# Patient Record
Sex: Male | Born: 1945
Health system: Southern US, Community
[De-identification: ages and names within clinical notes are randomized; demographics above are authoritative.]

## PROBLEM LIST (undated history)

## (undated) DIAGNOSIS — I10 Essential (primary) hypertension: Secondary | ICD-10-CM

## (undated) DIAGNOSIS — N400 Enlarged prostate without lower urinary tract symptoms: Secondary | ICD-10-CM

## (undated) DIAGNOSIS — M1990 Unspecified osteoarthritis, unspecified site: Secondary | ICD-10-CM

## (undated) DIAGNOSIS — K649 Unspecified hemorrhoids: Secondary | ICD-10-CM

## (undated) DIAGNOSIS — K219 Gastro-esophageal reflux disease without esophagitis: Secondary | ICD-10-CM

## (undated) HISTORY — DX: Unspecified osteoarthritis, unspecified site: M19.90

## (undated) HISTORY — DX: Benign prostatic hyperplasia without lower urinary tract symptoms: N40.0

## (undated) HISTORY — DX: Essential (primary) hypertension: I10

## (undated) HISTORY — DX: Gastro-esophageal reflux disease without esophagitis: K21.9

## (undated) HISTORY — PX: EYE SURGERY: SHX253

## (undated) HISTORY — DX: Unspecified hemorrhoids: K64.9

---

## 2003-02-20 LAB — HM COLONOSCOPY

## 2007-09-02 ENCOUNTER — Ambulatory Visit: Payer: Self-pay | Admitting: Internal Medicine

## 2007-09-02 DIAGNOSIS — K219 Gastro-esophageal reflux disease without esophagitis: Secondary | ICD-10-CM | POA: Insufficient documentation

## 2007-09-03 LAB — CONVERTED CEMR LAB
LDL Cholesterol: 123 mg/dL — ABNORMAL HIGH (ref 0–99)
PSA: 2.11 ng/mL (ref 0.10–4.00)
Total CHOL/HDL Ratio: 6.1
Triglycerides: 101 mg/dL (ref 0–149)

## 2008-12-23 ENCOUNTER — Ambulatory Visit: Payer: Self-pay | Admitting: Internal Medicine

## 2008-12-23 DIAGNOSIS — R079 Chest pain, unspecified: Secondary | ICD-10-CM

## 2010-03-19 LAB — CONVERTED CEMR LAB
AST: 30 units/L (ref 0–37)
BUN: 11 mg/dL (ref 6–23)
Basophils Absolute: 0 10*3/uL (ref 0.0–0.1)
CO2: 32 meq/L (ref 19–32)
Creatinine, Ser: 1.1 mg/dL (ref 0.4–1.5)
GFR calc non Af Amer: 72.16 mL/min (ref >60)
HCT: 43.1 % (ref 39.0–52.0)
Lymphs Abs: 1.3 10*3/uL (ref 0.7–4.0)
Monocytes Absolute: 0.3 10*3/uL (ref 0.1–1.0)
Monocytes Relative: 6.3 % (ref 3.0–12.0)
PSA: 1.98 ng/mL (ref 0.10–4.00)
Platelets: 142 10*3/uL — ABNORMAL LOW (ref 150.0–400.0)
RDW: 11.8 % (ref 11.5–14.6)
Total Bilirubin: 1 mg/dL (ref 0.3–1.2)

## 2010-08-25 ENCOUNTER — Encounter: Payer: Self-pay | Admitting: Internal Medicine

## 2010-08-28 ENCOUNTER — Encounter: Payer: Self-pay | Admitting: Internal Medicine

## 2010-08-28 ENCOUNTER — Ambulatory Visit (INDEPENDENT_AMBULATORY_CARE_PROVIDER_SITE_OTHER): Payer: Managed Care, Other (non HMO) | Admitting: Internal Medicine

## 2010-08-28 DIAGNOSIS — K644 Residual hemorrhoidal skin tags: Secondary | ICD-10-CM | POA: Insufficient documentation

## 2010-08-28 DIAGNOSIS — K219 Gastro-esophageal reflux disease without esophagitis: Secondary | ICD-10-CM

## 2010-08-28 DIAGNOSIS — Z0001 Encounter for general adult medical examination with abnormal findings: Secondary | ICD-10-CM | POA: Insufficient documentation

## 2010-08-28 DIAGNOSIS — R0989 Other specified symptoms and signs involving the circulatory and respiratory systems: Secondary | ICD-10-CM | POA: Insufficient documentation

## 2010-08-28 DIAGNOSIS — Z0000 Encounter for general adult medical examination without abnormal findings: Secondary | ICD-10-CM

## 2010-08-28 DIAGNOSIS — K116 Mucocele of salivary gland: Secondary | ICD-10-CM | POA: Insufficient documentation

## 2010-08-28 DIAGNOSIS — Z2911 Encounter for prophylactic immunotherapy for respiratory syncytial virus (RSV): Secondary | ICD-10-CM

## 2010-08-28 LAB — CBC WITH DIFFERENTIAL/PLATELET
Basophils Relative: 0.6 % (ref 0.0–3.0)
Eosinophils Absolute: 0.1 10*3/uL (ref 0.0–0.7)
HCT: 41.7 % (ref 39.0–52.0)
Hemoglobin: 14.8 g/dL (ref 13.0–17.0)
MCHC: 35.5 g/dL (ref 30.0–36.0)
MCV: 87.1 fl (ref 78.0–100.0)
Monocytes Absolute: 0.4 10*3/uL (ref 0.1–1.0)
Neutro Abs: 3.6 10*3/uL (ref 1.4–7.7)
RBC: 4.79 Mil/uL (ref 4.22–5.81)

## 2010-08-28 LAB — BASIC METABOLIC PANEL
CO2: 31 mEq/L (ref 19–32)
Chloride: 101 mEq/L (ref 96–112)
Potassium: 4.1 mEq/L (ref 3.5–5.1)
Sodium: 139 mEq/L (ref 135–145)

## 2010-08-28 LAB — HEPATIC FUNCTION PANEL
ALT: 18 U/L (ref 0–53)
AST: 22 U/L (ref 0–37)
Total Protein: 7.3 g/dL (ref 6.0–8.3)

## 2010-08-28 LAB — PSA: PSA: 1.25 ng/mL (ref 0.10–4.00)

## 2010-08-28 NOTE — Assessment & Plan Note (Signed)
Will use cream prn

## 2010-08-28 NOTE — Assessment & Plan Note (Signed)
Doesn't feel overly worrisome but given his smoking status and father's history, will check ultrasound

## 2010-08-28 NOTE — Assessment & Plan Note (Signed)
Healthy Good condition  Benign mucus cyst on lip, skin tag and lipoma on buttck Discussed PSA---will do it zostavax given

## 2010-08-28 NOTE — Assessment & Plan Note (Signed)
Uses omperazole prn

## 2010-08-28 NOTE — Progress Notes (Signed)
Subjective:    Patient ID: Jonathon Chavez, male    DOB: 1945-03-18, 65 y.o.   MRN: [id number]  HPI Here with wife Concerned about lump on inside of left lower lip No pain  Has had cyst on buttock for many years  Now he has another one and he wants it checked No pain or discomfort  Concerned about AAA Dad had one He is a former smoker  Regular bowel monvements---once a week or so he may go several times a day Gets hemorrhoids acting up then No laxatives   Current Outpatient Prescriptions on File Prior to Visit  Medication Sig Dispense Refill  . DISCONTD: Multiple Vitamins-Minerals (CENTRUM SILVER PO) Take by mouth daily.          No Known Allergies  Past Medical History  Diagnosis Date  . GERD (gastroesophageal reflux disease)     No past surgical history on file.  Family History  Problem Relation Age of Onset  . Diabetes Maternal Grandmother     History   Social History  . Marital Status: Married    Spouse Name: N/A    Number of Children: 1  . Years of Education: N/A   Occupational History  . truck driver Designer, jewellery   Social History Main Topics  . Smoking status: Former Games developer  . Smokeless tobacco: Never Used  . Alcohol Use: No  . Drug Use: No  . Sexually Active: Not on file   Other Topics Concern  . Not on file   Social History Narrative  . No narrative on file   Review of Systems  Constitutional: Negative for fatigue.       Weight down 10# since last year  HENT: Negative for hearing loss, congestion, rhinorrhea, dental problem and tinnitus.   Eyes: Negative for visual disturbance.       No diplopia or focal vision loss  Respiratory: Negative for cough, chest tightness and shortness of breath.   Cardiovascular: Positive for chest pain. Negative for palpitations and leg swelling.       Has had a couple of spells of chest pain when lying on his chest in bed---better with repositioning Tries to walk regularly  Gastrointestinal: Negative for  nausea, vomiting, abdominal pain, constipation and blood in stool.       Has reflux Uses omeprazole prn or uses tums  Genitourinary: Negative for dysuria, frequency and difficulty urinating.       Nocturia 1-2 No sexual problems  Musculoskeletal: Negative for back pain, joint swelling and arthralgias.  Skin: Negative for rash.       Has the buttock cysts---close to rectum  Neurological: Negative for dizziness, syncope, weakness, light-headedness, numbness and headaches.  Hematological: Negative for adenopathy. Does not bruise/bleed easily.  Psychiatric/Behavioral: Negative for sleep disturbance and dysphoric mood. The patient is not nervous/anxious.        Objective:   Physical Exam  Constitutional: He is oriented to person, place, and time. He appears well-developed and well-nourished. No distress.  HENT:  Head: Normocephalic and atraumatic.  Right Ear: External ear normal.  Left Ear: External ear normal.  Mouth/Throat: Oropharynx is clear and moist. No oropharyngeal exudate.       3-60mm clear mucous cyst on inside of left lower lip  Eyes: Conjunctivae and EOM are normal. Pupils are equal, round, and reactive to light.  Neck: Normal range of motion. Neck supple. No thyromegaly present.  Cardiovascular: Normal rate, regular rhythm, normal heart sounds and intact distal pulses.  Exam reveals  no gallop.   No murmur heard. Pulmonary/Chest: Effort normal and breath sounds normal. No respiratory distress. He has no wheezes. He has no rales.  Abdominal: Soft. There is no tenderness.       Aorta palpable just above umbilicus on right  Genitourinary:       Hemorrhoids visible Small skin tag on right below pilonidal area Apparent lipoma on left buttock--no inflammation  Musculoskeletal: Normal range of motion. He exhibits no edema and no tenderness.  Lymphadenopathy:    He has no cervical adenopathy.  Neurological: He is alert and oriented to person, place, and time. He exhibits normal  muscle tone.       Normal strength and gait  Skin: Skin is warm. No rash noted.  Psychiatric: He has a normal mood and affect. His behavior is normal. Judgment and thought content normal.          Assessment & Plan:

## 2010-09-28 ENCOUNTER — Encounter (INDEPENDENT_AMBULATORY_CARE_PROVIDER_SITE_OTHER): Payer: Managed Care, Other (non HMO) | Admitting: Cardiology

## 2010-09-28 DIAGNOSIS — R0989 Other specified symptoms and signs involving the circulatory and respiratory systems: Secondary | ICD-10-CM

## 2010-09-28 DIAGNOSIS — Z0000 Encounter for general adult medical examination without abnormal findings: Secondary | ICD-10-CM

## 2010-09-29 ENCOUNTER — Encounter: Payer: Self-pay | Admitting: Internal Medicine

## 2012-01-09 ENCOUNTER — Ambulatory Visit (INDEPENDENT_AMBULATORY_CARE_PROVIDER_SITE_OTHER): Payer: Managed Care, Other (non HMO) | Admitting: Internal Medicine

## 2012-01-09 ENCOUNTER — Encounter: Payer: Self-pay | Admitting: Internal Medicine

## 2012-01-09 VITALS — BP 130/70 | HR 55 | Temp 97.5°F | Ht 72.0 in | Wt 188.0 lb

## 2012-01-09 DIAGNOSIS — K219 Gastro-esophageal reflux disease without esophagitis: Secondary | ICD-10-CM

## 2012-01-09 DIAGNOSIS — Z0000 Encounter for general adult medical examination without abnormal findings: Secondary | ICD-10-CM

## 2012-01-09 NOTE — Progress Notes (Signed)
  Subjective:    Patient ID: Jonathon Chavez, male    DOB: 09-10-1945, 66 y.o.   MRN: [id number]  HPI Here for physical He has no concerns No changes in FH or job  Heartburn has been quiet No longer using PPI. Will occasionally use alka seltzer (1-2 per month)  No current outpatient prescriptions on file prior to visit.    No Known Allergies  Past Medical History  Diagnosis Date  . GERD (gastroesophageal reflux disease)     No past surgical history on file.  Family History  Problem Relation Age of Onset  . Diabetes Maternal Grandmother     History   Social History  . Marital Status: Married    Spouse Name: N/A    Number of Children: 1  . Years of Education: N/A   Occupational History  . truck driver Designer, jewellery   Social History Main Topics  . Smoking status: Former Games developer  . Smokeless tobacco: Never Used  . Alcohol Use: No  . Drug Use: No  . Sexually Active: Not on file   Other Topics Concern  . Not on file   Social History Narrative  . No narrative on file   Review of Systems  Constitutional: Negative for fatigue and unexpected weight change.       No regular exercise---occ walking. Discussed Always wears seat belt  HENT: Negative for hearing loss, congestion, rhinorrhea, dental problem and tinnitus.        Regular with dentist  Eyes: Negative for visual disturbance.       No diplopia or unilateral vision loss  Respiratory: Negative for cough, chest tightness and shortness of breath.   Cardiovascular: Negative for chest pain, palpitations and leg swelling.  Gastrointestinal: Negative for nausea, vomiting, abdominal pain, constipation and blood in stool.  Genitourinary: Negative for urgency, frequency and difficulty urinating.       No sexual problems  Musculoskeletal: Negative for back pain, joint swelling and arthralgias.  Skin: Negative for rash.       No suspicious areas  Neurological: Negative for dizziness, syncope, weakness,  light-headedness, numbness and headaches.  Hematological: Negative for adenopathy. Does not bruise/bleed easily.  Psychiatric/Behavioral: Negative for sleep disturbance and dysphoric mood. The patient is not nervous/anxious.        Objective:   Physical Exam  Constitutional: He is oriented to person, place, and time. He appears well-developed and well-nourished. No distress.  HENT:  Head: Normocephalic and atraumatic.  Right Ear: External ear normal.  Left Ear: External ear normal.  Mouth/Throat: Oropharynx is clear and moist. No oropharyngeal exudate.  Eyes: Conjunctivae normal and EOM are normal. Pupils are equal, round, and reactive to light.  Neck: Normal range of motion. Neck supple. No thyromegaly present.  Cardiovascular: Normal rate, regular rhythm, normal heart sounds and intact distal pulses.  Exam reveals no gallop.   No murmur heard. Pulmonary/Chest: Effort normal and breath sounds normal. No respiratory distress. He has no wheezes. He has no rales.  Abdominal: Soft. There is no tenderness.  Musculoskeletal: Normal range of motion. He exhibits no edema and no tenderness.  Lymphadenopathy:    He has no cervical adenopathy.  Neurological: He is alert and oriented to person, place, and time.  Skin: No rash noted. No erythema.  Psychiatric: He has a normal mood and affect. His behavior is normal.          Assessment & Plan:

## 2012-01-09 NOTE — Assessment & Plan Note (Signed)
Healthy Discussed increased exercise Had insurance exam and blood work--all fine. He will get me a copy Had PSA

## 2012-01-09 NOTE — Assessment & Plan Note (Signed)
Improved Not on regular meds

## 2012-03-18 ENCOUNTER — Telehealth: Payer: Self-pay | Admitting: Internal Medicine

## 2012-03-18 ENCOUNTER — Encounter: Payer: Self-pay | Admitting: Internal Medicine

## 2012-03-18 ENCOUNTER — Encounter: Payer: Self-pay | Admitting: *Deleted

## 2012-03-18 ENCOUNTER — Ambulatory Visit (INDEPENDENT_AMBULATORY_CARE_PROVIDER_SITE_OTHER): Payer: Managed Care, Other (non HMO) | Admitting: Internal Medicine

## 2012-03-18 VITALS — BP 142/70 | HR 82 | Temp 98.1°F | Wt 189.0 lb

## 2012-03-18 DIAGNOSIS — J111 Influenza due to unidentified influenza virus with other respiratory manifestations: Secondary | ICD-10-CM

## 2012-03-18 NOTE — Telephone Encounter (Signed)
Patient Information:  Caller Name: Randa Spike  Phone: [phone]  Patient: Jonathon Chavez, Jonathon Chavez  Gender: Male  DOB: 1945-05-26  Age: 67 Years  PCP: Tillman Abide [facility])  Office Follow Up:  Does the office need to follow up with this patient?: No  Instructions For The Office: N/A   Symptoms  Reason For Call & Symptoms: Reports body aches, productive cough, nasal congestion, fever, sore throat. Patient has missed work because of this illness.  Reviewed Health History In EMR: Yes  Reviewed Medications In EMR: Yes  Reviewed Allergies In EMR: Yes  Reviewed Surgeries / Procedures: Yes  Date of Onset of Symptoms: 03/14/2012  Treatments Tried: Ibuprofen, Alkaseltzer Cold Plus  Treatments Tried Worked: Yes  Any Fever: Yes  Fever Taken: Tactile  Fever Time Of Reading: 08:32:50  Fever Last Reading: N/A  Guideline(s) Used:  Influenza - Seasonal  Disposition Per Guideline:   See Today in Office  Reason For Disposition Reached:   Using nasal washes and pain medicine > 24 hours and sinus pain (lower forehead, cheekbone, or eye) persists  Advice Given:  Call Back If:  You become short of breath or worse.  Appointment Scheduled:  03/18/2012 09:15:00 (Scheduled out of order for travel time per wife) Appointment Scheduled Provider:  Tillman Abide [facility])

## 2012-03-18 NOTE — Telephone Encounter (Signed)
See OV note.  

## 2012-03-18 NOTE — Progress Notes (Signed)
  Subjective:    Patient ID: Jonathon Chavez, male    DOB: 06-21-1945, 67 y.o.   MRN: [id number]  HPI Here with wife Had to call in today--needs note Started feeling sick 4 days ago Worked sick 1/25 but came home and had to go to bed 2 days in bed since  No clear fever but gets hot and cold Bad body aches-- ibuprofen not clearly helpful Very sore throat Some cough-- dry No SOB No ear pain Some headaches  Tried alka seltzer cold med---not sure if it is doing any good Halls throat lozenges  No current outpatient prescriptions on file prior to visit.    No Known Allergies  Past Medical History  Diagnosis Date  . GERD (gastroesophageal reflux disease)     No past surgical history on file.  Family History  Problem Relation Age of Onset  . Diabetes Maternal Grandmother     History   Social History  . Marital Status: Married    Spouse Name: N/A    Number of Children: 1  . Years of Education: N/A   Occupational History  . truck driver Designer, jewellery   Social History Main Topics  . Smoking status: Former Games developer  . Smokeless tobacco: Never Used  . Alcohol Use: No  . Drug Use: No  . Sexually Active: Not on file   Other Topics Concern  . Not on file   Social History Narrative  . No narrative on file   Review of Systems No vomiting or diarrhea Appetite is off but is eating some No rash    Objective:   Physical Exam  Constitutional: He appears well-developed and well-nourished. No distress.  HENT:  Mouth/Throat: Oropharynx is clear and moist. No oropharyngeal exudate.       No sinus tenderness TMs normal Moderate nasal inflammation  Neck: Normal range of motion. Neck supple.       Non tender cervical nodes bilaterally  Pulmonary/Chest: Effort normal and breath sounds normal. No respiratory distress. He has no wheezes. He has no rales.  Lymphadenopathy:    He has cervical adenopathy.  Skin: No rash noted.          Assessment & Plan:

## 2012-03-18 NOTE — Assessment & Plan Note (Signed)
Fairly classic case Discussed supportive care----ibuprofen, honey, Vicks tamiflu not indicated Work note done

## 2012-09-19 HISTORY — PX: CHOLECYSTECTOMY: SHX55

## 2012-10-16 ENCOUNTER — Inpatient Hospital Stay: Payer: Self-pay | Admitting: Surgery

## 2012-10-16 LAB — COMPREHENSIVE METABOLIC PANEL
Albumin: 4.1 g/dL (ref 3.4–5.0)
Anion Gap: 7 (ref 7–16)
Bilirubin,Total: 0.7 mg/dL (ref 0.2–1.0)
Chloride: 103 mmol/L (ref 98–107)
Co2: 28 mmol/L (ref 21–32)
Creatinine: 1 mg/dL (ref 0.60–1.30)
EGFR (African American): >60
EGFR (Non-African Amer.): >60
Glucose: 144 mg/dL — ABNORMAL HIGH (ref 65–99)
Potassium: 4 mmol/L (ref 3.5–5.1)
SGOT(AST): 21 U/L (ref 15–37)
SGPT (ALT): 28 U/L (ref 12–78)
Sodium: 138 mmol/L (ref 136–145)
Total Protein: 7.7 g/dL (ref 6.4–8.2)

## 2012-10-16 LAB — URINALYSIS, COMPLETE
Bacteria: NONE SEEN
Bilirubin,UR: NEGATIVE
Leukocyte Esterase: NEGATIVE
Nitrite: NEGATIVE
Ph: 6 (ref 4.5–8.0)
Protein: NEGATIVE
Specific Gravity: 1.005 (ref 1.003–1.030)
Squamous Epithelial: NONE SEEN
WBC UR: 1 /HPF (ref 0–5)

## 2012-10-16 LAB — CBC WITH DIFFERENTIAL/PLATELET
Basophil #: 0 10*3/uL (ref 0.0–0.1)
Eosinophil #: 0.1 10*3/uL (ref 0.0–0.7)
HCT: 42.4 % (ref 40.0–52.0)
Lymphocyte #: 1.5 10*3/uL (ref 1.0–3.6)
Lymphocyte %: 18.2 %
MCV: 83 fL (ref 80–100)
Monocyte #: 0.5 x10 3/mm (ref 0.2–1.0)
Monocyte %: 6 %
Neutrophil %: 74.2 %
RBC: 5.12 10*6/uL (ref 4.40–5.90)
WBC: 8.2 10*3/uL (ref 3.8–10.6)

## 2012-10-16 LAB — PROTIME-INR
INR: 1
Prothrombin Time: 13.6 secs (ref 11.5–14.7)

## 2012-10-16 LAB — CK TOTAL AND CKMB (NOT AT ARMC): CK, Total: 105 U/L (ref 35–232)

## 2012-10-22 LAB — PATHOLOGY REPORT

## 2013-02-06 ENCOUNTER — Encounter: Payer: Self-pay | Admitting: Internal Medicine

## 2013-02-06 ENCOUNTER — Ambulatory Visit (INDEPENDENT_AMBULATORY_CARE_PROVIDER_SITE_OTHER): Payer: Managed Care, Other (non HMO) | Admitting: Internal Medicine

## 2013-02-06 VITALS — BP 120/80 | HR 79 | Temp 97.8°F | Ht 72.0 in | Wt 190.0 lb

## 2013-02-06 DIAGNOSIS — Z23 Encounter for immunization: Secondary | ICD-10-CM

## 2013-02-06 DIAGNOSIS — Z0000 Encounter for general adult medical examination without abnormal findings: Secondary | ICD-10-CM

## 2013-02-06 DIAGNOSIS — Z1211 Encounter for screening for malignant neoplasm of colon: Secondary | ICD-10-CM

## 2013-02-06 NOTE — Addendum Note (Signed)
Addended by: Sueanne Margarita on: 02/06/2013 10:20 AM   Modules accepted: Orders

## 2013-02-06 NOTE — Assessment & Plan Note (Addendum)
Healthy Counseling done Will give pneumovax, Tdap Due for colonoscopy No labs this year--PSA and glucose normal last year Cholesterol levels are good

## 2013-02-06 NOTE — Patient Instructions (Signed)
Exercise to Stay Healthy Exercise helps you become and stay healthy. EXERCISE IDEAS AND TIPS Choose exercises that:  You enjoy.  Fit into your day. You do not need to exercise really hard to be healthy. You can do exercises at a slow or medium level and stay healthy. You can:  Stretch before and after working out.  Try yoga, Pilates, or tai chi.  Lift weights.  Walk fast, swim, jog, run, climb stairs, bicycle, dance, or rollerskate.  Take aerobic classes. Exercises that burn about 150 calories:  Running 1  miles in 15 minutes.  Playing volleyball for 45 to 60 minutes.  Washing and waxing a car for 45 to 60 minutes.  Playing touch football for 45 minutes.  Walking 1  miles in 35 minutes.  Pushing a stroller 1  miles in 30 minutes.  Playing basketball for 30 minutes.  Raking leaves for 30 minutes.  Bicycling 5 miles in 30 minutes.  Walking 2 miles in 30 minutes.  Dancing for 30 minutes.  Shoveling snow for 15 minutes.  Swimming laps for 20 minutes.  Walking up stairs for 15 minutes.  Bicycling 4 miles in 15 minutes.  Gardening for 30 to 45 minutes.  Jumping rope for 15 minutes.  Washing windows or floors for 45 to 60 minutes. Document Released: 03/10/2010 Document Revised: 04/30/2011 Document Reviewed: 03/10/2010 [employer] Patient Information 2014 [employer].  

## 2013-02-06 NOTE — Progress Notes (Signed)
Pre-visit discussion using our clinic review tool. No additional management support is needed unless otherwise documented below in the visit note.  

## 2013-02-06 NOTE — Progress Notes (Signed)
Subjective:    Patient ID: Jonathon Chavez, male    DOB: 1945/06/01, 67 y.o.   MRN: [id number]  HPI Here for physical Wife is here No new concerns Did have cholecystectomy in August--had slight post op bleeding when he pushed things Still working  No current outpatient prescriptions on file prior to visit.   No current facility-administered medications on file prior to visit.    No Known Allergies  Past Medical History  Diagnosis Date  . GERD (gastroesophageal reflux disease)     Past Surgical History  Procedure Laterality Date  . Cholecystectomy  8/14    Dr Michela Pitcher    Family History  Problem Relation Age of Onset  . Diabetes Maternal Grandmother     History   Social History  . Marital Status: Married    Spouse Name: N/A    Number of Children: 1  . Years of Education: N/A   Occupational History  . truck driver Designer, jewellery   Social History Main Topics  . Smoking status: Former Games developer  . Smokeless tobacco: Never Used  . Alcohol Use: No  . Drug Use: No  . Sexual Activity: Not on file   Other Topics Concern  . Not on file   Social History Narrative   No living will   Wife would be health care POA   Would accept resuscitation--no prolonged ventilation   Probably no tube feeds if cognitively unaware   Review of Systems  Constitutional: Negative for fatigue and unexpected weight change.       Wears seat belt  HENT: Negative for congestion, dental problem, hearing loss, rhinorrhea and tinnitus.        Regular with dentist  Eyes: Negative for visual disturbance.       No diplopia or unilateral vision loss  Respiratory: Negative for cough, chest tightness and shortness of breath.   Cardiovascular: Negative for chest pain, palpitations and leg swelling.       No regular exercise  Gastrointestinal: Negative for nausea, vomiting, abdominal pain and constipation.       Hemorrhoidal bleed occasionally Occasional indigestion-- uses tums or alka seltzer.  Better since cholecystectomy  Endocrine: Negative for cold intolerance and heat intolerance.  Genitourinary: Positive for urgency and frequency. Negative for dysuria and difficulty urinating.       Nocturia x 2-3 Some increased frquency in day No sexual problems  Musculoskeletal: Positive for arthralgias and back pain. Negative for joint swelling.       Occasional low back or right shoulder pain---uses aleve prn  Skin: Negative for rash.       No suspicious lesions  Allergic/Immunologic: Negative for environmental allergies and immunocompromised state.  Neurological: Negative for dizziness, syncope, weakness, light-headedness, numbness and headaches.  Hematological: Negative for adenopathy. Does not bruise/bleed easily.  Psychiatric/Behavioral: Negative for sleep disturbance and dysphoric mood. The patient is not nervous/anxious.        Objective:   Physical Exam  Constitutional: He is oriented to person, place, and time. He appears well-developed and well-nourished. No distress.  HENT:  Head: Normocephalic and atraumatic.  Right Ear: External ear normal.  Left Ear: External ear normal.  Mouth/Throat: Oropharynx is clear and moist. No oropharyngeal exudate.  Eyes: Conjunctivae and EOM are normal. Pupils are equal, round, and reactive to light.  Neck: Normal range of motion. Neck supple. No thyromegaly present.  Cardiovascular: Normal rate, regular rhythm, normal heart sounds and intact distal pulses.  Exam reveals no gallop.   No murmur  heard. Pulmonary/Chest: Effort normal and breath sounds normal. No respiratory distress. He has no wheezes. He has no rales.  Abdominal: Soft. There is no tenderness.  Musculoskeletal: He exhibits no edema and no tenderness.  Lymphadenopathy:    He has no cervical adenopathy.  Neurological: He is alert and oriented to person, place, and time.  Skin: No rash noted. No erythema.  Psychiatric: He has a normal mood and affect. His behavior is normal.            Assessment & Plan:

## 2013-02-20 ENCOUNTER — Encounter: Payer: Self-pay | Admitting: Internal Medicine

## 2013-03-22 HISTORY — PX: COLONOSCOPY: SHX174

## 2013-03-25 ENCOUNTER — Ambulatory Visit (AMBULATORY_SURGERY_CENTER): Payer: Managed Care, Other (non HMO)

## 2013-03-25 VITALS — Ht 73.5 in | Wt 197.2 lb

## 2013-03-25 DIAGNOSIS — Z1211 Encounter for screening for malignant neoplasm of colon: Secondary | ICD-10-CM

## 2013-03-25 MED ORDER — MOVIPREP 100 G PO SOLR: ORAL | Status: DC

## 2013-03-27 ENCOUNTER — Encounter: Payer: Self-pay | Admitting: Internal Medicine

## 2013-04-08 ENCOUNTER — Encounter: Payer: Self-pay | Admitting: Internal Medicine

## 2013-04-08 ENCOUNTER — Ambulatory Visit (AMBULATORY_SURGERY_CENTER): Payer: Managed Care, Other (non HMO) | Admitting: Internal Medicine

## 2013-04-08 VITALS — BP 130/90 | HR 76 | Temp 97.2°F | Resp 19 | Ht 73.5 in | Wt 197.0 lb

## 2013-04-08 DIAGNOSIS — D126 Benign neoplasm of colon, unspecified: Secondary | ICD-10-CM

## 2013-04-08 DIAGNOSIS — Z1211 Encounter for screening for malignant neoplasm of colon: Secondary | ICD-10-CM

## 2013-04-08 MED ORDER — SODIUM CHLORIDE 0.9 % IV SOLN: 500.0000 mL | INTRAVENOUS | Status: DC

## 2013-04-08 NOTE — Patient Instructions (Signed)
YOU HAD AN ENDOSCOPIC PROCEDURE TODAY AT THE [facility]: Refer to the procedure report that was given to you for any specific questions about what was found during the examination.  If the procedure report does not answer your questions, please call your gastroenterologist to clarify.  If you requested that your care partner not be given the details of your procedure findings, then the procedure report has been included in a sealed envelope for you to review at your convenience later.  YOU SHOULD EXPECT: Some feelings of bloating in the abdomen. Passage of more gas than usual.  Walking can help get rid of the air that was put into your GI tract during the procedure and reduce the bloating. If you had a lower endoscopy (such as a colonoscopy or flexible sigmoidoscopy) you may notice spotting of blood in your stool or on the toilet paper. If you underwent a bowel prep for your procedure, then you may not have a normal bowel movement for a few days.  DIET: Your first meal following the procedure should be a light meal and then it is ok to progress to your normal diet.  A half-sandwich or bowl of soup is an example of a good first meal.  Heavy or fried foods are harder to digest and may make you feel nauseous or bloated.  Likewise meals heavy in dairy and vegetables can cause extra gas to form and this can also increase the bloating.  Drink plenty of fluids but you should avoid alcoholic beverages for 24 hours.  ACTIVITY: Your care partner should take you home directly after the procedure.  You should plan to take it easy, moving slowly for the rest of the day.  You can resume normal activity the day after the procedure however you should NOT DRIVE or use heavy machinery for 24 hours (because of the sedation medicines used during the test).    SYMPTOMS TO REPORT IMMEDIATELY: A gastroenterologist can be reached at any hour.  During normal business hours, 8:30 AM to 5:00 PM Monday through Friday,  call [phone].  After hours and on weekends, please call the GI answering service at [phone] who will take a message and have the physician on call contact you.   Following lower endoscopy (colonoscopy or flexible sigmoidoscopy):  Excessive amounts of blood in the stool  Significant tenderness or worsening of abdominal pains  Swelling of the abdomen that is new, acute  Fever of 100F or higher   FOLLOW UP: If any biopsies were taken you will be contacted by phone or by letter within the next 1-3 weeks.  Call your gastroenterologist if you have not heard about the biopsies in 3 weeks.  Our staff will call the home number listed on your records the next business day following your procedure to check on you and address any questions or concerns that you may have at that time regarding the information given to you following your procedure. This is a courtesy call and so if there is no answer at the home number and we have not heard from you through the emergency physician on call, we will assume that you have returned to your regular daily activities without incident.  SIGNATURES/CONFIDENTIALITY: You and/or your care partner have signed paperwork which will be entered into your electronic medical record.  These signatures attest to the fact that that the information above on your After Visit Summary has been reviewed and is understood.  Full responsibility of the confidentiality of   this discharge information lies with you and/or your care-partner.   Resume medications. Information given on polyps,diverticulosis and high fiber diet with discharge instructions. 

## 2013-04-08 NOTE — Progress Notes (Signed)
Called to room to assist during endoscopic procedure.  Patient ID and intended procedure confirmed with present staff. Received instructions for my participation in the procedure from the performing physician.  

## 2013-04-08 NOTE — Progress Notes (Signed)
Report to pacu rn, vss, bbs=clear 

## 2013-04-08 NOTE — Op Note (Signed)
[facility]  [address] [place], [id number]   COLONOSCOPY PROCEDURE REPORT  PATIENT: Jonathon Chavez, Poss  MR#: [id number] BIRTHDATE: Oct 07, 1945 , 67  yrs. old GENDER: Male ENDOSCOPIST: Jerene Bears, MD REFERRED [id number] Silvio Pate, M.D. PROCEDURE DATE:  04/08/2013 PROCEDURE:   Colonoscopy with cold biopsy polypectomy First Screening Colonoscopy - Avg.  risk and is 50 yrs.  old or older - No.  Prior Negative Screening - Now for repeat screening. 10 or more years since last screening  History of Adenoma - Now for follow-up colonoscopy & has been > or = to 3 yrs.  N/A  Polyps Removed Today? Yes. ASA CLASS:   Class II INDICATIONS:average risk screening and Last colonoscopy performed 10 years ago. MEDICATIONS: MAC sedation, administered by CRNA and propofol (Diprivan) 300mg  IV  DESCRIPTION OF PROCEDURE:   After the risks benefits and alternatives of the procedure were thoroughly explained, informed consent was obtained.  A digital rectal exam revealed no rectal mass.   The LB [id number]  endoscope was introduced through the anus and advanced to the cecum, which was identified by both the appendix and ileocecal valve. No adverse events experienced. The quality of the prep was good, using MoviPrep  The instrument was then slowly withdrawn as the colon was fully examined.   COLON FINDINGS: A sessile polyp measuring 4 mm in size was found in the ascending colon.  A polypectomy was performed with cold forceps.  The resection was complete and the polyp tissue was completely retrieved.   There was mild diverticulosis noted in the ascending colon and moderate in the descending colon, and sigmoid colon with associated muscular hypertrophy.  Retroflexed views revealed internal hemorrhoids. The time to cecum=3 minutes 45 seconds.  Withdrawal time=10 minutes 51 seconds.  The scope was withdrawn and the procedure completed. COMPLICATIONS: There were no  complications.  ENDOSCOPIC IMPRESSION: 1.   Sessile polyp measuring 4 mm in size was found in the ascending colon; polypectomy was performed with cold forceps 2.   There was moderate diverticulosis noted in the ascending colon, descending colon, and sigmoid colon  RECOMMENDATIONS: 1.  Await pathology results 2.  High fiber diet 3.  If the polyp removed today is proven to be an adenomatous (pre-cancerous) polyp, you will need a repeat colonoscopy in 5 years.  Otherwise you should continue to follow colorectal cancer screening guidelines for "routine risk" patients with colonoscopy in 10 years.  You will receive a letter within 1-2 weeks with the results of your biopsy as well as final recommendations.  Please call my office if you have not received a letter after 3 weeks.   eSigned:  Jerene Bears, MD 04/08/2013 12:27 PM   cc: The Patient and Venia Carbon, MD   PATIENT NAME:  [name], [name] MR#: [id number]

## 2013-04-09 ENCOUNTER — Telehealth: Payer: Self-pay

## 2013-04-09 NOTE — Telephone Encounter (Signed)
  Follow up Call-  Call back number 04/08/2013  Post procedure Call Back phone  # [phone]  Permission to leave phone message Yes     Patient questions:  Do you have a fever, pain , or abdominal swelling? no Pain Score  0 *  Have you tolerated food without any problems? yes  Have you been able to return to your normal activities? yes  Do you have any questions about your discharge instructions: Diet   no Medications  no Follow up visit  no  Do you have questions or concerns about your Care? no  Actions: * If pain score is 4 or above: No action needed, pain <4.

## 2013-04-15 ENCOUNTER — Encounter: Payer: Self-pay | Admitting: Internal Medicine

## 2014-02-08 ENCOUNTER — Ambulatory Visit (INDEPENDENT_AMBULATORY_CARE_PROVIDER_SITE_OTHER): Payer: Managed Care, Other (non HMO) | Admitting: Internal Medicine

## 2014-02-08 ENCOUNTER — Encounter: Payer: Self-pay | Admitting: Internal Medicine

## 2014-02-08 VITALS — BP 130/80 | HR 65 | Temp 98.2°F | Ht 74.0 in | Wt 192.0 lb

## 2014-02-08 DIAGNOSIS — K219 Gastro-esophageal reflux disease without esophagitis: Secondary | ICD-10-CM

## 2014-02-08 DIAGNOSIS — Z23 Encounter for immunization: Secondary | ICD-10-CM

## 2014-02-08 DIAGNOSIS — N400 Enlarged prostate without lower urinary tract symptoms: Secondary | ICD-10-CM

## 2014-02-08 DIAGNOSIS — Z0000 Encounter for general adult medical examination without abnormal findings: Secondary | ICD-10-CM

## 2014-02-08 LAB — LIPID PANEL
CHOLESTEROL: 178 mg/dL (ref 0–200)
HDL: 28.3 mg/dL — AB (ref >39.00)
LDL CALC: 127 mg/dL — AB (ref 0–99)
NonHDL: 149.7
Total CHOL/HDL Ratio: 6
Triglycerides: 114 mg/dL (ref 0.0–149.0)
VLDL: 22.8 mg/dL (ref 0.0–40.0)

## 2014-02-08 LAB — GLUCOSE, RANDOM: Glucose, Bld: 100 mg/dL — ABNORMAL HIGH (ref 70–99)

## 2014-02-08 NOTE — Progress Notes (Signed)
Subjective:    Patient ID: Jonathon Chavez, male    DOB: 05-Jul-1945, 68 y.o.   MRN: [id number]  HPI Here for physical Still insured through company---full time worker still  Dad has Alzheimers---rotates with 2 brothers (someone is always there with him)   Wants to get PSA---family history of other cancers Needs blood work for his company also  No other concerns  Current Outpatient Prescriptions on File Prior to Visit  Medication Sig Dispense Refill  . naproxen sodium (ANAPROX) 220 MG tablet Take 220 mg by mouth as needed.     No current facility-administered medications on file prior to visit.    No Known Allergies  Past Medical History  Diagnosis Date  . GERD (gastroesophageal reflux disease)   . Hemorrhoids     Past Surgical History  Procedure Laterality Date  . Cholecystectomy  8/14    Dr Pat Patrick    Family History  Problem Relation Age of Onset  . Diabetes Maternal Grandmother   . Dementia Father   . Cancer Mother     ovarian cancer  . Cancer Brother     throat cancer  . Cancer Brother     prostate cancer    History   Social History  . Marital Status: Married    Spouse Name: N/A    Number of Children: 1  . Years of Education: N/A   Occupational History  . truck driver Public house manager   Social History Main Topics  . Smoking status: Former Smoker    Quit date: 03/25/1973  . Smokeless tobacco: Never Used  . Alcohol Use: No  . Drug Use: No  . Sexual Activity: Not on file   Other Topics Concern  . Not on file   Social History Narrative   No living will   Wife would be health care POA   Would accept resuscitation--no prolonged ventilation   Probably no tube feeds if cognitively unaware   Review of Systems  Constitutional: Negative for fatigue and unexpected weight change.       Wears seat belt  HENT: Negative for dental problem, hearing loss and tinnitus.        Overdue for dentist  Eyes: Negative for visual disturbance.       No diplopia or  unilateral vision loss  Respiratory: Negative for cough, chest tightness and shortness of breath.   Cardiovascular: Negative for chest pain, palpitations and leg swelling.  Gastrointestinal: Negative for nausea and vomiting.       Occasional indigestion---tums prn (if he eats before going to bed) Used H2 blocker in past  Endocrine: Negative for cold intolerance and heat intolerance.  Genitourinary: Positive for frequency. Negative for urgency.       Every 2 hours nocturia Some daytime frequency No major urgency No sexual problems  Musculoskeletal: Positive for arthralgias. Negative for back pain and joint swelling.       Joints ache in cold weather--aleve helps (but causes hemorrhoids to bleed)  Skin: Negative for rash.       No suspicious lesions  Allergic/Immunologic: Negative for environmental allergies and immunocompromised state.  Neurological: Negative for dizziness, syncope, weakness, light-headedness, numbness and headaches.  Hematological: Negative for adenopathy. Does not bruise/bleed easily.  Psychiatric/Behavioral: Negative for sleep disturbance and dysphoric mood. The patient is not nervous/anxious.        Objective:   Physical Exam  Constitutional: He is oriented to person, place, and time. He appears well-developed and well-nourished. No distress.  HENT:  Head:  Normocephalic and atraumatic.  Right Ear: External ear normal.  Left Ear: External ear normal.  Mouth/Throat: Oropharynx is clear and moist. No oropharyngeal exudate.  Eyes: Conjunctivae and EOM are normal. Pupils are equal, round, and reactive to light.  Neck: Normal range of motion. Neck supple. No thyromegaly present.  Cardiovascular: Normal rate, regular rhythm, normal heart sounds and intact distal pulses.  Exam reveals no gallop.   No murmur heard. Pulmonary/Chest: Effort normal and breath sounds normal. No respiratory distress. He has no wheezes. He has no rales.  Abdominal: Soft. There is no  tenderness.  Musculoskeletal: He exhibits no edema or tenderness.  Lymphadenopathy:    He has no cervical adenopathy.  Neurological: He is alert and oriented to person, place, and time.  Skin: No rash noted. No erythema.  Psychiatric: He has a normal mood and affect. His behavior is normal.          Assessment & Plan:

## 2014-02-08 NOTE — Assessment & Plan Note (Signed)
Doing well Discussed regular exercise prevnar today Discussed PSA--will check Check glucose and lipid

## 2014-02-08 NOTE — Addendum Note (Signed)
Addended by: Despina Hidden on: 02/08/2014 03:50 PM   Modules accepted: Orders

## 2014-02-08 NOTE — Patient Instructions (Signed)
If you need the Tums more often, consider using ranitidine or famotidine (zantac and pepcid over the counter) daily at bedtime

## 2014-02-08 NOTE — Progress Notes (Signed)
Pre visit review using our clinic review tool, if applicable. No additional management support is needed unless otherwise documented below in the visit note. 

## 2014-02-08 NOTE — Assessment & Plan Note (Signed)
Uses tums at times Discussed H2 blocker if more symptoms

## 2014-02-08 NOTE — Assessment & Plan Note (Signed)
Bothersome but not bad enough for meds

## 2014-02-09 LAB — PSA: PSA: 2.5 ng/mL (ref 0.10–4.00)

## 2014-02-11 ENCOUNTER — Encounter: Payer: Self-pay | Admitting: *Deleted

## 2014-06-11 NOTE — Op Note (Signed)
PATIENT NAME:  Jonathon Chavez, Jonathon Chavez MR#:  [id number] DATE OF BIRTH:  1945/09/27  DATE OF PROCEDURE:  10/16/2012  PREOPERATIVE DIAGNOSIS: Acute cholecystitis.   POSTOPERATIVE DIAGNOSIS: Acute cholecystitis.  PROCEDURE PERFORMED: Laparoscopic cholecystectomy.   ANESTHESIA: General.   ESTIMATED BLOOD LOSS: 25 mL.   COMPLICATIONS: None.   SPECIMEN: Gallbladder.   INDICATION FOR SURGERY: [name] is a pleasant 69 year old male who presents with acute onset epigastric right upper quadrant pain and ultrasound which was concerning for a stone at the neck of the gallbladder, he thus was brought to the operating room suite for laparoscopic cholecystectomy.   DETAILS OF PROCEDURE: Is as follows:  Informed consent was obtained. [name]. He was placed supine on the operating room table. He was induced. Endotracheal tube was placed, general anesthesia was administered. His abdomen was then prepped and draped in standard surgical fashion. A timeout was then performed correctly identifying patient name, operative site and procedure to be performed. A supraumbilical incision was made. This was deepened down to the fascia. The fascia was incised. The peritoneum was entered. Two stay sutures were placed with 0 Vicryl suture through the fasciotomy. Hasson trocar was placed in the abdomen. The abdomen was insufflated. An 11 mm epigastric and two 5 mm subcostal trocars were placed at the midclavicular and anterior axillary line. The gallbladder was then retracted over the dome of the liver. The cystic artery and cystic duct were dissected out. Critical view was obtained. There were clipped 3 times each and ligated. The gallbladder was then taken off the gallbladder fossa using hook electrocautery. It was then taken out through an Endo Catch bag through the supraumbilical port site. The gallbladder fossa was then examined. It was made hemostatic. The abdomen was irrigated. I then  looked at the gallbladder fossa again and ensured that it was hemostatic. I then removed all trocars under direct visualization. The supraumbilical incision was closed with a figure-of-eight 0 Vicryl. The skin sites were then closed with an inverted deep dermal 4-0 Monocryl. Steri-Strips, Telfa gauze and Tegaderm were then used to complete the dressing. [name] was then awoken, extubated and brought to the postanesthesia care unit. There were no immediate complications. The needle, sponge and instrument count was correct at the end of the procedure.    ____________________________ Glena Norfolk. , MD cal:dmm D: 10/17/2012 09:47:05 ET T: 10/17/2012 10:16:01 ET JOB#: [id number]  cc: Harrell Gave A. , MD, <Dictator> Floyde Parkins MD ELECTRONICALLY SIGNED 10/24/2012 8:12

## 2014-06-11 NOTE — H&P (Signed)
PATIENT NAME:  Jonathon Chavez, Jonathon Chavez MR#:  [id number] DATE OF BIRTH:  October 09, 1945  DATE OF ADMISSION:  10/16/2012  CHIEF COMPLAINT:  Epigastric pain.   HISTORY OF PRESENT ILLNESS:  This is a patient with a history of known reflux disease who does not take any medications for it who presented with the acute onset of abdominal pain in the epigastrium, started last night after eating a fatty meal. He has nausea and has vomited one time. He has been medicated in the ER for pain and his pain is better, but still present. He has no radiation of his pain to his shoulder or back.   It is not clear if he has had pain similar to this as he has reflux symptoms quite often.   Denies fevers, chills, jaundice or acholic stools.   PAST MEDICAL HISTORY:  None with the exception of reflux disease.   PAST SURGICAL HISTORY:  None.   ALLERGIES:  None.   MEDICATIONS:  Currently none, not being medicated for reflux disease routinely.   FAMILY HISTORY:  Multiple members with gallbladder disease.   SOCIAL HISTORY:  The patient is a Administrator. He does not smoke or drink.   REVIEW OF SYSTEMS:  A 10-system review has been performed and negative with the exception of that mentioned in the HPI.   PHYSICAL EXAMINATION:  GENERAL:  Uncomfortable-appearing Caucasian male patient.  HEENT:  No scleral icterus.  NECK:  No palpable neck nodes.  CHEST:  Clear to auscultation.  CARDIAC:  Regular rate and rhythm.  ABDOMEN:  Soft. There is a positive Murphy's sign. He is tender in the right upper quadrant with no hernias noted.  EXTREMITIES:  Without edema.  NEUROLOGIC:  Grossly intact.  INTEGUMENT:  No jaundice.  VITAL SIGNS:  Stable. He is afebrile.  An ultrasound and a CT scan as reported by the Emergency Room physician show stones. These will be personally reviewed.   LABORATORY, DIAGNOSTIC AND RADIOLOGICAL DATA:  Laboratory values were reported to me by the Emergency Room physician as being normal white blood cell  count and normal liver function tests. These are currently not available, but will be reviewed personally once they are available.   ASSESSMENT AND PLAN:  This is a patient with likely acute cholecystitis and known gallstones. He also has reflux disease. With these findings it and the unrelenting pain, he has acute cholecystitis and will be admitted to the hospital. The rationale for admission and hydration and starting IV antibiotics has been discussed with he and his family. The options of outpatient management have been discussed and the need for probable laparoscopic cholecystectomy in the next 24 or 48 hours has been discussed. If his pain resolved completely he could be sent home and elective surgery could be performed. The procedure itself was has been described. The risks of bleeding, infection, recurrence of symptoms, the failure to resolve all of his symptoms, especially with his reflux disease and the risks of conversion to an open procedure, bile duct damage, bile duct leak, retained common bile duct stone, any of which could require further surgery and/or ERCP, stent, and papillotomy were discussed with he and his family. They understood and agreed to proceed.   ____________________________ Jerrol Banana Burt Knack, MD rec:jm D: 10/16/2012 09:42:20 ET T: 10/16/2012 09:53:16 ET JOB#: [id number]  cc: Jerrol Banana. Burt Knack, MD, <Dictator> Florene Glen MD ELECTRONICALLY SIGNED 10/20/2012 19:13

## 2015-02-15 ENCOUNTER — Encounter: Payer: Self-pay | Admitting: Internal Medicine

## 2015-02-15 ENCOUNTER — Ambulatory Visit (INDEPENDENT_AMBULATORY_CARE_PROVIDER_SITE_OTHER): Payer: Managed Care, Other (non HMO) | Admitting: Internal Medicine

## 2015-02-15 VITALS — BP 120/60 | HR 70 | Temp 98.1°F | Ht 74.0 in | Wt 193.0 lb

## 2015-02-15 DIAGNOSIS — N400 Enlarged prostate without lower urinary tract symptoms: Secondary | ICD-10-CM | POA: Diagnosis not present

## 2015-02-15 DIAGNOSIS — Z0000 Encounter for general adult medical examination without abnormal findings: Secondary | ICD-10-CM

## 2015-02-15 DIAGNOSIS — K219 Gastro-esophageal reflux disease without esophagitis: Secondary | ICD-10-CM | POA: Diagnosis not present

## 2015-02-15 LAB — COMPREHENSIVE METABOLIC PANEL
ALBUMIN: 4.2 g/dL (ref 3.5–5.2)
ALK PHOS: 44 U/L (ref 39–117)
ALT: 31 U/L (ref 0–53)
AST: 25 U/L (ref 0–37)
BUN: 16 mg/dL (ref 6–23)
CHLORIDE: 102 meq/L (ref 96–112)
CO2: 32 mEq/L (ref 19–32)
CREATININE: 0.95 mg/dL (ref 0.40–1.50)
Calcium: 9.6 mg/dL (ref 8.4–10.5)
GFR: 83.83 mL/min (ref >60.00)
Glucose, Bld: 103 mg/dL — ABNORMAL HIGH (ref 70–99)
Potassium: 4.3 mEq/L (ref 3.5–5.1)
SODIUM: 140 meq/L (ref 135–145)
Total Bilirubin: 0.7 mg/dL (ref 0.2–1.2)
Total Protein: 7.2 g/dL (ref 6.0–8.3)

## 2015-02-15 LAB — CBC WITH DIFFERENTIAL/PLATELET
BASOS PCT: 0.7 % (ref 0.0–3.0)
Basophils Absolute: 0 10*3/uL (ref 0.0–0.1)
EOS ABS: 0.2 10*3/uL (ref 0.0–0.7)
Eosinophils Relative: 2.9 % (ref 0.0–5.0)
HCT: 45.4 % (ref 39.0–52.0)
Hemoglobin: 15.2 g/dL (ref 13.0–17.0)
Lymphocytes Relative: 29.9 % (ref 12.0–46.0)
Lymphs Abs: 1.6 10*3/uL (ref 0.7–4.0)
MCHC: 33.5 g/dL (ref 30.0–36.0)
MCV: 85.8 fl (ref 78.0–100.0)
MONO ABS: 0.4 10*3/uL (ref 0.1–1.0)
Monocytes Relative: 7.4 % (ref 3.0–12.0)
NEUTROS PCT: 59.1 % (ref 43.0–77.0)
Neutro Abs: 3.1 10*3/uL (ref 1.4–7.7)
Platelets: 161 10*3/uL (ref 150.0–400.0)
RBC: 5.29 Mil/uL (ref 4.22–5.81)
RDW: 13.7 % (ref 11.5–15.5)
WBC: 5.2 10*3/uL (ref 4.0–10.5)

## 2015-02-15 LAB — PSA: PSA: 2.31 ng/mL (ref 0.10–4.00)

## 2015-02-15 NOTE — Assessment & Plan Note (Signed)
Mild symptoms Rx not indicated as yet

## 2015-02-15 NOTE — Progress Notes (Signed)
Subjective:    Patient ID: Jonathon Chavez, male    DOB: 1945/08/04, 69 y.o.   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   No current facility-administered medications on file prior to visit.    No Known Allergies  Past Medical History  Diagnosis Date  . GERD (gastroesophageal reflux disease)   . Hemorrhoids   . BPH (benign prostatic hypertrophy)     Past Surgical History  Procedure Laterality Date  . Cholecystectomy  8/14    Dr Pat Patrick    Family History  Problem Relation Age of Onset  . Diabetes Maternal Grandmother   . Dementia Father   . Cancer Mother     ovarian cancer  . Cancer Brother     throat cancer  . Cancer Brother     prostate cancer    Social History   Social History  . Marital Status: Married    Spouse Name: N/A  . Number of Children: 1  . Years of Education: N/A   Occupational History  . truck driver Public house manager   Social History Main Topics  . Smoking status: Former Smoker    Quit date: 03/25/1973  . Smokeless tobacco: Never Used  . Alcohol Use: No  . Drug Use: No  . Sexual Activity: Not on file   Other Topics Concern  . Not on file   Social History Narrative   No living will   Wife would be  health care POA   Would accept resuscitation--no prolonged ventilation   Probably no tube feeds if cognitively unaware   Review of Systems  Constitutional: Negative for fatigue and unexpected weight change.       Dad needs constant attendance--- him and 2 sibs and now has paid caregiver also Wears seat belt  HENT: Negative for dental problem, hearing loss and tinnitus.        Recent visit to dentist  Eyes: Negative for visual disturbance.       No diplopia or unilateral vision loss  Respiratory: Positive for cough. Negative for chest tightness and shortness of breath.        Cough with resp illness  Cardiovascular: Negative for chest pain, palpitations and leg swelling.  Gastrointestinal: Negative for nausea, vomiting, abdominal pain, constipation and blood in stool.  Endocrine: Negative for cold intolerance and heat intolerance.  Genitourinary: Negative for urgency and difficulty urinating.       No sexual problems  Musculoskeletal: Positive for arthralgias. Negative for back pain and joint swelling.       Various transient joint pains--ibuprofen helps  Skin: Negative for rash.       No suspicious lesions  Allergic/Immunologic: Negative for environmental allergies and immunocompromised  state.  Neurological: Negative for dizziness, syncope, weakness, light-headedness and headaches.  Hematological: Negative for adenopathy. Does not bruise/bleed easily.  Psychiatric/Behavioral: Negative for sleep disturbance and dysphoric mood. The patient is not nervous/anxious.        Objective:   Physical Exam  Constitutional: He is oriented to person, place, and time. He appears well-developed and well-nourished. No distress.  HENT:  Head: Normocephalic and atraumatic.  Right Ear: External ear normal.  Left Ear: External ear normal.  Mouth/Throat: Oropharynx is clear and moist. No oropharyngeal exudate.  Eyes: Conjunctivae and EOM are normal. Pupils are equal, round, and reactive to light.    Neck: Normal range of motion. Neck supple. No thyromegaly present.  Cardiovascular: Normal rate, regular rhythm, normal heart sounds and intact distal pulses.  Exam reveals no gallop.   No murmur heard. Pulmonary/Chest: Effort normal and breath sounds normal. No respiratory distress. He has no wheezes. He has no rales.  Abdominal: Soft. There is no tenderness.  Musculoskeletal: He exhibits no edema or tenderness.  Lymphadenopathy:    He has no cervical adenopathy.  Neurological: He is alert and oriented to person, place, and time.  Skin: No rash noted. No erythema.  Psychiatric: He has a normal mood and affect. His behavior is normal.          Assessment & Plan:

## 2015-02-15 NOTE — Progress Notes (Signed)
Pre visit review using our clinic review tool, if applicable. No additional management support is needed unless otherwise documented below in the visit note. 

## 2015-02-15 NOTE — Patient Instructions (Signed)
Please try ranitidine 150mg  or famotidine 20mg  (both over the counter) before foods that give you heartburn. If you still need tums multiple times a week, you should take one of these every day (like at bedtime).

## 2015-02-15 NOTE — Assessment & Plan Note (Signed)
Needs more regular Rx due to symptoms

## 2015-02-15 NOTE — Assessment & Plan Note (Signed)
Healthy---but not in good shape Discussed regular exercise and weight work PSA at his request--wants to do yearly due to [place] Colon probably due 2020 (polyp in 2015)

## 2015-05-05 ENCOUNTER — Encounter: Payer: Self-pay | Admitting: *Deleted

## 2015-05-05 ENCOUNTER — Ambulatory Visit (INDEPENDENT_AMBULATORY_CARE_PROVIDER_SITE_OTHER): Payer: Managed Care, Other (non HMO) | Admitting: Family Medicine

## 2015-05-05 ENCOUNTER — Encounter: Payer: Self-pay | Admitting: Family Medicine

## 2015-05-05 ENCOUNTER — Telehealth: Payer: Self-pay | Admitting: Internal Medicine

## 2015-05-05 VITALS — BP 150/84 | HR 84 | Temp 98.2°F | Wt 196.5 lb

## 2015-05-05 DIAGNOSIS — R5383 Other fatigue: Secondary | ICD-10-CM | POA: Diagnosis not present

## 2015-05-05 DIAGNOSIS — Z636 Dependent relative needing care at home: Secondary | ICD-10-CM

## 2015-05-05 DIAGNOSIS — J069 Acute upper respiratory infection, unspecified: Secondary | ICD-10-CM | POA: Diagnosis not present

## 2015-05-05 DIAGNOSIS — R030 Elevated blood-pressure reading, without diagnosis of hypertension: Secondary | ICD-10-CM | POA: Diagnosis not present

## 2015-05-05 DIAGNOSIS — R0609 Other forms of dyspnea: Secondary | ICD-10-CM

## 2015-05-05 DIAGNOSIS — B9789 Other viral agents as the cause of diseases classified elsewhere: Secondary | ICD-10-CM

## 2015-05-05 MED ORDER — AMLODIPINE BESYLATE 5 MG PO TABS: 5.0000 mg | ORAL_TABLET | Freq: Every day | ORAL | Status: DC

## 2015-05-05 NOTE — Assessment & Plan Note (Signed)
Supportive care discussed - anticipate should continue to improve daily. Red flags to seek urgent care dicussed.

## 2015-05-05 NOTE — Progress Notes (Signed)
BP 150/84 mmHg  Pulse 84  Temp(Src) 98.2 F (36.8 C) (Oral)  Wt 196 lb 8 oz (89.132 kg)  SpO2 97%   CC: BP check  Subjective:    Patient ID: Jonathon Chavez, male    DOB: 1945/03/06, 70 y.o.   MRN: [id number]  HPI: UBER BARWICK is a 70 y.o. male presenting on 05/05/2015 for Blood Pressure Check   Pt of Dr Alla German presents today for BP check. Yesterday felt dyspneic, mild headache, blurry vision, and general weakness. Checked bp and it was high at 184/80s. Checked throughout rest of day and ranged 0000000 systolic. More weak over last week. Endorses burst blood vessel R eye last week. Took ~500mg  aspirin yesterday. Endorses dyspnea on exertion worsening over last few weeks (started prior to URI sxs). Denies chest pain/tighntess, nausea, diaphoresis, palpitations or dizziness. No claudication sxs. No fevers. Increased sputum production in am.  No personal hx CAD. Father s/p 5v CABG around age 72yo.  Increased stress in his life over the last week - deteriorating father with alzheimers and pt is caregiver.  Currently with URI as well over last 8 days, slowly improving.   BP Readings from Last 3 Encounters:  05/05/15 150/84  02/15/15 120/60  02/08/14 130/80    Relevant past medical, surgical, family and social history reviewed and updated as indicated. Interim medical history since our last visit reviewed. Allergies and medications reviewed and updated. No current outpatient prescriptions on file prior to visit.   No current facility-administered medications on file prior to visit.    Review of Systems Per HPI unless specifically indicated in ROS section     Objective:    BP 150/84 mmHg  Pulse 84  Temp(Src) 98.2 F (36.8 C) (Oral)  Wt 196 lb 8 oz (89.132 kg)  SpO2 97%  Wt Readings from Last 3 Encounters:  05/05/15 196 lb 8 oz (89.132 kg)  02/15/15 193 lb (87.544 kg)  02/08/14 192 lb (87.091 kg)    Physical Exam  Constitutional: He appears well-developed and  well-nourished. No distress.  HENT:  Head: Normocephalic and atraumatic.  Right Ear: Hearing, tympanic membrane, external ear and ear canal normal.  Left Ear: Hearing, tympanic membrane, external ear and ear canal normal.  Nose: Nose normal. No mucosal edema or rhinorrhea. Right sinus exhibits no maxillary sinus tenderness and no frontal sinus tenderness. Left sinus exhibits no maxillary sinus tenderness and no frontal sinus tenderness.  Mouth/Throat: Uvula is midline, oropharynx is clear and moist and mucous membranes are normal. No oropharyngeal exudate, posterior oropharyngeal edema, posterior oropharyngeal erythema or tonsillar abscesses.  Eyes: Conjunctivae and EOM are normal. Pupils are equal, round, and reactive to light. No scleral icterus.  Neck: Normal range of motion. Neck supple. No thyromegaly present.  Cardiovascular: Normal rate, regular rhythm, normal heart sounds and intact distal pulses.   No murmur heard. Pulmonary/Chest: Effort normal and breath sounds normal. No respiratory distress. He has no wheezes. He has no rales.  Deep cough present but lungs clear  Lymphadenopathy:    He has no cervical adenopathy.  Skin: Skin is warm and dry. No rash noted.  Nursing note and vitals reviewed.  Results for orders placed or performed in visit on 02/15/15  Comprehensive metabolic panel  Result Value Ref Range   Sodium 140 135 - 145 mEq/L   Potassium 4.3 3.5 - 5.1 mEq/L   Chloride 102 96 - 112 mEq/L   CO2 32 19 - 32 mEq/L   Glucose, Bld 103 (  H) 70 - 99 mg/dL   BUN 16 6 - 23 mg/dL   Creatinine, Ser 0.95 0.40 - 1.50 mg/dL   Total Bilirubin 0.7 0.2 - 1.2 mg/dL   Alkaline Phosphatase 44 39 - 117 U/L   AST 25 0 - 37 U/L   ALT 31 0 - 53 U/L   Total Protein 7.2 6.0 - 8.3 g/dL   Albumin 4.2 3.5 - 5.2 g/dL   Calcium 9.6 8.4 - 10.5 mg/dL   GFR 83.83 >60.00 mL/min  CBC with Differential/Platelet  Result Value Ref Range   WBC 5.2 4.0 - 10.5 K/uL   RBC 5.29 4.22 - 5.81 Mil/uL    Hemoglobin 15.2 13.0 - 17.0 g/dL   HCT 45.4 39.0 - 52.0 %   MCV 85.8 78.0 - 100.0 fl   MCHC 33.5 30.0 - 36.0 g/dL   RDW 13.7 11.5 - 15.5 %   Platelets 161.0 150.0 - 400.0 K/uL   Neutrophils Relative % 59.1 43.0 - 77.0 %   Lymphocytes Relative 29.9 12.0 - 46.0 %   Monocytes Relative 7.4 3.0 - 12.0 %   Eosinophils Relative 2.9 0.0 - 5.0 %   Basophils Relative 0.7 0.0 - 3.0 %   Neutro Abs 3.1 1.4 - 7.7 K/uL   Lymphs Abs 1.6 0.7 - 4.0 K/uL   Monocytes Absolute 0.4 0.1 - 1.0 K/uL   Eosinophils Absolute 0.2 0.0 - 0.7 K/uL   Basophils Absolute 0.0 0.0 - 0.1 K/uL  PSA  Result Value Ref Range   PSA 2.31 0.10 - 4.00 ng/mL   Lab Results  Component Value Date   CHOL 178 02/08/2014   HDL 28.30* 02/08/2014   LDLCALC 127* 02/08/2014   TRIG 114.0 02/08/2014   CHOLHDL 6 02/08/2014       Assessment & Plan:   Problem List Items Addressed This Visit    Viral URI with cough    Supportive care discussed - anticipate should continue to improve daily. Red flags to seek urgent care dicussed.      DOE (dyspnea on exertion) - Primary    With fatigue. Sxs progressing over last few weeks without chest pain. Possibly just from combination of URI + caregiver stress but with fmhx CAD and age discussed reasonable to refer to cardiology to discuss possible stress test. rec start aspirin 81mg  daily (was not regular with this) Red flags to seek urgent care discussed. EKG - NSR rate 75, normal axis, intervals, no hypertrophy or acute ST/T changes, some nonspecific septal T wave flattening.      Relevant Orders   EKG 12-Lead (Completed)   Lipid panel   Basic metabolic panel   TSH   Ambulatory referral to Cardiology   Blood pressure elevated without history of HTN    Given symptoms of mild headache and recent subconjunctival hemorrhage, reasonable to start antihypertensive. Provided with printed Rx for amlodipine and discussed indications on when to fill (if bp persistently >140/90).       Other  Visit Diagnoses    Caregiver stress            Follow up plan: Return if symptoms worsen or fail to improve.

## 2015-05-05 NOTE — Telephone Encounter (Signed)
Ok by me

## 2015-05-05 NOTE — Telephone Encounter (Signed)
Patient saw Dr.Gutierrez today.  Patient would like to switch from [facility] to [facility].  Patient would like to switch because he liked Dr.Gutierrez.  Can patient switch?

## 2015-05-05 NOTE — Assessment & Plan Note (Addendum)
With fatigue. Sxs progressing over last few weeks without chest pain. Possibly just from combination of URI + caregiver stress but with fmhx CAD and age discussed reasonable to refer to cardiology to discuss possible stress test. rec start aspirin 81mg  daily (was not regular with this) Return tomorrow fasting for further labwork. Red flags to seek urgent care discussed. EKG - NSR rate 75, normal axis, intervals, no hypertrophy or acute ST/T changes, some nonspecific septal T wave flattening.

## 2015-05-05 NOTE — Patient Instructions (Addendum)
Be more regular with daily aspirin 81mg  daily. Start checking blood pressure daily - if consistently >140/90, start amlodipine 5mg  daily (printed out today).  We will refer you to cardiologist for further evaluation - pass by Marion's office for referral.  Return tomorrow for fasting labs.  If chest pain/tightness, worsening trouble breathing, seek urgent care over weekend.  Out of work for next 3 days.

## 2015-05-05 NOTE — Assessment & Plan Note (Signed)
Given symptoms of mild headache and recent subconjunctival hemorrhage, reasonable to start antihypertensive. Provided with printed Rx for amlodipine and discussed indications on when to fill (if bp persistently >140/90).

## 2015-05-05 NOTE — Progress Notes (Signed)
Pre visit review using our clinic review tool, if applicable. No additional management support is needed unless otherwise documented below in the visit note. 

## 2015-05-06 ENCOUNTER — Encounter: Payer: Self-pay | Admitting: Cardiovascular Disease

## 2015-05-06 ENCOUNTER — Other Ambulatory Visit (INDEPENDENT_AMBULATORY_CARE_PROVIDER_SITE_OTHER): Payer: Managed Care, Other (non HMO)

## 2015-05-06 ENCOUNTER — Ambulatory Visit (INDEPENDENT_AMBULATORY_CARE_PROVIDER_SITE_OTHER): Payer: Managed Care, Other (non HMO) | Admitting: Cardiovascular Disease

## 2015-05-06 VITALS — BP 148/82 | HR 71 | Ht 73.0 in | Wt 198.5 lb

## 2015-05-06 DIAGNOSIS — B9789 Other viral agents as the cause of diseases classified elsewhere: Secondary | ICD-10-CM

## 2015-05-06 DIAGNOSIS — R030 Elevated blood-pressure reading, without diagnosis of hypertension: Secondary | ICD-10-CM

## 2015-05-06 DIAGNOSIS — R0609 Other forms of dyspnea: Secondary | ICD-10-CM | POA: Diagnosis not present

## 2015-05-06 DIAGNOSIS — R5382 Chronic fatigue, unspecified: Secondary | ICD-10-CM

## 2015-05-06 DIAGNOSIS — R5383 Other fatigue: Secondary | ICD-10-CM | POA: Insufficient documentation

## 2015-05-06 DIAGNOSIS — J069 Acute upper respiratory infection, unspecified: Secondary | ICD-10-CM

## 2015-05-06 DIAGNOSIS — Z730 Burn-out: Secondary | ICD-10-CM | POA: Insufficient documentation

## 2015-05-06 DIAGNOSIS — Z7389 Other problems related to life management difficulty: Secondary | ICD-10-CM

## 2015-05-06 DIAGNOSIS — Z739 Problem related to life management difficulty, unspecified: Secondary | ICD-10-CM | POA: Diagnosis not present

## 2015-05-06 LAB — BASIC METABOLIC PANEL
BUN: 18 mg/dL (ref 6–23)
CO2: 30 mEq/L (ref 19–32)
Calcium: 9.6 mg/dL (ref 8.4–10.5)
Chloride: 102 mEq/L (ref 96–112)
Creatinine, Ser: 1.02 mg/dL (ref 0.40–1.50)
GFR: 77.18 mL/min (ref >60.00)
GLUCOSE: 118 mg/dL — AB (ref 70–99)
POTASSIUM: 4.3 meq/L (ref 3.5–5.1)
SODIUM: 139 meq/L (ref 135–145)

## 2015-05-06 LAB — LIPID PANEL
CHOLESTEROL: 172 mg/dL (ref 0–200)
HDL: 32.8 mg/dL — ABNORMAL LOW (ref >39.00)
LDL CALC: 106 mg/dL — AB (ref 0–99)
NonHDL: 139.07
Total CHOL/HDL Ratio: 5
Triglycerides: 163 mg/dL — ABNORMAL HIGH (ref 0.0–149.0)
VLDL: 32.6 mg/dL (ref 0.0–40.0)

## 2015-05-06 LAB — TSH: TSH: 2.02 u[IU]/mL (ref 0.35–4.50)

## 2015-05-06 NOTE — Progress Notes (Signed)
Patient ID: Jonathon Chavez, male    DOB: 1945-04-02, 70 y.o.   MRN: [id number]  HPI Comments: Jonathon Chavez is a pleasant 70 year old gentleman with remote history of smoking for 10 years, who presents by referral from Dr. Danise Mina for consultation for labile blood pressure, fatigue.  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 During this period of time, he is sleep deprived, exhausted. Tries to recover and then go back to work. Sometimes feels it is dangerous when he is driving as he is so tired They do have some help, recently started hiring a lady to help with the housework at the father's house.  He reports having frequent urination at nighttime Recently noted to have labile blood pressures in the setting of recent illness/URI and extreme fatigue Sometimes systolic pressures up to [id number], other times down to 120, he is concerned that it is labile Also reports having fatigue when he climbs stairs He has not been working out, use to walk frequently, has not been doing any exercise as most of his time is spent taking care of his father  EKG on today's visit shows normal sinus rhythm with rate 75 beats minute, no significant ST or T-wave changes   No Known Allergies  Current Outpatient Prescriptions on File Prior to Visit  Medication Sig Dispense Refill  . aspirin 81 MG tablet Take 81 mg by mouth daily.    . Multiple Vitamin (MULTIVITAMIN) tablet Take 1 tablet by mouth daily.    . Omega-3 Fatty Acids (FISH OIL PO) Take by mouth daily.    Marland Kitchen OVER THE COUNTER MEDICATION Beta Prostate    .  amLODipine (NORVASC) 5 MG tablet Take 1 tablet (5 mg total) by mouth daily. (Patient not taking: Reported on 05/06/2015) 30 tablet 3   No current facility-administered medications on file prior to visit.    Past Medical History  Diagnosis Date  . GERD (gastroesophageal reflux disease)   . Hemorrhoids   . BPH (benign prostatic hypertrophy)   . Hypertension     Past Surgical History  Procedure Laterality Date  . Cholecystectomy  8/14    Dr Pat Patrick    Social History  reports that he quit smoking about 42 years ago. He has never used smokeless tobacco. He reports that he does not drink alcohol or use illicit drugs.  Family History family history includes AAA (abdominal aortic aneurysm) in his father; CAD (age of onset: 67) in his father; CVA (age of onset: 75) in his father; Cancer in his brother, brother, and mother; Dementia in his father; Diabetes in his maternal grandmother; Heart disease in his father.    Review of Systems  Constitutional: Positive for fatigue.  HENT: Negative.   Eyes: Negative.   Respiratory: Positive for shortness of breath.   Cardiovascular: Negative.   Gastrointestinal: Negative.   Endocrine: Negative.   Musculoskeletal: Negative.   Skin: Negative.   Allergic/Immunologic: Negative.   Neurological: Negative.   Hematological: Negative.   Psychiatric/Behavioral: Positive for sleep disturbance, dysphoric mood and decreased concentration.  All other systems reviewed and  are negative.   BP 148/82 mmHg  Pulse 71  Ht 6\' 1"  (1.854 m)  Wt 198 lb 8 oz (90.039 kg)  BMI 26.19 kg/m2  Physical Exam  Constitutional: He is oriented to person, place, and time. He appears well-developed and well-nourished.  HENT:  Head: Normocephalic.  Nose: Nose normal.  Mouth/Throat: Oropharynx is clear and moist.  Eyes: Conjunctivae are normal. Pupils are equal, round, and reactive to light.  Neck: Normal range of motion. Neck supple. No JVD present.  Cardiovascular: Normal  rate, regular rhythm, normal heart sounds and intact distal pulses.  Exam reveals no gallop and no friction rub.   No murmur heard. Pulmonary/Chest: Effort normal and breath sounds normal. No respiratory distress. He has no wheezes. He has no rales. He exhibits no tenderness.  Abdominal: Soft. Bowel sounds are normal. He exhibits no distension. There is no tenderness.  Musculoskeletal: Normal range of motion. He exhibits no edema or tenderness.  Lymphadenopathy:    He has no cervical adenopathy.  Neurological: He is alert and oriented to person, place, and time. Coordination normal.  Skin: Skin is warm and dry. No rash noted. No erythema.  Psychiatric: He has a normal mood and affect. His behavior is normal. Judgment and thought content normal.

## 2015-05-06 NOTE — Assessment & Plan Note (Signed)
Most of the visit today spent discussing his exhaustion, sleep deprivation in an effort to manage his father's affairs. Stays with his father for several nights per week and trying to maintain a busy lifestyle, personal life, work schedule. 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 Not exercising and taking care of himself.  Classic signs of caretaker burnout. Recommended to the patient and his wife that they consider hiring more help so that he can avoid sleepless nights and have a respite

## 2015-05-06 NOTE — Assessment & Plan Note (Signed)
Clinically he appears to be doing well though appears exhausted on today's exam Suspect that some of his shortness of breath and fatigue is secondary to sleeplessness/insomnia. Dispense many of his days taking care of his elderly father, has sleep deprivation as takes care of him overnight for several nights in a row.

## 2015-05-06 NOTE — Patient Instructions (Signed)
You are doing well. No medication changes were made.  Please monitor your blood pressure at home   Research CT coronary calcium score , to look for blockages if needed $150  Please call us if you have new issues that need to be addressed before your next appt.

## 2015-05-06 NOTE — Assessment & Plan Note (Signed)
Labile blood pressure in the past several days. I suspect that some of his labile numbers are secondary to stressors at home. Most of the visit spent talking about his elderly father Recommended that he use his blood pressure cuff at home, monitor his blood pressures periodically at different times of the day and call our office if this continues to run high. Would hold off on starting the amlodipine until he has more blood pressure numbers

## 2015-05-06 NOTE — Assessment & Plan Note (Signed)
Recent viral URI, day #9 per the patient Suspect this will resolve with supportive care At this point does not seem to be turning into bronchitis

## 2015-05-06 NOTE — Assessment & Plan Note (Signed)
Fatigue likely secondary to extreme exhaustion, with little time to take care of himself As detailed above, recommended more time off for himself, hiring help to take care of his father   Total encounter time more than 45 minutes  Greater than 50% was spent in counseling and coordination of care with the patient

## 2015-05-07 NOTE — Telephone Encounter (Signed)
yes

## 2015-05-18 ENCOUNTER — Ambulatory Visit: Payer: Managed Care, Other (non HMO) | Admitting: Cardiology

## 2016-02-16 ENCOUNTER — Ambulatory Visit (INDEPENDENT_AMBULATORY_CARE_PROVIDER_SITE_OTHER): Payer: Managed Care, Other (non HMO) | Admitting: Family Medicine

## 2016-02-16 ENCOUNTER — Encounter: Payer: Self-pay | Admitting: Family Medicine

## 2016-02-16 VITALS — BP 126/66 | HR 68 | Temp 98.0°F | Ht 74.0 in | Wt 196.8 lb

## 2016-02-16 DIAGNOSIS — Z730 Burn-out: Secondary | ICD-10-CM

## 2016-02-16 DIAGNOSIS — R222 Localized swelling, mass and lump, trunk: Secondary | ICD-10-CM

## 2016-02-16 DIAGNOSIS — Z7189 Other specified counseling: Secondary | ICD-10-CM | POA: Insufficient documentation

## 2016-02-16 DIAGNOSIS — B9789 Other viral agents as the cause of diseases classified elsewhere: Secondary | ICD-10-CM

## 2016-02-16 DIAGNOSIS — E785 Hyperlipidemia, unspecified: Secondary | ICD-10-CM | POA: Diagnosis not present

## 2016-02-16 DIAGNOSIS — Z125 Encounter for screening for malignant neoplasm of prostate: Secondary | ICD-10-CM

## 2016-02-16 DIAGNOSIS — J069 Acute upper respiratory infection, unspecified: Secondary | ICD-10-CM | POA: Diagnosis not present

## 2016-02-16 DIAGNOSIS — Z0000 Encounter for general adult medical examination without abnormal findings: Secondary | ICD-10-CM | POA: Diagnosis not present

## 2016-02-16 DIAGNOSIS — L989 Disorder of the skin and subcutaneous tissue, unspecified: Secondary | ICD-10-CM | POA: Insufficient documentation

## 2016-02-16 DIAGNOSIS — E1169 Type 2 diabetes mellitus with other specified complication: Secondary | ICD-10-CM | POA: Insufficient documentation

## 2016-02-16 NOTE — Assessment & Plan Note (Signed)
Update FLP today.

## 2016-02-16 NOTE — Progress Notes (Signed)
Pre visit review using our clinic review tool, if applicable. No additional management support is needed unless otherwise documented below in the visit note. 

## 2016-02-16 NOTE — Assessment & Plan Note (Signed)
Preventative protocols reviewed and updated unless pt declined. Discussed healthy diet and lifestyle.  

## 2016-02-16 NOTE — Assessment & Plan Note (Addendum)
Started today. Supportive care reviewed with patient.

## 2016-02-16 NOTE — Assessment & Plan Note (Signed)
Father passed away earlier this year.

## 2016-02-16 NOTE — Patient Instructions (Addendum)
Advanced directive packet provided today Labs today You are doing well today Return as needed or in 1 year for next physical.  Health Maintenance, Male A healthy lifestyle and preventative care can promote health and wellness.  Maintain regular health, dental, and eye exams.  Eat a healthy diet. Foods like vegetables, fruits, whole grains, low-fat dairy products, and lean protein foods contain the nutrients you need and are low in calories. Decrease your intake of foods high in solid fats, added sugars, and salt. Get information about a proper diet from your health care provider, if necessary.  Regular physical exercise is one of the most important things you can do for your health. Most adults should get at least 150 minutes of moderate-intensity exercise (any activity that increases your heart rate and causes you to sweat) each week. In addition, most adults need muscle-strengthening exercises on 2 or more days a week.   Maintain a healthy weight. The body mass index (BMI) is a screening tool to identify possible weight problems. It provides an estimate of body fat based on height and weight. Your health care provider can find your BMI and can help you achieve or maintain a healthy weight. For males 20 years and older:  A BMI below 18.5 is considered underweight.  A BMI of 18.5 to 24.9 is normal.  A BMI of 25 to 29.9 is considered overweight.  A BMI of 30 and above is considered obese.  Maintain normal blood lipids and cholesterol by exercising and minimizing your intake of saturated fat. Eat a balanced diet with plenty of fruits and vegetables. Blood tests for lipids and cholesterol should begin at age 24 and be repeated every 5 years. If your lipid or cholesterol levels are high, you are over age 98, or you are at high risk for heart disease, you may need your cholesterol levels checked more frequently.Ongoing high lipid and cholesterol levels should be treated with medicines if diet  and exercise are not working.  If you smoke, find out from your health care provider how to quit. If you do not use tobacco, do not start.  Lung cancer screening is recommended for adults aged 62-80 years who are at high risk for developing lung cancer because of a history of smoking. A yearly low-dose CT scan of the lungs is recommended for people who have at least a 30-pack-year history of smoking and are current smokers or have quit within the past 15 years. A pack year of smoking is smoking an average of 1 pack of cigarettes a day for 1 year (for example, a 30-pack-year history of smoking could mean smoking 1 pack a day for 30 years or 2 packs a day for 15 years). Yearly screening should continue until the smoker has stopped smoking for at least 15 years. Yearly screening should be stopped for people who develop a health problem that would prevent them from having lung cancer treatment.  If you choose to drink alcohol, do not have more than 2 drinks per day. One drink is considered to be 12 oz (360 mL) of beer, 5 oz (150 mL) of wine, or 1.5 oz (45 mL) of liquor.  Avoid the use of street drugs. Do not share needles with anyone. Ask for help if you need support or instructions about stopping the use of drugs.  High blood pressure causes heart disease and increases the risk of stroke. High blood pressure is more likely to develop in:  People who have blood pressure in  the end of the normal range (100-139/85-89 mm Hg).  People who are overweight or obese.  People who are African American.  If you are 59-34 years of age, have your blood pressure checked every 3-5 years. If you are 68 years of age or older, have your blood pressure checked every year. You should have your blood pressure measured twice-once when you are at a hospital or clinic, and once when you are not at a hospital or clinic. Record the average of the two measurements. To check your blood pressure when you are not at a hospital or  clinic, you can use:  An automated blood pressure machine at a pharmacy.  A home blood pressure monitor.  If you are 26-68 years old, ask your health care provider if you should take aspirin to prevent heart disease.  Diabetes screening involves taking a blood sample to check your fasting blood sugar level. This should be done once every 3 years after age 51 if you are at a normal weight and without risk factors for diabetes. Testing should be considered at a younger age or be carried out more frequently if you are overweight and have at least 1 risk factor for diabetes.  Colorectal cancer can be detected and often prevented. Most routine colorectal cancer screening begins at the age of 58 and continues through age 14. However, your health care provider may recommend screening at an earlier age if you have risk factors for colon cancer. On a yearly basis, your health care provider may provide home test kits to check for hidden blood in the stool. A small camera at the end of a tube may be used to directly examine the colon (sigmoidoscopy or colonoscopy) to detect the earliest forms of colorectal cancer. Talk to your health care provider about this at age 44 when routine screening begins. A direct exam of the colon should be repeated every 5-10 years through age 31, unless early forms of precancerous polyps or small growths are found.  People who are at an increased risk for hepatitis B should be screened for this virus. You are considered at high risk for hepatitis B if:  You were born in a country where hepatitis B occurs often. Talk with your health care provider about which countries are considered high risk.  Your parents were born in a high-risk country and you have not received a shot to protect against hepatitis B (hepatitis B vaccine).  You have HIV or AIDS.  You use needles to inject street drugs.  You live with, or have sex with, someone who has hepatitis B.  You are a man who has  sex with other men (MSM).  You get hemodialysis treatment.  You take certain medicines for conditions like cancer, organ transplantation, and autoimmune conditions.  Hepatitis C blood testing is recommended for all people born from [birth year] through 1963 and any individual with known risk factors for hepatitis C.  Healthy men should no longer receive prostate-specific antigen (PSA) blood tests as part of routine cancer screening. Talk to your health care provider about prostate cancer screening.  Testicular cancer screening is not recommended for adolescents or adult males who have no symptoms. Screening includes self-exam, a health care provider exam, and other screening tests. Consult with your health care provider about any symptoms you have or any concerns you have about testicular cancer.  Practice safe sex. Use condoms and avoid high-risk sexual practices to reduce the spread of sexually transmitted infections (STIs).  You  should be screened for STIs, including gonorrhea and chlamydia if:  You are sexually active and are younger than 24 years.  You are older than 24 years, and your health care provider tells you that you are at risk for this type of infection.  Your sexual activity has changed since you were last screened, and you are at an increased risk for chlamydia or gonorrhea. Ask your health care provider if you are at risk.  If you are at risk of being infected with HIV, it is recommended that you take a prescription medicine daily to prevent HIV infection. This is called pre-exposure prophylaxis (PrEP). You are considered at risk if:  You are a man who has sex with other men (MSM).  You are a heterosexual man who is sexually active with multiple partners.  You take drugs by injection.  You are sexually active with a partner who has HIV.  Talk with your health care provider about whether you are at high risk of being infected with HIV. If you choose to begin PrEP, you should  first be tested for HIV. You should then be tested every 3 months for as long as you are taking PrEP.  Use sunscreen. Apply sunscreen liberally and repeatedly throughout the day. You should seek shade when your shadow is shorter than you. Protect yourself by wearing long sleeves, pants, a wide-brimmed hat, and sunglasses year round whenever you are outdoors.  Tell your health care provider of new moles or changes in moles, especially if there is a change in shape or color. Also, tell your health care provider if a mole is larger than the size of a pencil eraser.  A one-time screening for abdominal aortic aneurysm (AAA) and surgical repair of large AAAs by ultrasound is recommended for men aged 101-75 years who are current or former smokers.  Stay current with your vaccines (immunizations). This information is not intended to replace advice given to you by your health care provider. Make sure you discuss any questions you have with your health care provider. Document Released: 08/04/2007 Document Revised: 02/26/2014 Document Reviewed: 11/09/2014 Elsevier Interactive Patient Education  2017 [employer].

## 2016-02-16 NOTE — Assessment & Plan Note (Signed)
Advanced directive discussion - No living will. Wife would be health care POA. Would accept resuscitation--no prolonged ventilation. Probably no tube feeds if cognitively unaware. Packet provided today.

## 2016-02-16 NOTE — Assessment & Plan Note (Signed)
Present for 20+ yrs - anticipate benign cyst. Monitor for now.

## 2016-02-16 NOTE — Progress Notes (Signed)
BP 126/66   Pulse 68   Temp 98 F (36.7 C) (Oral)   Ht 6\' 2"  (1.88 m)   Wt 196 lb 12 oz (89.2 kg)   BMI 25.26 kg/m    CC: CPE Subjective:    Patient ID: Jonathon Chavez, male    DOB: [birth year], 70 y.o.   MRN: [id number]  HPI: Jonathon Chavez is a 70 y.o. male presenting on 02/16/2016 for Annual Exam   Father passed away earlier this year - alz disease. Pt was his caregiver.   Preventative: Fasting today Colon cancer screening - sessile serrated polyp, diverticulosis, rpt 5 yrs (Pyrtle) Prostate cancer screening - yearly DRE/PSA Lung cancer screening - not eligible Flu shot yearly Tdap 2014 Pneumovax 2014, prevnar 2015 Shingles shot - 2012 Advanced directive discussion - No living will. Wife would be health care POA. Would accept resuscitation--no prolonged ventilation. Probably no tube feeds if cognitively unaware. Packet provided today. Seat belt use discussed Sunscreen use and discussed. No changing moles on skin. Ex smoker (1975) Alcohol - none  Lives with wife.  Occupation: truck Geophysicist/field seismologist for [employer] Activity: walks dog, physical work Diet: good water, fruits/vegetables daily  Relevant past medical, surgical, family and social history reviewed and updated as indicated. Interim medical history since our last visit reviewed. Allergies and medications reviewed and updated. No current outpatient prescriptions on file prior to visit.   No current facility-administered medications on file prior to visit.     Review of Systems  Constitutional: Negative for activity change, appetite change, chills, fatigue, fever and unexpected weight change.  HENT: Positive for congestion and rhinorrhea. Negative for hearing loss.   Eyes: Negative for visual disturbance.  Respiratory: Negative for cough, chest tightness, shortness of breath and wheezing.   Cardiovascular: Negative for chest pain, palpitations and leg swelling.  Gastrointestinal: Negative for abdominal  distention, abdominal pain, blood in stool, constipation, diarrhea, nausea and vomiting.  Genitourinary: Negative for difficulty urinating and hematuria.  Musculoskeletal: Negative for arthralgias, myalgias and neck pain.  Skin: Negative for rash.  Neurological: Positive for headaches. Negative for dizziness, seizures and syncope.  Hematological: Negative for adenopathy. Does not bruise/bleed easily.  Psychiatric/Behavioral: Negative for dysphoric mood. The patient is not nervous/anxious.    Per HPI unless specifically indicated in ROS section     Objective:    BP 126/66   Pulse 68   Temp 98 F (36.7 C) (Oral)   Ht 6\' 2"  (1.88 m)   Wt 196 lb 12 oz (89.2 kg)   BMI 25.26 kg/m   Wt Readings from Last 3 Encounters:  02/16/16 196 lb 12 oz (89.2 kg)  05/06/15 198 lb 8 oz (90 kg)  05/05/15 196 lb 8 oz (89.1 kg)    Physical Exam  Constitutional: He is oriented to person, place, and time. He appears well-developed and well-nourished. No distress.  HENT:  Head: Normocephalic and atraumatic.  Right Ear: Hearing, tympanic membrane, external ear and ear canal normal.  Left Ear: Hearing, tympanic membrane, external ear and ear canal normal.  Nose: Nose normal.  Mouth/Throat: Uvula is midline, oropharynx is clear and moist and mucous membranes are normal. No oropharyngeal exudate, posterior oropharyngeal edema or posterior oropharyngeal erythema.  Eyes: Conjunctivae and EOM are normal. Pupils are equal, round, and reactive to light. No scleral icterus.  Neck: Normal range of motion. Neck supple. Carotid bruit is not present. No thyromegaly present.  Cardiovascular: Normal rate, regular rhythm, normal heart sounds and intact distal pulses.  No murmur heard. Pulses:      Radial pulses are 2+ on the right side, and 2+ on the left side.  Pulmonary/Chest: Effort normal and breath sounds normal. No respiratory distress. He has no wheezes. He has no rales.  Abdominal: Soft. Bowel sounds are  normal. He exhibits no distension and no mass. There is no tenderness. There is no rebound and no guarding.  Genitourinary: Rectum normal and prostate normal. Rectal exam shows no external hemorrhoid, no internal hemorrhoid, no fissure, no mass, no tenderness and anal tone normal. Prostate is not enlarged (20gm) and not tender.  Genitourinary Comments: Soft nontender mass L sacral region - present for 20 yrs per patient  Musculoskeletal: Normal range of motion. He exhibits no edema.  Lymphadenopathy:    He has no cervical adenopathy.  Neurological: He is alert and oriented to person, place, and time.  CN grossly intact, station and gait intact  Skin: Skin is warm and dry. No rash noted.  Psychiatric: He has a normal mood and affect. His behavior is normal. Judgment and thought content normal.  Nursing note and vitals reviewed.  Results for orders placed or performed in visit on 05/06/15  Lipid panel  Result Value Ref Range   Cholesterol 172 0 - 200 mg/dL   Triglycerides 163.0 (H) 0.0 - 149.0 mg/dL   HDL 32.80 (L) >39.00 mg/dL   VLDL 32.6 0.0 - 40.0 mg/dL   LDL Cholesterol 106 (H) 0 - 99 mg/dL   Total CHOL/HDL Ratio 5    NonHDL [id number]   Basic metabolic panel  Result Value Ref Range   Sodium 139 135 - 145 mEq/L   Potassium 4.3 3.5 - 5.1 mEq/L   Chloride 102 96 - 112 mEq/L   CO2 30 19 - 32 mEq/L   Glucose, Bld 118 (H) 70 - 99 mg/dL   BUN 18 6 - 23 mg/dL   Creatinine, Ser 1.02 0.40 - 1.50 mg/dL   Calcium 9.6 8.4 - 10.5 mg/dL   GFR 77.18 >60.00 mL/min  TSH  Result Value Ref Range   TSH 2.02 0.35 - 4.50 uIU/mL      Assessment & Plan:   Problem List Items Addressed This Visit    Advanced care planning/counseling discussion    Advanced directive discussion - No living will. Wife would be health care POA. Would accept resuscitation--no prolonged ventilation. Probably no tube feeds if cognitively unaware. Packet provided today.      Burnout of caregiver    Father passed away  earlier this year.       Dyslipidemia    Update FLP today.       Relevant Orders   Basic metabolic panel   Lipid panel   Nodule of buttock    Present for 20+ yrs - anticipate benign cyst. Monitor for now.      Routine general medical examination at a health care facility - Primary    Preventative protocols reviewed and updated unless pt declined. Discussed healthy diet and lifestyle.       Viral URI with cough    Started today. Supportive care reviewed with patient.        Other Visit Diagnoses    Special screening for malignant neoplasm of prostate       Relevant Orders   PSA       Follow up plan: Return in about 1 year (around 02/15/2017) for annual exam, prior fasting for blood work.  Ria Bush, MD

## 2016-02-17 ENCOUNTER — Encounter: Payer: Managed Care, Other (non HMO) | Admitting: Internal Medicine

## 2016-02-17 LAB — BASIC METABOLIC PANEL
BUN: 17 mg/dL (ref 6–23)
CALCIUM: 9.4 mg/dL (ref 8.4–10.5)
CHLORIDE: 102 meq/L (ref 96–112)
CO2: 33 meq/L — AB (ref 19–32)
CREATININE: 0.93 mg/dL (ref 0.40–1.50)
GFR: 85.66 mL/min (ref >60.00)
Glucose, Bld: 100 mg/dL — ABNORMAL HIGH (ref 70–99)
Potassium: 4.5 mEq/L (ref 3.5–5.1)
Sodium: 141 mEq/L (ref 135–145)

## 2016-02-17 LAB — LIPID PANEL
CHOL/HDL RATIO: 5
Cholesterol: 165 mg/dL (ref 0–200)
HDL: 33.8 mg/dL — ABNORMAL LOW (ref >39.00)
NONHDL: 130.81
TRIGLYCERIDES: 217 mg/dL — AB (ref 0.0–149.0)
VLDL: 43.4 mg/dL — ABNORMAL HIGH (ref 0.0–40.0)

## 2016-02-17 LAB — PSA: PSA: 2.97 ng/mL (ref 0.10–4.00)

## 2016-02-17 LAB — LDL CHOLESTEROL, DIRECT: Direct LDL: 112 mg/dL

## 2016-02-20 ENCOUNTER — Encounter: Payer: Self-pay | Admitting: Family Medicine

## 2016-02-20 HISTORY — PX: CATARACT EXTRACTION: SUR2

## 2016-02-21 DIAGNOSIS — H2513 Age-related nuclear cataract, bilateral: Secondary | ICD-10-CM | POA: Diagnosis not present

## 2016-02-21 DIAGNOSIS — H1851 Endothelial corneal dystrophy: Secondary | ICD-10-CM | POA: Diagnosis not present

## 2016-02-21 DIAGNOSIS — H33193 Other retinoschisis and retinal cysts, bilateral: Secondary | ICD-10-CM | POA: Diagnosis not present

## 2016-02-21 DIAGNOSIS — H43813 Vitreous degeneration, bilateral: Secondary | ICD-10-CM | POA: Diagnosis not present

## 2016-03-22 DIAGNOSIS — H2511 Age-related nuclear cataract, right eye: Secondary | ICD-10-CM | POA: Diagnosis not present

## 2016-04-28 DIAGNOSIS — H2512 Age-related nuclear cataract, left eye: Secondary | ICD-10-CM | POA: Diagnosis not present

## 2016-05-03 DIAGNOSIS — H52222 Regular astigmatism, left eye: Secondary | ICD-10-CM | POA: Diagnosis not present

## 2016-05-03 DIAGNOSIS — H2512 Age-related nuclear cataract, left eye: Secondary | ICD-10-CM | POA: Diagnosis not present

## 2016-08-27 ENCOUNTER — Encounter: Payer: Self-pay | Admitting: Family Medicine

## 2016-08-27 ENCOUNTER — Ambulatory Visit (INDEPENDENT_AMBULATORY_CARE_PROVIDER_SITE_OTHER): Payer: PPO | Admitting: Family Medicine

## 2016-08-27 VITALS — BP 124/74 | HR 66 | Temp 98.3°F | Ht 73.0 in | Wt 193.8 lb

## 2016-08-27 DIAGNOSIS — N401 Enlarged prostate with lower urinary tract symptoms: Secondary | ICD-10-CM | POA: Diagnosis not present

## 2016-08-27 DIAGNOSIS — E785 Hyperlipidemia, unspecified: Secondary | ICD-10-CM

## 2016-08-27 DIAGNOSIS — R350 Frequency of micturition: Secondary | ICD-10-CM | POA: Diagnosis not present

## 2016-08-27 DIAGNOSIS — Z7189 Other specified counseling: Secondary | ICD-10-CM | POA: Diagnosis not present

## 2016-08-27 DIAGNOSIS — Z0000 Encounter for general adult medical examination without abnormal findings: Secondary | ICD-10-CM | POA: Diagnosis not present

## 2016-08-27 DIAGNOSIS — L80 Vitiligo: Secondary | ICD-10-CM | POA: Diagnosis not present

## 2016-08-27 NOTE — Assessment & Plan Note (Addendum)
Advanced directive discussion - No living will. Wife would be health care POA. Would accept resuscitation--no prolonged ventilation. Probably no tube feeds if cognitively unaware. Packet provided last visit.

## 2016-08-27 NOTE — Assessment & Plan Note (Signed)
Mild sxs. Super beta prostate was not helpful. Declines further intervention or DRE today. Check PSA with labs today.

## 2016-08-27 NOTE — Progress Notes (Addendum)
BP 124/74   Pulse 66   Temp 98.3 F (36.8 C) (Oral)   Ht 6\' 1"  (1.854 m)   Wt 193 lb 12 oz (87.9 kg)   SpO2 96%   BMI 25.56 kg/m    CC: welcome to medicare  Subjective:    Patient ID: Jonathon Chavez, male    DOB: [birth year], 71 y.o.   MRN: [id number]  HPI: Jonathon Chavez is a 71 y.o. male presenting on 08/27/2016 for Annual Exam (Medicare)   Recent vitiligo started 03/2016.   No falls in the past year Declines depression/anhedonia, sadness  Hearing Screening   125Hz  250Hz  500Hz  1000Hz  2000Hz  3000Hz  4000Hz  6000Hz  8000Hz   Right ear:    40 40      Left ear:    40 40      Vision Screening Comments: Had cataract surgery April 2018   Preventative: Colon cancer screening 03/2013 - sessile serrated polyp, diverticulosis, rpt 5 yrs (Pyrtle) Prostate cancer screening - yearly DRE/PSA  Lung cancer screening - not eligible Flu shot yearly Tdap 2014 Pneumovax 2014, prevnar 2015 zostavax - 2012 shingrix - discussed Advanced directive discussion - No living will. Wife would be health care POA. Would accept resuscitation--no prolonged ventilation. Probably no tube feeds if cognitively unaware. Packet provided last year Seat belt use discussed.  Sunscreen use and discussed. No changing moles on skin. Saw Derm.  Ex smoker (1975) Alcohol - none  Lives with wife.  Occupation: truck Geophysicist/field seismologist for [employer] Activity: walks dog, physical work Diet: good water, fruits/vegetables daily  Relevant past medical, surgical, family and social history reviewed and updated as indicated. Interim medical history since our last visit reviewed. Allergies and medications reviewed and updated. No outpatient prescriptions prior to visit.   No facility-administered medications prior to visit.      Per HPI unless specifically indicated in ROS section below Review of Systems     Objective:    BP 124/74   Pulse 66   Temp 98.3 F (36.8 C) (Oral)   Ht 6\' 1"  (1.854 m)   Wt 193 lb 12 oz (87.9  kg)   SpO2 96%   BMI 25.56 kg/m   Wt Readings from Last 3 Encounters:  08/27/16 193 lb 12 oz (87.9 kg)  02/16/16 196 lb 12 oz (89.2 kg)  05/06/15 198 lb 8 oz (90 kg)    Physical Exam  Constitutional: He is oriented to person, place, and time. He appears well-developed and well-nourished. No distress.  HENT:  Head: Normocephalic and atraumatic.  Right Ear: Hearing, tympanic membrane, external ear and ear canal normal.  Left Ear: Hearing, tympanic membrane, external ear and ear canal normal.  Nose: Nose normal.  Mouth/Throat: Uvula is midline, oropharynx is clear and moist and mucous membranes are normal. No oropharyngeal exudate, posterior oropharyngeal edema or posterior oropharyngeal erythema.  Eyes: Conjunctivae and EOM are normal. Pupils are equal, round, and reactive to light. No scleral icterus.  Neck: Normal range of motion. Neck supple. Carotid bruit is not present. No thyromegaly present.  Cardiovascular: Normal rate, regular rhythm, normal heart sounds and intact distal pulses.   No murmur heard. Pulses:      Radial pulses are 2+ on the right side, and 2+ on the left side.  Pulmonary/Chest: Effort normal and breath sounds normal. No respiratory distress. He has no wheezes. He has no rales.  Abdominal: Soft. Bowel sounds are normal. He exhibits no distension and no mass. There is no tenderness. There is no  rebound and no guarding.  Genitourinary:  Genitourinary Comments: DRE deferred  Musculoskeletal: Normal range of motion. He exhibits no edema.  Lymphadenopathy:    He has no cervical adenopathy.  Neurological: He is alert and oriented to person, place, and time.  CN grossly intact, station and gait intact Recall 1/3, 3/3 with cue Calculation 5/5 serial 3s  Skin: Skin is warm and dry. No rash noted.  Psychiatric: He has a normal mood and affect. His behavior is normal. Judgment and thought content normal.  Nursing note and vitals reviewed.  Results for orders placed or  performed in visit on 02/16/16  PSA  Result Value Ref Range   PSA 2.97 0.10 - 4.00 ng/mL  Basic metabolic panel  Result Value Ref Range   Sodium 141 135 - 145 mEq/L   Potassium 4.5 3.5 - 5.1 mEq/L   Chloride 102 96 - 112 mEq/L   CO2 33 (H) 19 - 32 mEq/L   Glucose, Bld 100 (H) 70 - 99 mg/dL   BUN 17 6 - 23 mg/dL   Creatinine, Ser 0.93 0.40 - 1.50 mg/dL   Calcium 9.4 8.4 - 10.5 mg/dL   GFR 85.66 >60.00 mL/min  Lipid panel  Result Value Ref Range   Cholesterol 165 0 - 200 mg/dL   Triglycerides 217.0 (H) 0.0 - 149.0 mg/dL   HDL 33.80 (L) >39.00 mg/dL   VLDL 43.4 (H) 0.0 - 40.0 mg/dL   Total CHOL/HDL Ratio 5    NonHDL 130.81   LDL cholesterol, direct  Result Value Ref Range   Direct LDL 112.0 mg/dL    EKG - sinus bradycardia with prolonged PR, normal axis, intervals, no acute ST/T changes.     Assessment & Plan:   Problem List Items Addressed This Visit    Advanced care planning/counseling discussion    Advanced directive discussion - No living will. Wife would be health care POA. Would accept resuscitation--no prolonged ventilation. Probably no tube feeds if cognitively unaware. Packet provided last visit.       Benign prostatic hyperplasia    Mild sxs. Super beta prostate was not helpful. Declines further intervention or DRE today. Check PSA with labs today.       Relevant Orders   PSA   Dyslipidemia    Update FLP. Reviewed labs from last year, reviewed healthy diet changes to help control lipid levels.  The 10-year ASCVD risk score Mikey Bussing DC Brooke Bonito., et al., 2013) is: 17.4%   Values used to calculate the score:     Age: 40 years     Sex: Male     Is Non-Hispanic African American: No     Diabetic: No     Tobacco smoker: No     Systolic Blood Pressure: 573 mmHg     Is BP treated: No     HDL Cholesterol: 33.8 mg/dL     Total Cholesterol: 165 mg/dL       Relevant Orders   Lipid panel   Basic metabolic panel   Vitiligo    New onset this year BUE and BLE. Saw derm and  placed on steroid cream.       Welcome to Medicare preventive visit - Primary    I have personally reviewed the Medicare Annual Wellness questionnaire and have noted 1. The patient's medical and social history 2. Their use of alcohol, tobacco or illicit drugs 3. Their current medications and supplements 4. The patient's functional ability including ADL's, fall risks, home safety risks and hearing or  visual impairment. Cognitive function has been assessed and addressed as indicated.  5. Diet and physical activity 6. Evidence for depression or mood disorders The patients weight, height, BMI have been recorded in the chart. I have made referrals, counseling and provided education to the patient based on review of the above and I have provided the pt with a written personalized care plan for preventive services. Provider list updated.. See scanned questionairre as needed for further documentation. Reviewed preventative protocols and updated unless pt declined.           Follow up plan: Return in about 1 year (around 08/27/2017) for medicare wellness visit, annual exam, prior fasting for blood work.  Ria Bush, MD

## 2016-08-27 NOTE — Assessment & Plan Note (Signed)

## 2016-08-27 NOTE — Patient Instructions (Addendum)
If interested, check with pharmacy about new 2 shot shingles series (shingrix).  Update EKG today.  Labs today. Work on advanced directive Work on avoiding added sugar in diet, and try to increase fatty fish in diet to help triglyceride levels.  Return as needed or in 1 year for next medicare wellness visit with Katha Cabal and physical.   Health Maintenance, Male A healthy lifestyle and preventive care is important for your health and wellness. Ask your health care provider about what schedule of regular examinations is right for you. What should I know about weight and diet? Eat a Healthy Diet  Eat plenty of vegetables, fruits, whole grains, low-fat dairy products, and lean protein.  Do not eat a lot of foods high in solid fats, added sugars, or salt.  Maintain a Healthy Weight Regular exercise can help you achieve or maintain a healthy weight. You should:  Do at least 150 minutes of exercise each week. The exercise should increase your heart rate and make you sweat (moderate-intensity exercise).  Do strength-training exercises at least twice a week.  Watch Your Levels of Cholesterol and Blood Lipids  Have your blood tested for lipids and cholesterol every 5 years starting at 69 years of age. If you are at high risk for heart disease, you should start having your blood tested when you are 69 years old. You may need to have your cholesterol levels checked more often if: ? Your lipid or cholesterol levels are high. ? You are older than 69 years of age. ? You are at high risk for heart disease.  What should I know about cancer screening? Many types of cancers can be detected early and may often be prevented. Lung Cancer  You should be screened every year for lung cancer if: ? You are a current smoker who has smoked for at least 30 years. ? You are a former smoker who has quit within the past 15 years.  Talk to your health care provider about your screening options, when you should  start screening, and how often you should be screened.  Colorectal Cancer  Routine colorectal cancer screening usually begins at 69 years of age and should be repeated every 5-10 years until you are 69 years old. You may need to be screened more often if early forms of precancerous polyps or small growths are found. Your health care provider may recommend screening at an earlier age if you have risk factors for colon cancer.  Your health care provider may recommend using home test kits to check for hidden blood in the stool.  A small camera at the end of a tube can be used to examine your colon (sigmoidoscopy or colonoscopy). This checks for the earliest forms of colorectal cancer.  Prostate and Testicular Cancer  Depending on your age and overall health, your health care provider may do certain tests to screen for prostate and testicular cancer.  Talk to your health care provider about any symptoms or concerns you have about testicular or prostate cancer.  Skin Cancer  Check your skin from head to toe regularly.  Tell your health care provider about any new moles or changes in moles, especially if: ? There is a change in a mole's size, shape, or color. ? You have a mole that is larger than a pencil eraser.  Always use sunscreen. Apply sunscreen liberally and repeat throughout the day.  Protect yourself by wearing long sleeves, pants, a wide-brimmed hat, and sunglasses when outside.  What should  I know about heart disease, diabetes, and high blood pressure?  If you are 69-46 years of age, have your blood pressure checked every 3-5 years. If you are 15 years of age or older, have your blood pressure checked every year. You should have your blood pressure measured twice-once when you are at a hospital or clinic, and once when you are not at a hospital or clinic. Record the average of the two measurements. To check your blood pressure when you are not at a hospital or clinic, you can  use: ? An automated blood pressure machine at a pharmacy. ? A home blood pressure monitor.  Talk to your health care provider about your target blood pressure.  If you are between 83-34 years old, ask your health care provider if you should take aspirin to prevent heart disease.  Have regular diabetes screenings by checking your fasting blood sugar level. ? If you are at a normal weight and have a low risk for diabetes, have this test once every three years after the age of 16. ? If you are overweight and have a high risk for diabetes, consider being tested at a younger age or more often.  A one-time screening for abdominal aortic aneurysm (AAA) by ultrasound is recommended for men aged 29-75 years who are current or former smokers. What should I know about preventing infection? Hepatitis B If you have a higher risk for hepatitis B, you should be screened for this virus. Talk with your health care provider to find out if you are at risk for hepatitis B infection. Hepatitis C Blood testing is recommended for:  Everyone born from [birth year] through 1963.  Anyone with known risk factors for hepatitis C.  Sexually Transmitted Diseases (STDs)  You should be screened each year for STDs including gonorrhea and chlamydia if: ? You are sexually active and are younger than 69 years of age. ? You are older than 69 years of age and your health care provider tells you that you are at risk for this type of infection. ? Your sexual activity has changed since you were last screened and you are at an increased risk for chlamydia or gonorrhea. Ask your health care provider if you are at risk.  Talk with your health care provider about whether you are at high risk of being infected with HIV. Your health care provider may recommend a prescription medicine to help prevent HIV infection.  What else can I do?  Schedule regular health, dental, and eye exams.  Stay current with your vaccines  (immunizations).  Do not use any tobacco products, such as cigarettes, chewing tobacco, and e-cigarettes. If you need help quitting, ask your health care provider.  Limit alcohol intake to no more than 2 drinks per day. One drink equals 12 ounces of beer, 5 ounces of wine, or 1 ounces of hard liquor.  Do not use street drugs.  Do not share needles.  Ask your health care provider for help if you need support or information about quitting drugs.  Tell your health care provider if you often feel depressed.  Tell your health care provider if you have ever been abused or do not feel safe at home. This information is not intended to replace advice given to you by your health care provider. Make sure you discuss any questions you have with your health care provider. Document Released: 08/04/2007 Document Revised: 10/05/2015 Document Reviewed: 11/09/2014 Elsevier Interactive Patient Education  [employer].

## 2016-08-27 NOTE — Assessment & Plan Note (Signed)
New onset this year BUE and BLE. Saw derm and placed on steroid cream.

## 2016-08-27 NOTE — Addendum Note (Signed)
Addended by: Emelia Salisbury C on: 08/27/2016 03:53 PM   Modules accepted: Orders

## 2016-08-27 NOTE — Assessment & Plan Note (Signed)
Update FLP. Reviewed labs from last year, reviewed healthy diet changes to help control lipid levels.  The 10-year ASCVD risk score Mikey Bussing DC Brooke Bonito., et al., 2013) is: 17.4%   Values used to calculate the score:     Age: 71 years     Sex: Male     Is Non-Hispanic African American: No     Diabetic: No     Tobacco smoker: No     Systolic Blood Pressure: 987 mmHg     Is BP treated: No     HDL Cholesterol: 33.8 mg/dL     Total Cholesterol: 165 mg/dL

## 2016-08-28 LAB — LIPID PANEL
CHOLESTEROL: 154 mg/dL (ref 0–200)
HDL: 31.9 mg/dL — ABNORMAL LOW (ref >39.00)
LDL CALC: 92 mg/dL (ref 0–99)
NonHDL: 121.63
TRIGLYCERIDES: 148 mg/dL (ref 0.0–149.0)
Total CHOL/HDL Ratio: 5
VLDL: 29.6 mg/dL (ref 0.0–40.0)

## 2016-08-28 LAB — PSA: PSA: 1.63 ng/mL (ref 0.10–4.00)

## 2016-08-28 LAB — BASIC METABOLIC PANEL
BUN: 15 mg/dL (ref 6–23)
CALCIUM: 9.1 mg/dL (ref 8.4–10.5)
CO2: 27 mEq/L (ref 19–32)
CREATININE: 0.87 mg/dL (ref 0.40–1.50)
Chloride: 105 mEq/L (ref 96–112)
GFR: 92.37 mL/min (ref >60.00)
GLUCOSE: 100 mg/dL — AB (ref 70–99)
Potassium: 4.1 mEq/L (ref 3.5–5.1)
Sodium: 140 mEq/L (ref 135–145)

## 2016-09-17 DIAGNOSIS — H33193 Other retinoschisis and retinal cysts, bilateral: Secondary | ICD-10-CM | POA: Diagnosis not present

## 2016-09-17 DIAGNOSIS — H1851 Endothelial corneal dystrophy: Secondary | ICD-10-CM | POA: Diagnosis not present

## 2016-09-17 DIAGNOSIS — H2513 Age-related nuclear cataract, bilateral: Secondary | ICD-10-CM | POA: Diagnosis not present

## 2016-09-17 DIAGNOSIS — Z961 Presence of intraocular lens: Secondary | ICD-10-CM | POA: Diagnosis not present

## 2016-09-17 DIAGNOSIS — H43813 Vitreous degeneration, bilateral: Secondary | ICD-10-CM | POA: Diagnosis not present

## 2016-09-17 DIAGNOSIS — H34832 Tributary (branch) retinal vein occlusion, left eye: Secondary | ICD-10-CM | POA: Diagnosis not present

## 2016-09-20 ENCOUNTER — Encounter: Payer: Self-pay | Admitting: Family Medicine

## 2016-09-20 ENCOUNTER — Ambulatory Visit (INDEPENDENT_AMBULATORY_CARE_PROVIDER_SITE_OTHER): Payer: PPO | Admitting: Family Medicine

## 2016-09-20 VITALS — BP 118/82 | HR 63 | Ht 73.0 in | Wt 192.1 lb

## 2016-09-20 DIAGNOSIS — R21 Rash and other nonspecific skin eruption: Secondary | ICD-10-CM

## 2016-09-20 DIAGNOSIS — H579 Unspecified disorder of eye and adnexa: Secondary | ICD-10-CM | POA: Insufficient documentation

## 2016-09-20 NOTE — Assessment & Plan Note (Signed)
Unclear story - ?retinal vein occlusion - has f/u with retinologist. Suggested check with eye doctor about baby aspirin and statin.

## 2016-09-20 NOTE — Assessment & Plan Note (Signed)
Possible vasculitis vs irritant dermatitis. rec try steroid cream he has at home, if no better may f/u with dermatology. Reassured this was not consistent with DVT.

## 2016-09-20 NOTE — Patient Instructions (Signed)
For eye - start aspirin - check with retina doctor about aspirin and cholesterol medicine.  For rash - try steroid cream at home. If no better, return to see skin doctor.

## 2016-09-20 NOTE — Progress Notes (Signed)
BP 118/82 (BP Location: Right Arm, Patient Position: Sitting, Cuff Size: Normal)   Pulse 63   Ht 6\' 1"  (1.854 m)   Wt 192 lb 1.9 oz (87.1 kg)   SpO2 95%   BMI 25.35 kg/m    CC: R leg rash Subjective:    Patient ID: Jonathon Chavez, male    DOB: November 02, 1945, 71 y.o.   MRN: [id number]  HPI: Jonathon Chavez is a 71 y.o. male presenting on 09/20/2016 for Leg Problem (right leg, red rash on calf, he first noticed it on saturday, he has tried some otc medication, he states it is sore,)   3-4d h/o R posterior calf rash that is tender, red, swollen. Rash is not spreading. Treating with calamine lotion.  Denies inciting trauma/injury He did have chigger bites recently.  No new lotions, detergents, soap or shampoos.  No new foods or medicines.  No fevers/chills, nausea, joint pains, other rash beside known vitiligo  No fmhx blood clots in the past.   Saw eye doctor on Monday for blurred vision - found blocked vessel and referred to retinologist.   Relevant past medical, surgical, family and social history reviewed and updated as indicated. Interim medical history since our last visit reviewed. Allergies and medications reviewed and updated. Outpatient Medications Prior to Visit  Medication Sig Dispense Refill  . Multiple Vitamins-Minerals (EMERGEN-C IMMUNE PLUS PO) Take by mouth daily as needed.     No facility-administered medications prior to visit.      Per HPI unless specifically indicated in ROS section below Review of Systems     Objective:    BP 118/82 (BP Location: Right Arm, Patient Position: Sitting, Cuff Size: Normal)   Pulse 63   Ht 6\' 1"  (1.854 m)   Wt 192 lb 1.9 oz (87.1 kg)   SpO2 95%   BMI 25.35 kg/m   Wt Readings from Last 3 Encounters:  09/20/16 192 lb 1.9 oz (87.1 kg)  08/27/16 193 lb 12 oz (87.9 kg)  02/16/16 196 lb 12 oz (89.2 kg)   Physical Exam  Constitutional: He appears well-developed and well-nourished. No distress.  Musculoskeletal: He  exhibits no edema.  2+ DP bilaterally No pedal edema  Skin: Skin is warm and dry. Rash noted. There is erythema.  Non-blanching erythematous maculopapular rash isolated to R posterior calf  Nursing note and vitals reviewed.      Results for orders placed or performed in visit on 08/27/16  Lipid panel  Result Value Ref Range   Cholesterol 154 0 - 200 mg/dL   Triglycerides 148.0 0.0 - 149.0 mg/dL   HDL 31.90 (L) >39.00 mg/dL   VLDL 29.6 0.0 - 40.0 mg/dL   LDL Cholesterol 92 0 - 99 mg/dL   Total CHOL/HDL Ratio 5    NonHDL 573.22   Basic metabolic panel  Result Value Ref Range   Sodium 140 135 - 145 mEq/L   Potassium 4.1 3.5 - 5.1 mEq/L   Chloride 105 96 - 112 mEq/L   CO2 27 19 - 32 mEq/L   Glucose, Bld 100 (H) 70 - 99 mg/dL   BUN 15 6 - 23 mg/dL   Creatinine, Ser 0.87 0.40 - 1.50 mg/dL   Calcium 9.1 8.4 - 10.5 mg/dL   GFR 92.37 >60.00 mL/min  PSA  Result Value Ref Range   PSA 1.63 0.10 - 4.00 ng/mL      Assessment & Plan:   Problem List Items Addressed This Visit    Eye problem  Unclear story - ?retinal vein occlusion - has f/u with retinologist. Suggested check with eye doctor about baby aspirin and statin.       Skin rash - Primary    Possible vasculitis vs irritant dermatitis. rec try steroid cream he has at home, if no better may f/u with dermatology. Reassured this was not consistent with DVT.           Follow up plan: Return if symptoms worsen or fail to improve.  Ria Bush, MD

## 2016-09-24 DIAGNOSIS — H348322 Tributary (branch) retinal vein occlusion, left eye, stable: Secondary | ICD-10-CM | POA: Diagnosis not present

## 2016-09-24 DIAGNOSIS — H3562 Retinal hemorrhage, left eye: Secondary | ICD-10-CM | POA: Diagnosis not present

## 2016-09-24 DIAGNOSIS — H35042 Retinal micro-aneurysms, unspecified, left eye: Secondary | ICD-10-CM | POA: Diagnosis not present

## 2016-11-13 DIAGNOSIS — H35042 Retinal micro-aneurysms, unspecified, left eye: Secondary | ICD-10-CM | POA: Diagnosis not present

## 2016-11-13 DIAGNOSIS — H348322 Tributary (branch) retinal vein occlusion, left eye, stable: Secondary | ICD-10-CM | POA: Diagnosis not present

## 2016-11-13 DIAGNOSIS — H3562 Retinal hemorrhage, left eye: Secondary | ICD-10-CM | POA: Diagnosis not present

## 2016-12-03 DIAGNOSIS — H40013 Open angle with borderline findings, low risk, bilateral: Secondary | ICD-10-CM | POA: Diagnosis not present

## 2016-12-03 DIAGNOSIS — H1851 Endothelial corneal dystrophy: Secondary | ICD-10-CM | POA: Diagnosis not present

## 2016-12-03 DIAGNOSIS — Z961 Presence of intraocular lens: Secondary | ICD-10-CM | POA: Diagnosis not present

## 2016-12-03 DIAGNOSIS — H2513 Age-related nuclear cataract, bilateral: Secondary | ICD-10-CM | POA: Diagnosis not present

## 2016-12-03 DIAGNOSIS — H33193 Other retinoschisis and retinal cysts, bilateral: Secondary | ICD-10-CM | POA: Diagnosis not present

## 2016-12-03 DIAGNOSIS — H43813 Vitreous degeneration, bilateral: Secondary | ICD-10-CM | POA: Diagnosis not present

## 2016-12-10 ENCOUNTER — Ambulatory Visit: Payer: Self-pay

## 2016-12-10 NOTE — Telephone Encounter (Signed)
Stated he has days that he just feels lousy and has no energy.   Reason for Disposition . [2] Systolic BP  >= 549 OR Diastolic >= 90 AND [8] taking BP medications  Answer Assessment - Initial Assessment Questions 1. BLOOD PRESSURE: "What is the blood pressure?" "Did you take at least two measurements 5 minutes apart?"     160/92 when he woke up 144/97 @ 10:00 AM; 165/94 2. ONSET: "When did you take your blood pressure?"     See above 3. HOW: "How did you obtain the blood pressure?" (e.g., visiting nurse, automatic home BP monitor)     Automatic BP monitor 4. HISTORY: "Do you have a history of high blood pressure?"     Was on a BP medication about one year ago; didn't feel it was helping  5. MEDICATIONS: "Are you taking any medications for blood pressure?" "Have you missed any doses recently?"     Stopped taking about a year ago 6. OTHER SYMPTOMS: "Do you have any symptoms?" (e.g., headache, chest pain, blurred vision, difficulty breathing, weakness)     C/o headache at 2/10 that comes and goes; also c/o weakness and shortness of breath with activity; after about 15 minutes has to stop and rest.  Talking in complete sentences at this time.  Stated he gets short of breath with climbing stairs or physical activity, but feel he recovers easily.    7. PREGNANCY: "Is there any chance you are pregnant?" "When was your last menstrual period?"     n/a  Protocols used: HIGH BLOOD PRESSURE-A-AH

## 2016-12-11 ENCOUNTER — Encounter: Payer: Self-pay | Admitting: Family Medicine

## 2016-12-11 ENCOUNTER — Ambulatory Visit (INDEPENDENT_AMBULATORY_CARE_PROVIDER_SITE_OTHER): Payer: PPO | Admitting: Family Medicine

## 2016-12-11 VITALS — BP 140/80 | HR 79 | Temp 98.1°F | Wt 196.0 lb

## 2016-12-11 DIAGNOSIS — R5382 Chronic fatigue, unspecified: Secondary | ICD-10-CM

## 2016-12-11 DIAGNOSIS — I10 Essential (primary) hypertension: Secondary | ICD-10-CM | POA: Diagnosis not present

## 2016-12-11 MED ORDER — LISINOPRIL 10 MG PO TABS: 10.0000 mg | ORAL_TABLET | Freq: Every day | ORAL | 6 refills | Status: DC

## 2016-12-11 NOTE — Patient Instructions (Addendum)
Start lisinopril 10mg  daily. Return in 10 days for labs only.  Return in 6 weeks for blood pressure follow up.  Goal blood pressure <140/90.  Work on low salt/sodium diet - goal <1.5gm (1,500mg ) per day. Eat a diet high in fruits/vegetables and whole grains.  Look into mediterranean and DASH diet. Goal activity is 118min/wk of moderate intensity exercise.  This can be split into 30 minute chunks.  If you are not at this level, you can start with smaller 10-15 min increments and slowly build up activity. Look at [place].org for more resources   DASH Eating Plan DASH stands for "Dietary Approaches to Stop Hypertension." The DASH eating plan is a healthy eating plan that has been shown to reduce high blood pressure (hypertension). It may also reduce your risk for type 2 diabetes, heart disease, and stroke. The DASH eating plan may also help with weight loss. What are tips for following this plan? General guidelines  Avoid eating more than 2,300 mg (milligrams) of salt (sodium) a day. If you have hypertension, you may need to reduce your sodium intake to 1,500 mg a day.  Limit alcohol intake to no more than 1 drink a day for nonpregnant women and 2 drinks a day for men. One drink equals 12 oz of beer, 5 oz of wine, or 1 oz of hard liquor.  Work with your health care provider to maintain a healthy body weight or to lose weight. Ask what an ideal weight is for you.  Get at least 30 minutes of exercise that causes your heart to beat faster (aerobic exercise) most days of the week. Activities may include walking, swimming, or biking.  Work with your health care provider or diet and nutrition specialist (dietitian) to adjust your eating plan to your individual calorie needs. Reading food labels  Check food labels for the amount of sodium per serving. Choose foods with less than 5 percent of the Daily Value of sodium. Generally, foods with less than 300 mg of sodium per serving fit into this eating  plan.  To find whole grains, look for the word "whole" as the first word in the ingredient list. Shopping  Buy products labeled as "low-sodium" or "no salt added."  Buy fresh foods. Avoid canned foods and premade or frozen meals. Cooking  Avoid adding salt when cooking. Use salt-free seasonings or herbs instead of table salt or sea salt. Check with your health care provider or pharmacist before using salt substitutes.  Do not fry foods. Cook foods using healthy methods such as baking, boiling, grilling, and broiling instead.  Cook with heart-healthy oils, such as olive, canola, soybean, or sunflower oil. Meal planning   Eat a balanced diet that includes: ? 5 or more servings of fruits and vegetables each day. At each meal, try to fill half of your plate with fruits and vegetables. ? Up to 6-8 servings of whole grains each day. ? Less than 6 oz of lean meat, poultry, or fish each day. A 3-oz serving of meat is about the same size as a deck of cards. One egg equals 1 oz. ? 2 servings of low-fat dairy each day. ? A serving of nuts, seeds, or beans 5 times each week. ? Heart-healthy fats. Healthy fats called Omega-3 fatty acids are found in foods such as flaxseeds and coldwater fish, like sardines, salmon, and mackerel.  Limit how much you eat of the following: ? Canned or prepackaged foods. ? Food that is high in trans fat,  such as fried foods. ? Food that is high in saturated fat, such as fatty meat. ? Sweets, desserts, sugary drinks, and other foods with added sugar. ? Full-fat dairy products.  Do not salt foods before eating.  Try to eat at least 2 vegetarian meals each week.  Eat more home-cooked food and less restaurant, buffet, and fast food.  When eating at a restaurant, ask that your food be prepared with less salt or no salt, if possible. What foods are recommended? The items listed may not be a complete list. Talk with your dietitian about what dietary choices are best  for you. Grains Whole-grain or whole-wheat bread. Whole-grain or whole-wheat pasta. Brown rice. [place]. Bulgur. Whole-grain and low-sodium cereals. Pita bread. Low-fat, low-sodium crackers. Whole-wheat flour tortillas. Vegetables Fresh or frozen vegetables (raw, steamed, roasted, or grilled). Low-sodium or reduced-sodium tomato and vegetable juice. Low-sodium or reduced-sodium tomato sauce and tomato paste. Low-sodium or reduced-sodium canned vegetables. Fruits All fresh, dried, or frozen fruit. Canned fruit in natural juice (without added sugar). Meat and other protein foods Skinless chicken or [place]. Ground chicken or [place]. Pork with fat trimmed off. Fish and seafood. Egg whites. Dried beans, peas, or lentils. Unsalted nuts, nut butters, and seeds. Unsalted canned beans. Lean cuts of beef with fat trimmed off. Low-sodium, lean deli meat. Dairy Low-fat (1%) or fat-free (skim) milk. Fat-free, low-fat, or reduced-fat cheeses. Nonfat, low-sodium ricotta or cottage cheese. Low-fat or nonfat yogurt. Low-fat, low-sodium cheese. Fats and oils Soft margarine without trans fats. Vegetable oil. Low-fat, reduced-fat, or light mayonnaise and salad dressings (reduced-sodium). Canola, safflower, olive, soybean, and sunflower oils. Avocado. Seasoning and other foods Herbs. Spices. Seasoning mixes without salt. Unsalted popcorn and pretzels. Fat-free sweets. What foods are not recommended? The items listed may not be a complete list. Talk with your dietitian about what dietary choices are best for you. Grains Baked goods made with fat, such as croissants, muffins, or some breads. Dry pasta or rice meal packs. Vegetables Creamed or fried vegetables. Vegetables in a cheese sauce. Regular canned vegetables (not low-sodium or reduced-sodium). Regular canned tomato sauce and paste (not low-sodium or reduced-sodium). Regular tomato and vegetable juice (not low-sodium or reduced-sodium). Angie Fava.  Olives. Fruits Canned fruit in a light or heavy syrup. Fried fruit. Fruit in cream or butter sauce. Meat and other protein foods Fatty cuts of meat. Ribs. Fried meat. Berniece Salines. Sausage. Bologna and other processed lunch meats. Salami. Fatback. Hotdogs. Bratwurst. Salted nuts and seeds. Canned beans with added salt. Canned or smoked fish. Whole eggs or egg yolks. Chicken or [place] with skin. Dairy Whole or 2% milk, cream, and half-and-half. Whole or full-fat cream cheese. Whole-fat or sweetened yogurt. Full-fat cheese. Nondairy creamers. Whipped toppings. Processed cheese and cheese spreads. Fats and oils Butter. Stick margarine. Lard. Shortening. Ghee. Bacon fat. Tropical oils, such as coconut, palm kernel, or palm oil. Seasoning and other foods Salted popcorn and pretzels. Onion salt, garlic salt, seasoned salt, table salt, and sea salt. Worcestershire sauce. Tartar sauce. Barbecue sauce. Teriyaki sauce. Soy sauce, including reduced-sodium. Steak sauce. Canned and packaged gravies. Fish sauce. Oyster sauce. Cocktail sauce. Horseradish that you find on the shelf. Ketchup. Mustard. Meat flavorings and tenderizers. Bouillon cubes. Hot sauce and Tabasco sauce. Premade or packaged marinades. Premade or packaged taco seasonings. Relishes. Regular salad dressings. Where to find more information:  National Heart, Lung, and [place]: https://wilson-eaton.com/  American Heart Association: www.heart.org Summary  The DASH eating plan is a healthy eating plan that has been  shown to reduce high blood pressure (hypertension). It may also reduce your risk for type 2 diabetes, heart disease, and stroke.  With the DASH eating plan, you should limit salt (sodium) intake to 2,300 mg a day. If you have hypertension, you may need to reduce your sodium intake to 1,500 mg a day.  When on the DASH eating plan, aim to eat more fresh fruits and vegetables, whole grains, lean proteins, low-fat dairy, and heart-healthy  fats.  Work with your health care provider or diet and nutrition specialist (dietitian) to adjust your eating plan to your individual calorie needs. This information is not intended to replace advice given to you by your health care provider. Make sure you discuss any questions you have with your health care provider. Document Released: 01/25/2011 Document Revised: 01/30/2016 Document Reviewed: 01/30/2016 Elsevier Interactive Patient Education  2017 [employer].

## 2016-12-11 NOTE — Progress Notes (Signed)
BP 140/80 (BP Location: Left Arm, Patient Position: Sitting, Cuff Size: Normal)   Pulse 79   Temp 98.1 F (36.7 C) (Oral)   Wt 196 lb (88.9 kg)   SpO2 95%   BMI 25.86 kg/m    CC: hypertension, fatigue Subjective:    Patient ID: Jonathon Chavez, male    DOB: Aug 02, 1945, 71 y.o.   MRN: [id number]  HPI: Jonathon Chavez is a 71 y.o. male presenting on 12/11/2016 for Hypertension (has noticed BP increasing, especially when waking in the morning. C/o HA. Started last year but thought pt was stressed at that time due to caring for elderly father. However, father has passed and BP still elevated); Fatigue (Started months ago); and Shortness of Breath (Noticed within last few when active)   Recently retired.   Brings bp log over last few weeks consistently 130-170s/80-90s, HR 80s. Ongoing over the past year.  Denies CP/tightness, leg swelling. Intermittent headaches. Increasing dyspnea with exertion (going up a flight of stairs).   He already stays active 1 hr on treadmill every as well as resistance training daily.   H/o L eye vessel occlusion followed by eye doctor. Unclear details but it doesn't sound like arterial occlusion.  Increasing fatigue recently.  Amlodipine didn't help blood pressure when previously tried.   Relevant past medical, surgical, family and social history reviewed and updated as indicated. Interim medical history since our last visit reviewed. Allergies and medications reviewed and updated. Outpatient Medications Prior to Visit  Medication Sig Dispense Refill  . aspirin EC 81 MG tablet Take 81 mg by mouth daily.    . Multiple Vitamins-Minerals (EMERGEN-C IMMUNE PLUS PO) Take by mouth daily as needed.     No facility-administered medications prior to visit.      Per HPI unless specifically indicated in ROS section below Review of Systems     Objective:    BP 140/80 (BP Location: Left Arm, Patient Position: Sitting, Cuff Size: Normal)   Pulse 79   Temp  98.1 F (36.7 C) (Oral)   Wt 196 lb (88.9 kg)   SpO2 95%   BMI 25.86 kg/m   Wt Readings from Last 3 Encounters:  12/11/16 196 lb (88.9 kg)  09/20/16 192 lb 1.9 oz (87.1 kg)  08/27/16 193 lb 12 oz (87.9 kg)    Physical Exam  Constitutional: He appears well-developed and well-nourished. No distress.  HENT:  Mouth/Throat: Oropharynx is clear and moist. No oropharyngeal exudate.  Neck: No thyromegaly present.  Cardiovascular: Normal rate, regular rhythm, normal heart sounds and intact distal pulses.   No murmur heard. Pulmonary/Chest: Effort normal and breath sounds normal. No respiratory distress. He has no wheezes. He has no rales.  Musculoskeletal: He exhibits no edema.  Skin: Skin is warm and dry. No rash noted.  Psychiatric: He has a normal mood and affect.  Nursing note and vitals reviewed.  Results for orders placed or performed in visit on 08/27/16  Lipid panel  Result Value Ref Range   Cholesterol 154 0 - 200 mg/dL   Triglycerides 148.0 0.0 - 149.0 mg/dL   HDL 31.90 (L) >39.00 mg/dL   VLDL 29.6 0.0 - 40.0 mg/dL   LDL Cholesterol 92 0 - 99 mg/dL   Total CHOL/HDL Ratio 5    NonHDL 761.95   Basic metabolic panel  Result Value Ref Range   Sodium 140 135 - 145 mEq/L   Potassium 4.1 3.5 - 5.1 mEq/L   Chloride 105 96 - 112 mEq/L  CO2 27 19 - 32 mEq/L   Glucose, Bld 100 (H) 70 - 99 mg/dL   BUN 15 6 - 23 mg/dL   Creatinine, Ser 0.87 0.40 - 1.50 mg/dL   Calcium 9.1 8.4 - 10.5 mg/dL   GFR 92.37 >60.00 mL/min  PSA  Result Value Ref Range   PSA 1.63 0.10 - 4.00 ng/mL      Assessment & Plan:   Problem List Items Addressed This Visit    Fatigue    Ongoing, intermittent. I did ask him to return 10d for Cr check and will also eval for other reversible causes of fatigue.       Relevant Orders   Hepatic function panel   TSH   CBC with Differential/Platelet   Vitamin B12   VITAMIN D 25 Hydroxy (Vit-D Deficiency, Fractures)   Hypertension, essential - Primary    New  diagnosis, consistently elevated readings at home based on log he brings.  Will start lisinopril 10mg  daily, discussed common side effects and monitoring for allergic reaction angioedema. I did ask him to return in 10d for lab visit only to recheck Cr Reviewed healthy diet and lifestyle recommendations to achieve blood pressure control - see handout provided today.  RTC 6wks f/u visit.       Relevant Medications   lisinopril (PRINIVIL,ZESTRIL) 10 MG tablet   Other Relevant Orders   TSH   Basic metabolic panel       Follow up plan: Return in about 6 weeks (around 01/22/2017), or if symptoms worsen or fail to improve, for follow up visit.  Ria Bush, MD

## 2016-12-12 DIAGNOSIS — I10 Essential (primary) hypertension: Secondary | ICD-10-CM | POA: Insufficient documentation

## 2016-12-12 NOTE — Assessment & Plan Note (Addendum)
New diagnosis, consistently elevated readings at home based on log he brings.  Will start lisinopril 10mg  daily, discussed common side effects and monitoring for allergic reaction angioedema. I did ask him to return in 10d for lab visit only to recheck Cr Reviewed healthy diet and lifestyle recommendations to achieve blood pressure control - see handout provided today.  RTC 6wks f/u visit.

## 2016-12-12 NOTE — Assessment & Plan Note (Signed)
Ongoing, intermittent. I did ask him to return 10d for Cr check and will also eval for other reversible causes of fatigue.

## 2016-12-31 DIAGNOSIS — H348322 Tributary (branch) retinal vein occlusion, left eye, stable: Secondary | ICD-10-CM | POA: Diagnosis not present

## 2016-12-31 DIAGNOSIS — H3562 Retinal hemorrhage, left eye: Secondary | ICD-10-CM | POA: Diagnosis not present

## 2016-12-31 DIAGNOSIS — Z961 Presence of intraocular lens: Secondary | ICD-10-CM | POA: Diagnosis not present

## 2016-12-31 DIAGNOSIS — H35042 Retinal micro-aneurysms, unspecified, left eye: Secondary | ICD-10-CM | POA: Diagnosis not present

## 2017-01-23 ENCOUNTER — Ambulatory Visit (INDEPENDENT_AMBULATORY_CARE_PROVIDER_SITE_OTHER): Payer: PPO | Admitting: Family Medicine

## 2017-01-23 ENCOUNTER — Encounter: Payer: Self-pay | Admitting: Family Medicine

## 2017-01-23 VITALS — BP 136/86 | HR 78 | Temp 97.9°F | Wt 192.0 lb

## 2017-01-23 DIAGNOSIS — R5382 Chronic fatigue, unspecified: Secondary | ICD-10-CM

## 2017-01-23 DIAGNOSIS — I10 Essential (primary) hypertension: Secondary | ICD-10-CM | POA: Diagnosis not present

## 2017-01-23 LAB — CBC WITH DIFFERENTIAL/PLATELET
Basophils Absolute: 0 10*3/uL (ref 0.0–0.1)
Basophils Relative: 0.4 % (ref 0.0–3.0)
EOS PCT: 4.7 % (ref 0.0–5.0)
Eosinophils Absolute: 0.3 10*3/uL (ref 0.0–0.7)
HCT: 46 % (ref 39.0–52.0)
Hemoglobin: 15.5 g/dL (ref 13.0–17.0)
LYMPHS PCT: 25.5 % (ref 12.0–46.0)
Lymphs Abs: 1.8 10*3/uL (ref 0.7–4.0)
MCHC: 33.8 g/dL (ref 30.0–36.0)
MCV: 88.2 fl (ref 78.0–100.0)
MONO ABS: 0.6 10*3/uL (ref 0.1–1.0)
MONOS PCT: 9.3 % (ref 3.0–12.0)
NEUTROS PCT: 60.1 % (ref 43.0–77.0)
Neutro Abs: 4.1 10*3/uL (ref 1.4–7.7)
PLATELETS: 195 10*3/uL (ref 150.0–400.0)
RBC: 5.22 Mil/uL (ref 4.22–5.81)
RDW: 12.9 % (ref 11.5–15.5)
WBC: 6.9 10*3/uL (ref 4.0–10.5)

## 2017-01-23 LAB — BASIC METABOLIC PANEL
BUN: 12 mg/dL (ref 6–23)
CALCIUM: 9.3 mg/dL (ref 8.4–10.5)
CHLORIDE: 99 meq/L (ref 96–112)
CO2: 33 meq/L — AB (ref 19–32)
Creatinine, Ser: 1.03 mg/dL (ref 0.40–1.50)
GFR: 75.93 mL/min (ref >60.00)
GLUCOSE: 115 mg/dL — AB (ref 70–99)
POTASSIUM: 4.8 meq/L (ref 3.5–5.1)
SODIUM: 138 meq/L (ref 135–145)

## 2017-01-23 LAB — HEPATIC FUNCTION PANEL
ALBUMIN: 4.3 g/dL (ref 3.5–5.2)
ALK PHOS: 46 U/L (ref 39–117)
ALT: 28 U/L (ref 0–53)
AST: 22 U/L (ref 0–37)
Bilirubin, Direct: 0.2 mg/dL (ref 0.0–0.3)
Total Bilirubin: 1.1 mg/dL (ref 0.2–1.2)
Total Protein: 7.2 g/dL (ref 6.0–8.3)

## 2017-01-23 LAB — VITAMIN B12

## 2017-01-23 LAB — VITAMIN D 25 HYDROXY (VIT D DEFICIENCY, FRACTURES): VITD: 37.65 ng/mL (ref 30.00–100.00)

## 2017-01-23 LAB — TSH: TSH: 4.13 u[IU]/mL (ref 0.35–4.50)

## 2017-01-23 NOTE — Progress Notes (Signed)
BP 136/86 (BP Location: Right Arm, Cuff Size: Large)   Pulse 78   Temp 97.9 F (36.6 C) (Oral)   Wt 192 lb (87.1 kg)   SpO2 97%   BMI 25.33 kg/m    CC: BP f/u visit Subjective:    Patient ID: Jonathon Chavez, male    DOB: [birth year], 71 y.o.   MRN: [id number]  HPI: Jonathon Chavez is a 71 y.o. male presenting on 01/23/2017 for Hypertension (follow-up.)   See prior note for details.  Seen here 11/2016 with noted elevated blood pressures with increased dyspnea on exertion. We started lisinopril 10mg  daily. He did not return for lab visit as planned. No HA, vision changes, CP/tightness, leg swelling. Dyspnea on exertion persists - noticed after walking a flight of stairs. He has started using eliptical 30 min daily.   Readings remain elevated at home - brings log showing bp 130-150/80s, HR 50-70s.   Getting over a cough.   Relevant past medical, surgical, family and social history reviewed and updated as indicated. Interim medical history since our last visit reviewed. Allergies and medications reviewed and updated. Outpatient Medications Prior to Visit  Medication Sig Dispense Refill  . aspirin EC 81 MG tablet Take 81 mg by mouth daily.    . Cholecalciferol (VITAMIN D3 PO) Take by mouth.    . Cyanocobalamin (VITAMIN B12 PO) Take by mouth.    Marland Kitchen lisinopril (PRINIVIL,ZESTRIL) 10 MG tablet Take 1 tablet (10 mg total) by mouth daily. 30 tablet 6  . MAGNESIUM OXIDE PO Take by mouth.    . Multiple Vitamins-Minerals (EMERGEN-C IMMUNE PLUS PO) Take by mouth daily as needed.     No facility-administered medications prior to visit.      Per HPI unless specifically indicated in ROS section below Review of Systems     Objective:    BP 136/86 (BP Location: Right Arm, Cuff Size: Large)   Pulse 78   Temp 97.9 F (36.6 C) (Oral)   Wt 192 lb (87.1 kg)   SpO2 97%   BMI 25.33 kg/m   Wt Readings from Last 3 Encounters:  01/23/17 192 lb (87.1 kg)  12/11/16 196 lb (88.9 kg)    09/20/16 192 lb 1.9 oz (87.1 kg)    Physical Exam  Constitutional: He appears well-developed and well-nourished. No distress.  HENT:  Mouth/Throat: Oropharynx is clear and moist. No oropharyngeal exudate.  Cardiovascular: Normal rate, regular rhythm, normal heart sounds and intact distal pulses.  No murmur heard. Pulmonary/Chest: Effort normal and breath sounds normal. No respiratory distress. He has no wheezes. He has no rales.  Musculoskeletal: He exhibits no edema.  Nursing note and vitals reviewed.  Results for orders placed or performed in visit on 08/27/16  Lipid panel  Result Value Ref Range   Cholesterol 154 0 - 200 mg/dL   Triglycerides 148.0 0.0 - 149.0 mg/dL   HDL 31.90 (L) >39.00 mg/dL   VLDL 29.6 0.0 - 40.0 mg/dL   LDL Cholesterol 92 0 - 99 mg/dL   Total CHOL/HDL Ratio 5    NonHDL 782.95   Basic metabolic panel  Result Value Ref Range   Sodium 140 135 - 145 mEq/L   Potassium 4.1 3.5 - 5.1 mEq/L   Chloride 105 96 - 112 mEq/L   CO2 27 19 - 32 mEq/L   Glucose, Bld 100 (H) 70 - 99 mg/dL   BUN 15 6 - 23 mg/dL   Creatinine, Ser 0.87 0.40 - 1.50 mg/dL  Calcium 9.1 8.4 - 10.5 mg/dL   GFR 92.37 >60.00 mL/min  PSA  Result Value Ref Range   PSA 1.63 0.10 - 4.00 ng/mL      Assessment & Plan:   Problem List Items Addressed This Visit    Fatigue    This is overall improved. Regardless, will check labwork today for reversible causes of fatigue, dyspnea.       Hypertension, essential - Primary    Tolerating lisinopril 10mg  well - continue. Update labs today.  In office cuff 25 pts lower than home cuff - anticipate part of issue if inaccurate BP cuffs.  Recommend buy new cuff, continue to monitor BP at home and update [place] if persistently >140/90.  Reviewed other healthy diet and lifestyle changes. Continue regular aerobic exercise.  Reviewed EKG from 08/2016.          Follow up plan: Return if symptoms worsen or fail to improve.  Ria Bush, MD

## 2017-01-23 NOTE — Patient Instructions (Addendum)
Labs today (future orders) Continue lisinopril 10mg  daily.  Keep a check on blood pressures and let me know if staying >140/90. Watch salt and caffeine.

## 2017-01-23 NOTE — Assessment & Plan Note (Signed)
This is overall improved. Regardless, will check labwork today for reversible causes of fatigue, dyspnea.

## 2017-01-23 NOTE — Addendum Note (Signed)
Addended by: Ellamae Sia on: 01/23/2017 10:44 AM   Modules accepted: Orders

## 2017-01-23 NOTE — Assessment & Plan Note (Addendum)
Tolerating lisinopril 10mg  well - continue. Update labs today.  In office cuff 25 pts lower than home cuff - anticipate part of issue if inaccurate BP cuffs.  Recommend buy new cuff, continue to monitor BP at home and update [place] if persistently >140/90.  Reviewed other healthy diet and lifestyle changes. Continue regular aerobic exercise.  Reviewed EKG from 08/2016.

## 2017-03-08 ENCOUNTER — Ambulatory Visit (INDEPENDENT_AMBULATORY_CARE_PROVIDER_SITE_OTHER)
Admission: RE | Admit: 2017-03-08 | Discharge: 2017-03-08 | Disposition: A | Discharge status: Home / Self Care | Payer: PPO | Source: Ambulatory Visit | Attending: Family Medicine | Admitting: Family Medicine

## 2017-03-08 ENCOUNTER — Encounter: Payer: Self-pay | Admitting: Family Medicine

## 2017-03-08 ENCOUNTER — Ambulatory Visit (INDEPENDENT_AMBULATORY_CARE_PROVIDER_SITE_OTHER): Payer: PPO | Admitting: Family Medicine

## 2017-03-08 VITALS — BP 124/78 | HR 69 | Temp 98.1°F | Wt 198.0 lb

## 2017-03-08 DIAGNOSIS — M25511 Pain in right shoulder: Secondary | ICD-10-CM

## 2017-03-08 MED ORDER — TRAMADOL HCL 50 MG PO TABS: 50.0000 mg | ORAL_TABLET | Freq: Three times a day (TID) | ORAL | 0 refills | Status: DC | PRN

## 2017-03-08 NOTE — Progress Notes (Signed)
R shoulder pain.  Started about 3 weeks ago.  Gradually worse in the meantime.  Pain can be anterior or posterior on the R shoulder.   Used liniment in the meantime and ibuprofen w/o relief.  Pain sleeping on R side.  He has pain with AROM but PROM intact.  No specific injury.   He noted a knot in the neck, on the R side, a few days ago.    He exercises with resistance at home, usually, before he started having shoulder pain.  R handed.  No L sided sx.  Retired.    Meds, vitals, and allergies reviewed.   ROS: Per HPI unless specifically indicated in ROS section   nad ncat Neck supple, but with muscle tenderness along the right side of the trapezius, and the neck.  No midline neck pain.  No lymphadenopathy. rrr Ctab Right shoulder with pain on range of motion.  Pain with external rotation greater than internal rotation.  No pain with passive range of motion but active range of motion is painful esp when he abducts the arm.  Positive impingement.  Distally neurovascularly intact with normal grip.  AC joint nontender. Scap assist decrease his pain on external rotation.

## 2017-03-08 NOTE — Patient Instructions (Signed)
Go to the lab on the way out.  We'll contact you with your xray report. Rosaria Ferries will call about your referral. Take care.  Glad to see you.  Take tramadol for pain as needed.

## 2017-03-10 DIAGNOSIS — M25511 Pain in right shoulder: Secondary | ICD-10-CM | POA: Insufficient documentation

## 2017-03-10 DIAGNOSIS — G8929 Other chronic pain: Secondary | ICD-10-CM | POA: Insufficient documentation

## 2017-03-10 NOTE — Assessment & Plan Note (Signed)
Likely rotator cuff symptoms.  Anatomy discussed with patient.  Would defer injection at this point.  Check plain films today.  Refer to PT.  If not better with that then we can either refer to orthopedics or sports medicine.  I would start with PT first since he did have some improvement in pain on range of motion with scapular manipulation.  Tramadol as needed for pain.  Okay for outpatient follow-up.

## 2017-03-11 ENCOUNTER — Telehealth: Payer: Self-pay | Admitting: *Deleted

## 2017-03-11 MED ORDER — MELOXICAM 15 MG PO TABS: 15.0000 mg | ORAL_TABLET | Freq: Every day | ORAL | 0 refills | Status: DC

## 2017-03-11 NOTE — Telephone Encounter (Signed)
Please call pt.   I talked with PCP about this.  Stop tramadol, try meloxicam instead.  rx sent. Take with food.  It is an nsaid like ibuprofen but may be more effective.  If not better with that, then needs f/u with Dr. Lorelei Pont sooner rather than later (or referral to ortho). Thanks.

## 2017-03-11 NOTE — Telephone Encounter (Signed)
Patient and wife advised

## 2017-03-11 NOTE — Telephone Encounter (Signed)
Patient says that the Tramadol seems to make him feel sleepy and flu-like symptoms.  Patient says it does help the pain but doesn't make it completely go away.  Please advise.

## 2017-03-14 ENCOUNTER — Ambulatory Visit: Payer: Self-pay | Admitting: *Deleted

## 2017-03-14 NOTE — Telephone Encounter (Signed)
Pt   Wife  Called  Back   And   conference in  With  Henry and pt   Rescheduled his  appt   With   His    Danise Mina  His  PCP   For  tommorow     -   Dr  Lorelei Pont     Not  Available   This   Week

## 2017-03-14 NOTE — Telephone Encounter (Signed)
Pt    Has    Shoulder  Problems   Has  Been  Seeing    Dr   Damita Dunnings  For   Shoulder pain    And   Has  Now   Developed  hemmoriods      Pt has  An  Appointment  Made  Already  tommorow   With  Dahlia Client     Pt has  Been taking  Ultram  And  It  Has  Been making  Him  Somewhat  Agitated has been changed to  meloxicam    Pt given  Instructions  To not  Strain  When  He  Has  A  bm   And   To  Use  Moist  Toilets   When  He  Wipes    He  Reports  The   Shoulder continues  To  Cause  Him pain  As  Well    Reason for Disposition . [1] Home treatment > 3 days for rectal pain AND [2] not improved  Answer Assessment - Initial Assessment Questions 1. SYMPTOM:  "What's the main symptom you're concerned about?" (e.g., pain, itching, swelling, rash)    Pain    Rectal  Bleeding  Bright  Using   Pad  Blood  Is  On the  Pain    2. ONSET: "When did the ________  start?"      2  Days     3. RECTAL PAIN: "Do you have any pain around your rectum?" "How bad is the pain?"  (Scale 1-10; or mild, moderate, severe)  - MILD (1-3): doesn't interfere with normal activities   - MODERATE (4-7): interferes with normal activities or awakens from sleep, limping   - SEVERE (8-10): excruciating pain, unable to have a bowel movement      Moderate   4. RECTAL ITCHING: "Do you have any itching in this area?" "How bad is the itching?"  (Scale 1-10; or mild, moderate, severe)  - MILD - doesn't interfere with normal activities   - MODERATE-SEVERE: interferes with normal activities or awakens from sleep     Does  Not itch  But burns    5. CONSTIPATION: "Do you have constipation?" If so, "How bad is it?"      Yes     Moderate    6. CAUSE: "What do you think is causing the anus symptoms?"      Has    Hemmoriods  In past    7. OTHER SYMPTOMS: "Do you have any other symptoms?"  (e.g., rectal bleeding, abdominal pain, vomiting, fever)     Rectal bleeding        8. PREGNANCY: "Is there any chance you are pregnant?" "When was your  last menstrual period?"        N/a  Protocols used: RECTAL [facility]

## 2017-03-15 ENCOUNTER — Ambulatory Visit: Payer: PPO | Admitting: Family Medicine

## 2017-03-15 ENCOUNTER — Ambulatory Visit (INDEPENDENT_AMBULATORY_CARE_PROVIDER_SITE_OTHER): Payer: PPO | Admitting: Family Medicine

## 2017-03-15 ENCOUNTER — Encounter: Payer: Self-pay | Admitting: Family Medicine

## 2017-03-15 VITALS — BP 122/70 | HR 71 | Temp 97.9°F | Wt 196.0 lb

## 2017-03-15 DIAGNOSIS — M25511 Pain in right shoulder: Secondary | ICD-10-CM

## 2017-03-15 NOTE — Patient Instructions (Signed)
Continue meloxicam 15mg  daily. May use tramadol 1/2-1 tab at bedtime Ice/heat whichever soothes better Exercises provided today Try therapy and update me with effect.  If not improving we will refer you to ortho.

## 2017-03-15 NOTE — Assessment & Plan Note (Addendum)
Exam again consistent with RTC pathology - discussed with patient. Not responding to mobic, tramadol too sedating, did not improve with ice/heat. Has first PT session planned for Thursday. rec try this and update me with effect. If ongoing or worsening pain, will refer to ortho. Pt agrees with plan.  For now, continue mobic 15mg  QAM, tramadol 1/2-1 tab limited to night time (discussed using bowel regimen with opiate) and tylenol PRN. Provided with exercises for RTC injury from [facility] pt advisor

## 2017-03-15 NOTE — Progress Notes (Signed)
BP 122/70 (BP Location: Left Arm, Patient Position: Sitting, Cuff Size: Normal)   Pulse 71   Temp 97.9 F (36.6 C) (Oral)   Wt 196 lb (88.9 kg)   SpO2 97%   BMI 25.86 kg/m    CC: R shoulder pain Subjective:    Patient ID: Jonathon Chavez, male    DOB: [birth year], 72 y.o.   MRN: [id number]  HPI: Jonathon Chavez is a 72 y.o. male presenting on 03/15/2017 for Shoulder Pain (Started about 1 mo ago.  Occurs with any movement during the day. Pain is constant during sleep. Pain meds and ibuprofen not helping. )   R shoulder pain over the past month. Denies inciting trauma or injury. He had been working out more regularly on total gym. Saw Dr Damita Dunnings with stable xrays (mild DJD at [facility] joint), referred to PT. Thought RTC pathology. Main concern today is inability to sleep due to pain - only able to sleep prone with arm hanging off bed. Ongoing pain at posteiror shoulder. Some neck pain as well as new lump noted R lateral neck. No radiculopathy down arm.   Tramadol caused hemorrhoid flare and constipation. He has largely stopped this. Now on mexolicam 15mg  daily. Has tried ice/heating pad.   Relevant past medical, surgical, family and social history reviewed and updated as indicated. Interim medical history since our last visit reviewed. Allergies and medications reviewed and updated. Outpatient Medications Prior to Visit  Medication Sig Dispense Refill  . aspirin EC 81 MG tablet Take 81 mg by mouth daily.    . Cholecalciferol (VITAMIN D3 PO) Take by mouth.    . Cyanocobalamin (VITAMIN B12 PO) Take by mouth.    Marland Kitchen lisinopril (PRINIVIL,ZESTRIL) 10 MG tablet Take 1 tablet (10 mg total) by mouth daily. 30 tablet 6  . MAGNESIUM OXIDE PO Take by mouth.    . meloxicam (MOBIC) 15 MG tablet Take 1 tablet (15 mg total) by mouth daily. With food. 30 tablet 0  . Multiple Vitamins-Minerals (EMERGEN-C IMMUNE PLUS PO) Take by mouth daily as needed.     No facility-administered medications prior to visit.        Per HPI unless specifically indicated in ROS section below Review of Systems     Objective:    BP 122/70 (BP Location: Left Arm, Patient Position: Sitting, Cuff Size: Normal)   Pulse 71   Temp 97.9 F (36.6 C) (Oral)   Wt 196 lb (88.9 kg)   SpO2 97%   BMI 25.86 kg/m   Wt Readings from Last 3 Encounters:  03/15/17 196 lb (88.9 kg)  03/08/17 198 lb (89.8 kg)  01/23/17 192 lb (87.1 kg)    Physical Exam  Constitutional: He appears well-developed and well-nourished. No distress.  Musculoskeletal: He exhibits no edema.  L shoulder WNL R shoulder exam: No deformity of shoulders on inspection. Mild discomfort to palpation of shoulder at [facility] joint at bursae and anterior shoulder Full passive ROM in abduction and forward flexion. Limited active ROM due to pain, but negative drop arm test + pain/weakness with testing SITS in ext rotation. + pain with empty can sign. Neg Speed test. + pain with impingement. No pain with crossover test. + pain with rotation of humeral head in [facility] joint.   Skin: Skin is warm and dry. No rash noted. No erythema.  Small firm circumscribed mass R lateral neck - anticipate lipoma  Nursing note and vitals reviewed.      Assessment & Plan:  Problem List Items Addressed This Visit    Right shoulder pain - Primary    Exam again consistent with RTC pathology - discussed with patient. Not responding to mobic, tramadol too sedating, did not improve with ice/heat. Has first PT session planned for Thursday. rec try this and update me with effect. If ongoing or worsening pain, will refer to ortho. Pt agrees with plan.  For now, continue mobic 15mg  QAM, tramadol 1/2-1 tab limited to night time (discussed using bowel regimen with opiate) and tylenol PRN. Provided with exercises for RTC injury from [facility] pt advisor          Follow up plan: Return if symptoms worsen or fail to improve.  Ria Bush, MD

## 2017-03-21 ENCOUNTER — Encounter: Payer: Self-pay | Admitting: Physical Therapy

## 2017-03-21 ENCOUNTER — Ambulatory Visit: Payer: PPO | Attending: Family Medicine | Admitting: Physical Therapy

## 2017-03-21 DIAGNOSIS — M25611 Stiffness of right shoulder, not elsewhere classified: Secondary | ICD-10-CM | POA: Diagnosis not present

## 2017-03-21 DIAGNOSIS — M25511 Pain in right shoulder: Secondary | ICD-10-CM | POA: Diagnosis not present

## 2017-03-21 NOTE — Progress Notes (Signed)
Agree.  Thanks.  Proceed as above.  Jonathon Chavez 03/21/17 10:10 PM

## 2017-03-21 NOTE — Therapy (Signed)
[place] MAIN [facility] SERVICES [address] [place], [place], [address] Phone: [phone]   Fax:  [phone]  Physical Therapy Evaluation  Patient Details  Name: Jonathon Chavez MRN: [id number] Date of Birth: Sep 15, 1945 Referring Provider: Tonia Ghent   Encounter Date: 03/21/2017  PT End of Session - 03/21/17 1535    Visit Number  1    Number of Visits  17    Date for PT Re-Evaluation  05/16/17    PT Start Time  0315    PT Stop Time  0415    PT Time Calculation (min)  60 min    Activity Tolerance  Patient limited by pain    Behavior During Therapy  [facility] for tasks assessed/performed       Past Medical History:  Diagnosis Date  . BPH (benign prostatic hypertrophy)   . GERD (gastroesophageal reflux disease)   . Hemorrhoids   . Hypertension     Past Surgical History:  Procedure Laterality Date  . CATARACT EXTRACTION Bilateral 2018   Dr Clent Jacks  . CHOLECYSTECTOMY  8/14   Dr Pat Patrick  . COLONOSCOPY  03/2013   sessile serrated polyp, diverticulosis, rpt 5 yrs (Pyrtle)    There were no vitals filed for this visit.   Subjective Assessment - 03/21/17 1526    Subjective  Patient is having right shoulder pain for 2 months and it is intermittent and at night. It ranges from 0/10- 8/10.     Patient is accompained by:  Family member    Pertinent History  Patient began having pain in right shoulder 2 months ago and was using a total gym at home and began having right shoulder pain.     Diagnostic tests  x ray    Patient Stated Goals  Patient wants to get rid of his right shoulder pain.     Currently in Pain?  No/denies    Pain Score  5     Pain Location  Shoulder    Pain Orientation  Right    Pain Descriptors / Indicators  Sharp    Pain Type  Chronic pain    Pain Onset  More than a month ago    Pain Frequency  Intermittent    Aggravating Factors   movement    Pain Relieving Factors  rest    Effect of Pain on Daily Activities   unable to do as much with his right hand     Multiple Pain Sites  No         [facility] PT Assessment - 03/21/17 1530      Assessment   Medical Diagnosis  R shoulder pain    Referring Provider  Elsie Stain S    Onset Date/Surgical Date  03/08/17    Hand Dominance  Right    Prior Therapy  PT      Precautions   Precautions  None      Restrictions   Weight Bearing Restrictions  No      Balance Screen   Has the patient fallen in the past 6 months  No    Has the patient had a decrease in activity level because of a fear of falling?   No    Is the patient reluctant to leave their home because of a fear of falling?   No      Home Film/video editor residence    Living Arrangements  Spouse/significant other  Available Help at Discharge  Family    Type of [place] Access  Level entry    Home Layout  Two level    Alternate Level Stairs-Number of Steps  12    Alternate Level Stairs-Rails  Right;Left    Home Equipment  None      Prior Function   Level of Independence  Independent;Independent with basic ADLs    Vocation  Retired    Leisure  walk      Cognition   Overall Cognitive Status  Within Functional Limits for tasks assessed      .    POSTURE: WNL   PROM/AROM: PROM is [facility] and all motions painful  Painful arc right arm abd 90-160 deg  STRENGTH:  Graded on a 0-5 scale Muscle Group Left Right  Shoulder flex 5/5 3/5 *  Shoulder Abd 5/5 3/5 *  Shoulder Ext 5/5 3/5 *  Shoulder IR/ER 5/5 3/5 *  Elbow 5/5 4/5 *  Wrist/hand 5/5 5/5 *                                      * painful SENSATION: WNL BUE  SPECIAL TESTS: + hawkins kennedy, + empty can, + Neers    FUNCTIONAL MOBILITY: independent  Throacic accessory motions: hypomobile T 6- T10  Palpation: tender to palpation to right scapula musculature and + spring test T6- T8   OUTCOME MEASURES: TEST Outcome Interpretation      Quick dash  Next visit Next visit                           Treatment:   manula therapy long axis distraction / glide with right shoulder abd 0 deg to 160 deg  30 , 60 90 120 , 150 deg 30 bouts grade 2 and 3.   no pain following treatment Patient is able to perform AROM supine right shoulder abd following manual therapy without pain.           PT Education - 03/21/17 1534    Education provided  Yes    Education Details  plan of care    Person(s) Educated  Patient    Methods  Explanation    Comprehension  Verbalized understanding       PT Short Term Goals - 03/21/17 1647      PT SHORT TERM GOAL #1   Title  Patient will report a worst pain of 5/10 on VAS in  right shoulder  to improve tolerance with ADLs and reduced symptoms with activities    Time  4    Period  Weeks    Status  New    Target Date  04/18/17        PT Long Term Goals - 03/21/17 1641      PT LONG TERM GOAL #1   Title  Patient will increase BLE gross strength to 4+/5 as to improve functional strength for independent gait, increased standing tolerance and increased ADL ability.    Time  8    Period  Weeks    Status  New    Target Date  05/16/17      PT LONG TERM GOAL #2   Title  Patient will report a worst pain of 3/10 on VAS in right shoulder  to improve tolerance with ADLs and reduced symptoms with  activities.     Time  8    Period  Weeks    Status  New    Target Date  05/16/17      PT LONG TERM GOAL #3   Title  Patient will improve shoulder AROM to > 140 degrees of flexion, scaption, and abduction for improved ability to perform overhead activities.    Time  8    Period  Weeks    Status  New    Target Date  05/16/17      PT LONG TERM GOAL #4   Title   Patient will decrease Quick DASH score by > 8 points demonstrating reduced self-reported upper extremity disability.    Time  8    Period  Weeks    Status  New    Target Date  05/16/17             Plan - 03/21/17 1651    Clinical Impression Statement   Patient has dx of right shoulder pain and presents with decreased strength and painful R shoulder flex/abd/ER/IR, decreased right shoulder AROM,  pain ful but full PROM R shoulder. He is tender to palpation right scapula musculature, has + spring test T 6- T10 and hypomobility to T 6- T8 . He repsonds to [facility] therapy  with less pain following treatment. He was instructed in better sleeping position to open right shouler AC joint space to reduce night time pain. He will benefit from skilled PT to improve ROM and pain to right shoulder and return to PLOF.      Clinical Presentation  Stable    Clinical Decision Making  Low    Rehab Potential  Good    PT Frequency  2x / week    PT Duration  8 weeks    PT Treatment/Interventions  Manual techniques;Dry needling;Passive range of motion;Patient/family education;Therapeutic exercise;Moist Heat;Electrical Stimulation;Cryotherapy;Ultrasound;Taping    PT Next Visit Plan  manual therapy thoracic spine, manual therapy right shoulder, HEP    Consulted and Agree with Plan of Care  Patient;Family member/caregiver       Patient will benefit from skilled therapeutic intervention in order to improve the following deficits and impairments:  Decreased strength, Decreased range of motion, Impaired flexibility, Pain, Hypomobility  Visit Diagnosis: Right shoulder pain, unspecified chronicity  Stiffness of right shoulder, not elsewhere classified     Problem List Patient Active Problem List   Diagnosis Date Noted  . Right shoulder pain 03/10/2017  . Hypertension, essential 12/12/2016  . Eye problem 09/20/2016  . Vitiligo 08/27/2016  . Welcome to Medicare preventive visit 08/27/2016  . Nodule of buttock 02/16/2016  . Dyslipidemia 02/16/2016  . Advanced care planning/counseling discussion 02/16/2016  . Fatigue 05/06/2015  . Benign prostatic hyperplasia   . Routine general medical examination at a health care facility 08/28/2010  . GERD 09/02/2007     ,Alanson Puls, PT, DPT 03/21/2017, 5:09 PM  [place] MAIN [facility] SERVICES [address] [place], [place], [address] Phone: [phone]   Fax:  [phone]  Name: LANGSTON TUBERVILLE MRN: [id number] Date of Birth: 15-Aug-1945

## 2017-03-26 ENCOUNTER — Ambulatory Visit: Payer: PPO | Attending: Family Medicine | Admitting: Physical Therapy

## 2017-03-26 ENCOUNTER — Encounter: Payer: Self-pay | Admitting: Physical Therapy

## 2017-03-26 DIAGNOSIS — M25611 Stiffness of right shoulder, not elsewhere classified: Secondary | ICD-10-CM | POA: Insufficient documentation

## 2017-03-26 DIAGNOSIS — M25511 Pain in right shoulder: Secondary | ICD-10-CM | POA: Insufficient documentation

## 2017-03-26 NOTE — Patient Instructions (Signed)
Shoulder Press: Supine with Protraction    Tubing around back, anchored in extended arms, push fists further toward ceiling from shoulder blades. Repeat 10__ times per set. Do _2_ sets per session. Do _7_ sessions per week.  http://tub.exer.us/291   Copyright  VHI. All rights reserved.  Protraction (Passive)    Clasp hands together. Keeping elbows straight, slide arms forward, assisting with opposite hand, without moving trunk. Use table surface for support. Hold __3__ seconds. Repeat __10__ times. Do __2__ sessions per day.  Copyright  VHI. All rights reserved.  STANDING: Scapular Depression Over Ball    Push forearms down into ball. Hold _3__ seconds. __10_ reps per set, _2__ sets per day, _7__ days per week  Copyright  VHI. All rights reserved.  External Rotator Cuff Stretch, Supine    Lie supine, fingers clasped behind head, elbows close together. Pull elbows backward while pinching shoulder blades. Hold __3_ seconds. Repeat __10_ times per session. Do __2_ sessions per day.  Copyright  VHI. All rights reserved.

## 2017-03-26 NOTE — Therapy (Signed)
[place] Ch[place] MAIN [facility] SERVICES [address] [place], [place], [address] Phone: [phone]   Fax:  [phone]  Physical Therapy Treatment  Patient Details  Name: Jonathon Chavez MRN: [id number] Date of Birth: Oct 09, 1945 Referring Provider: Tonia Ghent   Encounter Date: 03/26/2017  PT End of Session - 03/26/17 1409    Visit Number  2    Number of Visits  17    Date for PT Re-Evaluation  05/16/17    PT Start Time  0200    PT Stop Time  0245    PT Time Calculation (min)  45 min    Activity Tolerance  Patient limited by pain    Behavior During Therapy  [facility] for tasks assessed/performed       Past Medical History:  Diagnosis Date  . BPH (benign prostatic hypertrophy)   . GERD (gastroesophageal reflux disease)   . Hemorrhoids   . Hypertension     Past Surgical History:  Procedure Laterality Date  . CATARACT EXTRACTION Bilateral 2018   Dr Clent Jacks  . CHOLECYSTECTOMY  8/14   Dr Pat Patrick  . COLONOSCOPY  03/2013   sessile serrated polyp, diverticulosis, rpt 5 yrs (Pyrtle)    There were no vitals filed for this visit.  Subjective Assessment - 03/26/17 1407    Subjective  patinet reports that his shoulder is worse and ranging from 4/10-8/10 right side and continues to hurt at night.     Pertinent History  Patient began having pain in right shoulder 2 months ago and was using a total gym at home and began having right shoulder pain.     Diagnostic tests  x ray    Patient Stated Goals  Patient wants to get rid of his right shoulder pain.     Currently in Pain?  Yes    Pain Score  8     Pain Location  Shoulder    Pain Orientation  Right    Pain Descriptors / Indicators  Sharp    Pain Type  Chronic pain    Pain Onset  More than a month ago    Pain Frequency  Intermittent    Aggravating Factors   movement    Pain Relieving Factors  rest    Effect of Pain on Daily Activities  unable to do as much wiht right hand        Treatment:  Manual therapy;  PA mobs to T4- T 8 grade 3 and 4 x 30 bouts x 3 STM to right scapula musculature  R shoulder inferior glide 30 degs, 60 deg, 90 deg  Therapeutic exercise: B UE shoulder protraction with 3 sec hold in supine x 20  Hands behind head and scapula retraction with 3 sec hold x 20   Standing with theraball and forearms on theraball and 20 depressions with 3 sec hold x 20  Seated with hands on table and protraction with 3 sec hold x 20   Patient continues to have pain with AROM  Patient has no pain during PROM abd with distraction. In supine. Quick dash is 70.45                          PT Education - 03/26/17 1509    Education provided  Yes    Education Details  scapula isometerics including retraction, protraction and depression    Person(s) Educated  Patient    Methods  Explanation  Comprehension  Verbalized understanding       PT Short Term Goals - 03/21/17 1647      PT SHORT TERM GOAL #1   Title  Patient will report a worst pain of 5/10 on VAS in  right shoulder  to improve tolerance with ADLs and reduced symptoms with activities    Time  4    Period  Weeks    Status  New    Target Date  04/18/17        PT Long Term Goals - 03/21/17 1641      PT LONG TERM GOAL #1   Title  Patient will increase BLE gross strength to 4+/5 as to improve functional strength for independent gait, increased standing tolerance and increased ADL ability.    Time  8    Period  Weeks    Status  New    Target Date  05/16/17      PT LONG TERM GOAL #2   Title  Patient will report a worst pain of 3/10 on VAS in right shoulder  to improve tolerance with ADLs and reduced symptoms with activities.     Time  8    Period  Weeks    Status  New    Target Date  05/16/17      PT LONG TERM GOAL #3   Title  Patient will improve shoulder AROM to > 140 degrees of flexion, scaption, and abduction for improved ability to perform overhead  activities.    Time  8    Period  Weeks    Status  New    Target Date  05/16/17      PT LONG TERM GOAL #4   Title   Patient will decrease Quick DASH score by > 8 points demonstrating reduced self-reported upper extremity disability.    Time  8    Period  Weeks    Status  New    Target Date  05/16/17            Plan - 03/26/17 1514    Clinical Impression Statement  Patient continues to have 7/10 pain in right shoulder and  with most movements and at night. He has painful arc with pain 0-90 deg and 160-170 deg right shoulder. He tolerates manula therapy for PA glides grade 3 to T1- T 12 and stm to right scapula musculature. He performs isometric exericses for scapula and AROM with rod in painfree range in supine. He continues to have 7/10 following treatment. He will continue to benefit from skilled PT to improve strength and decrease pain.     Rehab Potential  Good    PT Frequency  2x / week    PT Duration  8 weeks    PT Treatment/Interventions  Manual techniques;Dry needling;Passive range of motion;Patient/family education;Therapeutic exercise;Moist Heat;Electrical Stimulation;Cryotherapy;Ultrasound;Taping    PT Next Visit Plan  manual therapy thoracic spine, manual therapy right shoulder, HEP    Consulted and Agree with Plan of Care  Patient;Family member/caregiver       Patient will benefit from skilled therapeutic intervention in order to improve the following deficits and impairments:  Decreased strength, Decreased range of motion, Impaired flexibility, Pain, Hypomobility  Visit Diagnosis: Right shoulder pain, unspecified chronicity  Stiffness of right shoulder, not elsewhere classified     Problem List Patient Active Problem List   Diagnosis Date Noted  . Right shoulder pain 03/10/2017  . Hypertension, essential 12/12/2016  . Eye problem 09/20/2016  . Vitiligo 08/27/2016  .  Welcome to Medicare preventive visit 08/27/2016  . Nodule of buttock 02/16/2016  .  Dyslipidemia 02/16/2016  . Advanced care planning/counseling discussion 02/16/2016  . Fatigue 05/06/2015  . Benign prostatic hyperplasia   . Routine general medical examination at a health care facility 08/28/2010  . GERD 09/02/2007    Alanson Puls, PT DPT 03/26/2017, 3:56 PM  [place] MAIN [facility] SERVICES [address] [place], [place], [address] Phone: [phone]   Fax:  [phone]  Name: Jonathon Chavez MRN: [id number] Date of Birth: 15-Dec-1945

## 2017-03-28 ENCOUNTER — Ambulatory Visit: Payer: PPO | Admitting: Physical Therapy

## 2017-03-28 ENCOUNTER — Encounter: Payer: Self-pay | Admitting: Physical Therapy

## 2017-03-28 DIAGNOSIS — M25511 Pain in right shoulder: Secondary | ICD-10-CM | POA: Diagnosis not present

## 2017-03-28 DIAGNOSIS — M25611 Stiffness of right shoulder, not elsewhere classified: Secondary | ICD-10-CM

## 2017-03-28 NOTE — Therapy (Signed)
[place] MAIN [facility] SERVICES [address] [place], [place], [address] Phone: [phone]   Fax:  [phone]  Physical Therapy Treatment  Patient Details  Name: Jonathon Chavez MRN: [id number] Date of Birth: Oct 04, 1945 Referring Provider: Tonia Ghent   Encounter Date: 03/28/2017  PT End of Session - 03/28/17 1014    Visit Number  3    Number of Visits  17    Date for PT Re-Evaluation  05/16/17    PT Start Time  0915    PT Stop Time  1000    PT Time Calculation (min)  45 min    Activity Tolerance  Patient limited by pain    Behavior During Therapy  [facility] for tasks assessed/performed       Past Medical History:  Diagnosis Date  . BPH (benign prostatic hypertrophy)   . GERD (gastroesophageal reflux disease)   . Hemorrhoids   . Hypertension     Past Surgical History:  Procedure Laterality Date  . CATARACT EXTRACTION Bilateral 2018   Dr Clent Jacks  . CHOLECYSTECTOMY  8/14   Dr Pat Patrick  . COLONOSCOPY  03/2013   sessile serrated polyp, diverticulosis, rpt 5 yrs (Pyrtle)    There were no vitals filed for this visit.  Subjective Assessment - 03/28/17 1012    Subjective  patinet reports that his shoulder was  beter after the last PT treatmetn and today his pain is 5/10 and  continues to hurt at night.     Patient is accompained by:  Family member    Pertinent History  Patient began having pain in right shoulder 2 months ago and was using a total gym at home and began having right shoulder pain.     Diagnostic tests  x ray    Patient Stated Goals  Patient wants to get rid of his right shoulder pain.     Currently in Pain?  Yes    Pain Score  5     Pain Location  Shoulder    Pain Orientation  Right    Pain Descriptors / Indicators  Sharp    Pain Type  Chronic pain    Pain Onset  More than a month ago    Pain Frequency  Intermittent    Aggravating Factors   movement    Pain Relieving Factors  rest    Effect of Pain on Daily  Activities  unable to do as much    Multiple Pain Sites  No       Treatment: Manual therapy;  STM to right scapula musculature to reduce spasms and tenderness with tennis ball in prone  PA glides grade 4 , 30 bouts x 3 T2- T10  Right Scapula mobilization : lateral/medial/ anterior/posterior x 30 with PROM to right shoulder in sidelying  Reviewed HEP ; scapula retraction, scapula depression, scapula protraction with 5 sec hold  Patient has no pain with PROM to right shoulder following treatment, continues to have pain with AROM right shoulder                     PT Education - 03/28/17 1014    Education provided  Yes    Education Details  reviewed HEP    Person(s) Educated  Patient    Methods  Explanation    Comprehension  Verbalized understanding       PT Short Term Goals - 03/21/17 1647      PT SHORT TERM GOAL #1  Title  Patient will report a worst pain of 5/10 on VAS in  right shoulder  to improve tolerance with ADLs and reduced symptoms with activities    Time  4    Period  Weeks    Status  New    Target Date  04/18/17        PT Long Term Goals - 03/21/17 1641      PT LONG TERM GOAL #1   Title  Patient will increase BLE gross strength to 4+/5 as to improve functional strength for independent gait, increased standing tolerance and increased ADL ability.    Time  8    Period  Weeks    Status  New    Target Date  05/16/17      PT LONG TERM GOAL #2   Title  Patient will report a worst pain of 3/10 on VAS in right shoulder  to improve tolerance with ADLs and reduced symptoms with activities.     Time  8    Period  Weeks    Status  New    Target Date  05/16/17      PT LONG TERM GOAL #3   Title  Patient will improve shoulder AROM to > 140 degrees of flexion, scaption, and abduction for improved ability to perform overhead activities.    Time  8    Period  Weeks    Status  New    Target Date  05/16/17      PT LONG TERM GOAL #4   Title    Patient will decrease Quick DASH score by > 8 points demonstrating reduced self-reported upper extremity disability.    Time  8    Period  Weeks    Status  New    Target Date  05/16/17            Plan - 03/28/17 1015    Clinical Impression Statement  Patient has 5/10 pain  to right shoulder with tenderness to palpation right scapula musculature and T 2- T 10 spring test is positive. He has pain with right shoulder flex beginning at 20 degs and pain with right shoulder  abd  beginning at 20 degs. Prior to manual therapy he has pain  with PROM and AROM. Following treatment he does not have pain with passive movements to right shoulder.  He resonds to [employer] therapy to rigth scapula musculature for STM . His HEP was reviewed but was not progressed. He will conitnue to benefit from skilled PT to improve symptoms of pain and decreased movement.     Rehab Potential  Good    PT Frequency  2x / week    PT Duration  8 weeks    PT Treatment/Interventions  Manual techniques;Dry needling;Passive range of motion;Patient/family education;Therapeutic exercise;Moist Heat;Electrical Stimulation;Cryotherapy;Ultrasound;Taping    PT Next Visit Plan  manual therapy thoracic spine, manual therapy right shoulder, HEP    Consulted and Agree with Plan of Care  Patient;Family member/caregiver       Patient will benefit from skilled therapeutic intervention in order to improve the following deficits and impairments:  Decreased strength, Decreased range of motion, Impaired flexibility, Pain, Hypomobility  Visit Diagnosis: Right shoulder pain, unspecified chronicity  Stiffness of right shoulder, not elsewhere classified     Problem List Patient Active Problem List   Diagnosis Date Noted  . Right shoulder pain 03/10/2017  . Hypertension, essential 12/12/2016  . Eye problem 09/20/2016  . Vitiligo 08/27/2016  . Welcome to [employer]  preventive visit 08/27/2016  . Nodule of buttock 02/16/2016  .  Dyslipidemia 02/16/2016  . Advanced care planning/counseling discussion 02/16/2016  . Fatigue 05/06/2015  . Benign prostatic hyperplasia   . Routine general medical examination at a health care facility 08/28/2010  . GERD 09/02/2007    Alanson Puls, PT DPT 03/28/2017, 10:20 AM  [place] MAIN [facility] SERVICES [address] [place], [place], [address] Phone: [phone]   Fax:  [phone]  Name: Jonathon Chavez MRN: [id number] Date of Birth: October 22, 1945

## 2017-03-29 ENCOUNTER — Telehealth: Payer: Self-pay

## 2017-03-29 MED ORDER — LOSARTAN POTASSIUM 50 MG PO TABS: 50.0000 mg | ORAL_TABLET | Freq: Every day | ORAL | 6 refills | Status: DC

## 2017-03-29 NOTE — Telephone Encounter (Signed)
Let's start losartan 50mg  daily - sent to pharmacy. Have him check with pharmacy that losartan dose is no longer affected by last year's recall

## 2017-03-29 NOTE — Telephone Encounter (Signed)
Spoke with pt relaying message per Dr. G. Pt verbalizes understanding. 

## 2017-03-29 NOTE — Telephone Encounter (Signed)
I spoke with pt and med is lisinopril causing cough for at least one week. Pt request different med to [employer]. Last seen for acute issue 03/15/17.Please advise.

## 2017-03-29 NOTE — Telephone Encounter (Signed)
Copied from [place]. Topic: General - Other >> Mar 29, 2017  8:54 AM Carolyn Stare wrote:  Pt wife call to say the following med is causing pt to have a cough and is asking if something else can be [place]

## 2017-04-02 ENCOUNTER — Encounter: Payer: PPO | Admitting: Physical Therapy

## 2017-04-04 ENCOUNTER — Encounter: Payer: PPO | Admitting: Physical Therapy

## 2017-04-08 ENCOUNTER — Ambulatory Visit: Payer: PPO

## 2017-04-22 ENCOUNTER — Ambulatory Visit: Payer: PPO

## 2017-04-29 ENCOUNTER — Ambulatory Visit: Payer: PPO

## 2017-06-24 ENCOUNTER — Telehealth: Payer: Self-pay | Admitting: Family Medicine

## 2017-06-24 NOTE — Telephone Encounter (Signed)
Copied from [place] [id number]. Topic: Quick Communication - See Telephone Encounter >> Jun 24, 2017  1:13 PM Arletha Grippe wrote: CRM for notification. See Telephone encounter for: 06/24/17. Pt needs to know what date his blood pressure medication was changed. This is for insurance purposes.  Please call [phone], pt gives permission for you to talk to wife, forrest Jankowski.

## 2017-06-24 NOTE — Telephone Encounter (Signed)
Spoke to pts wife and advised BP med was changed on 2-8

## 2017-08-01 DIAGNOSIS — H33102 Unspecified retinoschisis, left eye: Secondary | ICD-10-CM | POA: Diagnosis not present

## 2017-08-01 DIAGNOSIS — H348322 Tributary (branch) retinal vein occlusion, left eye, stable: Secondary | ICD-10-CM | POA: Diagnosis not present

## 2017-08-01 DIAGNOSIS — H35042 Retinal micro-aneurysms, unspecified, left eye: Secondary | ICD-10-CM | POA: Diagnosis not present

## 2017-08-01 DIAGNOSIS — H3562 Retinal hemorrhage, left eye: Secondary | ICD-10-CM | POA: Diagnosis not present

## 2017-08-26 ENCOUNTER — Other Ambulatory Visit: Payer: Self-pay | Admitting: Family Medicine

## 2017-08-26 DIAGNOSIS — I10 Essential (primary) hypertension: Secondary | ICD-10-CM

## 2017-08-26 DIAGNOSIS — E785 Hyperlipidemia, unspecified: Secondary | ICD-10-CM

## 2017-08-26 DIAGNOSIS — R350 Frequency of micturition: Secondary | ICD-10-CM

## 2017-08-26 DIAGNOSIS — Z1159 Encounter for screening for other viral diseases: Secondary | ICD-10-CM

## 2017-08-26 DIAGNOSIS — N401 Enlarged prostate with lower urinary tract symptoms: Secondary | ICD-10-CM

## 2017-08-26 NOTE — Addendum Note (Signed)
Addended by: Ria Bush on: 08/26/2017 07:29 AM   Modules accepted: Orders

## 2017-08-28 ENCOUNTER — Ambulatory Visit: Payer: PPO

## 2017-08-29 ENCOUNTER — Ambulatory Visit (INDEPENDENT_AMBULATORY_CARE_PROVIDER_SITE_OTHER): Payer: PPO

## 2017-08-29 VITALS — BP 122/90 | HR 82 | Temp 97.8°F | Ht 72.0 in | Wt 199.5 lb

## 2017-08-29 DIAGNOSIS — Z1159 Encounter for screening for other viral diseases: Secondary | ICD-10-CM | POA: Diagnosis not present

## 2017-08-29 DIAGNOSIS — N401 Enlarged prostate with lower urinary tract symptoms: Secondary | ICD-10-CM | POA: Diagnosis not present

## 2017-08-29 DIAGNOSIS — Z0000 Encounter for general adult medical examination without abnormal findings: Secondary | ICD-10-CM | POA: Diagnosis not present

## 2017-08-29 DIAGNOSIS — I10 Essential (primary) hypertension: Secondary | ICD-10-CM

## 2017-08-29 DIAGNOSIS — E785 Hyperlipidemia, unspecified: Secondary | ICD-10-CM | POA: Diagnosis not present

## 2017-08-29 DIAGNOSIS — R350 Frequency of micturition: Secondary | ICD-10-CM | POA: Diagnosis not present

## 2017-08-29 NOTE — Patient Instructions (Signed)
Mr. Fails , Thank you for taking time to come for your Medicare Wellness Visit. I appreciate your ongoing commitment to your health goals. Please review the following plan we discussed and let me know if I can assist you in the future.   These are the goals we discussed: Goals    . Increase physical activity     Starting 08/29/2017, I will continue to walk 30-60 minutes daily.        This is a list of the screening recommended for you and due dates:  Health Maintenance  Topic Date Due  . Flu Shot  09/19/2017  . Colon Cancer Screening  04/08/2018  . DTaP/Tdap/Td vaccine (2 - Td) 02/07/2023  . Tetanus Vaccine  02/07/2023  .  Hepatitis C: One time screening is recommended by Center for Disease Control  (CDC) for  adults born from [birth year] through 1963.   Completed  . Pneumonia vaccines  Completed   Preventive Care for Adults  A healthy lifestyle and preventive care can promote health and wellness. Preventive health guidelines for adults include the following key practices.  . A routine yearly physical is a good way to check with your health care provider about your health and preventive screening. It is a chance to share any concerns and updates on your health and to receive a thorough exam.  . Visit your dentist for a routine exam and preventive care every 6 months. Brush your teeth twice a day and floss once a day. Good oral hygiene prevents tooth decay and gum disease.  . The frequency of eye exams is based on your age, health, family medical history, use  of contact lenses, and other factors. Follow your health care provider's recommendations for frequency of eye exams.  . Eat a healthy diet. Foods like vegetables, fruits, whole grains, low-fat dairy products, and lean protein foods contain the nutrients you need without too many calories. Decrease your intake of foods high in solid fats, added sugars, and salt. Eat the right amount of calories for you. Get information about a proper  diet from your health care provider, if necessary.  . Regular physical exercise is one of the most important things you can do for your health. Most adults should get at least 150 minutes of moderate-intensity exercise (any activity that increases your heart rate and causes you to sweat) each week. In addition, most adults need muscle-strengthening exercises on 2 or more days a week.  Silver Sneakers may be a benefit available to you. To determine eligibility, you may visit the website: www.silversneakers.com or contact program at 6817159961 Mon-Fri between 8AM-8PM.   . Maintain a healthy weight. The body mass index (BMI) is a screening tool to identify possible weight problems. It provides an estimate of body fat based on height and weight. Your health care provider can find your BMI and can help you achieve or maintain a healthy weight.   For adults 20 years and older: ? A BMI below 18.5 is considered underweight. ? A BMI of 18.5 to 24.9 is normal. ? A BMI of 25 to 29.9 is considered overweight. ? A BMI of 30 and above is considered obese.   . Maintain normal blood lipids and cholesterol levels by exercising and minimizing your intake of saturated fat. Eat a balanced diet with plenty of fruit and vegetables. Blood tests for lipids and cholesterol should begin at age 62 and be repeated every 5 years. If your lipid or cholesterol levels are high, you are  over 65, or you are at high risk for heart disease, you may need your cholesterol levels checked more frequently. Ongoing high lipid and cholesterol levels should be treated with medicines if diet and exercise are not working.  . If you smoke, find out from your health care provider how to quit. If you do not use tobacco, please do not start.  . If you choose to drink alcohol, please do not consume more than 2 drinks per day. One drink is considered to be 12 ounces (355 mL) of beer, 5 ounces (148 mL) of wine, or 1.5 ounces (44 mL) of  liquor.  . If you are 53-74 years old, ask your health care provider if you should take aspirin to prevent strokes.  . Use sunscreen. Apply sunscreen liberally and repeatedly throughout the day. You should seek shade when your shadow is shorter than you. Protect yourself by wearing long sleeves, pants, a wide-brimmed hat, and sunglasses year round, whenever you are outdoors.  . Once a month, do a whole body skin exam, using a mirror to look at the skin on your back. Tell your health care provider of new moles, moles that have irregular borders, moles that are larger than a pencil eraser, or moles that have changed in shape or color.

## 2017-08-29 NOTE — Progress Notes (Signed)
Subjective:   Jonathon Chavez is a 72 y.o. male who presents for an Initial Medicare Annual Wellness Visit.  Review of Systems  N/A Cardiac Risk Factors include: male gender;hypertension;advanced age (>74men, >67 women)    Objective:    Today's Vitals   08/29/17 1214  BP: 122/90  Pulse: 82  Temp: 97.8 F (36.6 C)  TempSrc: Oral  SpO2: 96%  Weight: 199 lb 8 oz (90.5 kg)  Height: 6' (1.829 m)  PainSc: 0-No pain   Body mass index is 27.06 kg/m.  Advanced Directives 08/29/2017 03/21/2017 02/08/2014  Does Patient Have a Medical Advance Directive? No Yes No  Type of Advance Directive - [place];Living will -  Would patient like information on creating a medical advance directive? No - Patient declined - Yes - Educational materials given    Current Medications (verified) Outpatient Encounter Medications as of 08/29/2017  Medication Sig  . aspirin EC 81 MG tablet Take 81 mg by mouth daily.  . Cholecalciferol (VITAMIN D3 PO) Take by mouth.  . Cyanocobalamin (VITAMIN B12 PO) Take by mouth.  . losartan (COZAAR) 50 MG tablet Take 1 tablet (50 mg total) by mouth daily.  Marland Kitchen MAGNESIUM OXIDE PO Take by mouth.  . meloxicam (MOBIC) 15 MG tablet Take 1 tablet (15 mg total) by mouth daily. With food.  . Multiple Vitamins-Minerals (EMERGEN-C IMMUNE PLUS PO) Take by mouth daily as needed.   No facility-administered encounter medications on file as of 08/29/2017.     Allergies (verified) Lisinopril   History: Past Medical History:  Diagnosis Date  . BPH (benign prostatic hypertrophy)   . GERD (gastroesophageal reflux disease)   . Hemorrhoids   . Hypertension    Past Surgical History:  Procedure Laterality Date  . CATARACT EXTRACTION Bilateral 2018   Dr Clent Jacks  . CHOLECYSTECTOMY  8/14   Dr Pat Patrick  . COLONOSCOPY  03/2013   sessile serrated polyp, diverticulosis, rpt 5 yrs (Pyrtle)   Family History  Problem Relation Age of Onset  . Diabetes Maternal  Grandmother   . Dementia Father        alz  . CAD Father 34       5v CABG  . AAA (abdominal aortic aneurysm) Father   . CVA Father 64  . Heart disease Father   . Cancer Mother        ?ovarian cancer  . Cancer Brother        throat cancer  . Cancer Brother 9       prostate cancer   Social History   Socioeconomic History  . Marital status: Married    Spouse name: Not on file  . Number of children: 1  . Years of education: Not on file  . Highest education level: Not on file  Occupational History  . Occupation: truck Education administrator: [employer]  . Financial resource strain: Not on file  . Food insecurity:    Worry: Not on file    Inability: Not on file  . Transportation needs:    Medical: Not on file    Non-medical: Not on file  Tobacco Use  . Smoking status: Former Smoker    Last attempt to quit: 03/25/1973    Years since quitting: 44.4  . Smokeless tobacco: Never Used  Substance and Sexual Activity  . Alcohol use: No  . Drug use: No  . Sexual activity: Not on file  Lifestyle  . Physical activity:  Days per week: Not on file    Minutes per session: Not on file  . Stress: Not on file  Relationships  . Social connections:    Talks on phone: Not on file    Gets together: Not on file    Attends religious service: Not on file    Active member of club or organization: Not on file    Attends meetings of clubs or organizations: Not on file    Relationship status: Not on file  Other Topics Concern  . Not on file  Social History Narrative   Lives with wife.    Occupation: truck Geophysicist/field seismologist for [employer]   Activity: walks dog, physical work   Diet: good water, fruits/vegetables daily   Tobacco Counseling Counseling given: No   Clinical Intake:  Pre-visit preparation completed: Yes  Pain : No/denies pain Pain Score: 0-No pain     Nutritional Status: BMI 25 -29 Overweight Nutritional Risks: None Diabetes: No  How often do you need  to have someone help you when you read instructions, pamphlets, or other written materials from your doctor or pharmacy?: 1 - Never What is the last grade level you completed in school?: 12th grade + 2 yrs college  Interpreter Needed?: No  Comments: pt lives with spouse Information entered by :: LPinson, LPN  Activities of Daily Living In your present state of health, do you have any difficulty performing the following activities: 08/29/2017  Hearing? N  Vision? N  Difficulty concentrating or making decisions? N  Walking or climbing stairs? N  Dressing or bathing? N  Doing errands, shopping? N  Preparing Food and eating ? N  Using the Toilet? N  In the past six months, have you accidently leaked urine? N  Do you have problems with loss of bowel control? N  Managing your Medications? N  Managing your Finances? N  Housekeeping or managing your Housekeeping? N  Some recent data might be hidden     Immunizations and Health Maintenance Immunization History  Administered Date(s) Administered  . Influenza Split 12/24/2012  . Influenza-Unspecified 11/30/2013, 11/08/2014, 12/05/2015, 11/22/2016  . Pneumococcal Conjugate-13 02/08/2014  . Pneumococcal Polysaccharide-23 02/06/2013  . Td 02/20/2003  . Tdap 02/06/2013  . Zoster 08/28/2010   There are no preventive care reminders to display for this patient.  Patient Care Team: Ria Bush, MD as PCP - General (Family Medicine)  Indicate any recent Medical Services you may have received from other than [facility] providers in the past year (date may be approximate).    Assessment:   This is a routine wellness examination for [place].  Hearing/Vision screen  Hearing Screening   125Hz  250Hz  500Hz  1000Hz  2000Hz  3000Hz  4000Hz  6000Hz  8000Hz   Right ear:   40 40 40  40    Left ear:   40 40 40  40    Vision Screening Comments: Vision exams every 3-4 months with retina specialist  Dietary issues and exercise activities  discussed: Current Exercise Habits: Home exercise routine, Type of exercise: walking, Time (Minutes): 45, Frequency (Times/Week): 7, Weekly Exercise (Minutes/Week): 315, Intensity: Mild, Exercise limited by: None identified  Goals    . Increase physical activity     Starting 08/29/2017, I will continue to walk 30-60 minutes daily.       Depression Screen PHQ 2/9 Scores 08/29/2017 08/27/2016 02/08/2014 02/06/2013  PHQ - 2 Score 0 0 0 0  PHQ- 9 Score 0 - - -    Fall Risk Fall Risk  08/29/2017 08/27/2016  02/08/2014 02/06/2013  Falls in the past year? No No No No    Cognitive Function: MMSE - Mini Mental State Exam 08/29/2017  Orientation to time 5  Orientation to Place 5  Registration 3  Attention/ Calculation 0  Recall 3  Language- name 2 objects 0  Language- repeat 1  Language- follow 3 step command 3  Language- read & follow direction 0  Write a sentence 0  Copy design 0  Total score 20     PLEASE NOTE: A Mini-Cog screen was completed. Maximum score is 20. A value of 0 denotes this part of Folstein MMSE was not completed or the patient failed this part of the Mini-Cog screening.   Mini-Cog Screening Orientation to Time - Max 5 pts Orientation to Place - Max 5 pts Registration - Max 3 pts Recall - Max 3 pts Language Repeat - Max 1 pts Language Follow 3 Step Command - Max 3 pts     Screening Tests Health Maintenance  Topic Date Due  . INFLUENZA VACCINE  09/19/2017  . COLONOSCOPY  04/08/2018  . DTaP/Tdap/Td (2 - Td) 02/07/2023  . TETANUS/TDAP  02/07/2023  . Hepatitis C Screening  Completed  . PNA vac Low Risk Adult  Completed     Plan:     I have personally reviewed, addressed, and noted the following in the patient's chart:  A. Medical and social history B. Use of alcohol, tobacco or illicit drugs  C. Current medications and supplements D. Functional ability and status E.  Nutritional status F.  Physical activity G. Advance directives H. List of other  physicians I.  Hospitalizations, surgeries, and ER visits in previous 12 months J.  [place] to include hearing, vision, cognitive, depression L. Referrals and appointments - none  In addition, I have reviewed and discussed with patient certain preventive protocols, quality metrics, and best practice recommendations. A written personalized care plan for preventive services as well as general preventive health recommendations were provided to patient.  See attached scanned questionnaire for additional information.   Signed,   Lindell Noe, MHA, BS, LPN Health Coach'

## 2017-08-29 NOTE — Progress Notes (Signed)
PCP notes:   Health maintenance:  Hep C screening - will be completed with labs  Abnormal screenings:   None  Patient concerns:   None  Nurse concerns:  None  Next PCP appt:   09/03/17 @ 1500  Routed to PCP as FYI.  I reviewed health advisor's note, was available for consultation on the day of service listed in this note, and agree with documentation and plan. Elsie Stain, MD.

## 2017-08-30 LAB — COMPREHENSIVE METABOLIC PANEL
ALBUMIN: 4 g/dL (ref 3.5–5.2)
ALK PHOS: 34 U/L — AB (ref 39–117)
ALT: 31 U/L (ref 0–53)
AST: 26 U/L (ref 0–37)
BUN: 17 mg/dL (ref 6–23)
CHLORIDE: 104 meq/L (ref 96–112)
CO2: 28 mEq/L (ref 19–32)
Calcium: 9 mg/dL (ref 8.4–10.5)
Creatinine, Ser: 0.93 mg/dL (ref 0.40–1.50)
GFR: 85.28 mL/min (ref >60.00)
GLUCOSE: 98 mg/dL (ref 70–99)
Potassium: 4.1 mEq/L (ref 3.5–5.1)
Sodium: 139 mEq/L (ref 135–145)
TOTAL PROTEIN: 6.9 g/dL (ref 6.0–8.3)
Total Bilirubin: 0.8 mg/dL (ref 0.2–1.2)

## 2017-08-30 LAB — LIPID PANEL
CHOLESTEROL: 162 mg/dL (ref 0–200)
HDL: 33.5 mg/dL — AB (ref >39.00)
LDL CALC: 100 mg/dL — AB (ref 0–99)
NonHDL: 128.51
Total CHOL/HDL Ratio: 5
Triglycerides: 145 mg/dL (ref 0.0–149.0)
VLDL: 29 mg/dL (ref 0.0–40.0)

## 2017-08-30 LAB — PSA: PSA: 1.81 ng/mL (ref 0.10–4.00)

## 2017-08-30 LAB — MICROALBUMIN / CREATININE URINE RATIO
CREATININE, U: 86.3 mg/dL
Microalb Creat Ratio: 0.8 mg/g (ref 0.0–30.0)

## 2017-08-30 LAB — HEPATITIS C ANTIBODY
HEP C AB: NONREACTIVE
SIGNAL TO CUT-OFF: 0.02 (ref <1.00)

## 2017-09-03 ENCOUNTER — Ambulatory Visit (INDEPENDENT_AMBULATORY_CARE_PROVIDER_SITE_OTHER): Payer: PPO | Admitting: Family Medicine

## 2017-09-03 ENCOUNTER — Encounter: Payer: Self-pay | Admitting: Family Medicine

## 2017-09-03 VITALS — BP 116/68 | HR 87 | Temp 97.8°F | Ht 72.0 in | Wt 194.2 lb

## 2017-09-03 DIAGNOSIS — R350 Frequency of micturition: Secondary | ICD-10-CM | POA: Diagnosis not present

## 2017-09-03 DIAGNOSIS — H579 Unspecified disorder of eye and adnexa: Secondary | ICD-10-CM | POA: Diagnosis not present

## 2017-09-03 DIAGNOSIS — L80 Vitiligo: Secondary | ICD-10-CM | POA: Diagnosis not present

## 2017-09-03 DIAGNOSIS — Z0000 Encounter for general adult medical examination without abnormal findings: Secondary | ICD-10-CM

## 2017-09-03 DIAGNOSIS — Z7189 Other specified counseling: Secondary | ICD-10-CM | POA: Diagnosis not present

## 2017-09-03 DIAGNOSIS — I10 Essential (primary) hypertension: Secondary | ICD-10-CM | POA: Diagnosis not present

## 2017-09-03 DIAGNOSIS — E785 Hyperlipidemia, unspecified: Secondary | ICD-10-CM

## 2017-09-03 DIAGNOSIS — N401 Enlarged prostate with lower urinary tract symptoms: Secondary | ICD-10-CM

## 2017-09-03 MED ORDER — LOSARTAN POTASSIUM 50 MG PO TABS: 50.0000 mg | ORAL_TABLET | Freq: Every day | ORAL | 3 refills | Status: DC

## 2017-09-03 MED ORDER — ASPIRIN EC 81 MG PO TBEC: 81.0000 mg | DELAYED_RELEASE_TABLET | ORAL | Status: DC

## 2017-09-03 NOTE — Assessment & Plan Note (Signed)
Preventative protocols reviewed and updated unless pt declined. Discussed healthy diet and lifestyle.  

## 2017-09-03 NOTE — Assessment & Plan Note (Addendum)
Chronic, improved. Not on statin. The 10-year ASCVD risk score Mikey Bussing DC Brooke Bonito., et al., 2013) is: 19.1%   Values used to calculate the score:     Age: 72 years     Sex: Male     Is Non-Hispanic African American: No     Diabetic: No     Tobacco smoker: No     Systolic Blood Pressure: 962 mmHg     Is BP treated: Yes     HDL Cholesterol: 33.5 mg/dL     Total Cholesterol: 162 mg/dL

## 2017-09-03 NOTE — Assessment & Plan Note (Signed)
Chronic, stable. Continue losartan 50mg daily.  

## 2017-09-03 NOTE — Assessment & Plan Note (Signed)
Benign exam today. DRE/PSA reassuring.

## 2017-09-03 NOTE — Patient Instructions (Addendum)
If interested, check with pharmacy about new 2 shot shingles series (shingrix).  Check with eye doctor about baby aspirin.  Work on Scientist, physiological.  You are doing well today Return as needed or in 1 year for next physical.  Health Maintenance, Male A healthy lifestyle and preventive care is important for your health and wellness. Ask your health care provider about what schedule of regular examinations is right for you. What should I know about weight and diet? Eat a Healthy Diet  Eat plenty of vegetables, fruits, whole grains, low-fat dairy products, and lean protein.  Do not eat a lot of foods high in solid fats, added sugars, or salt.  Maintain a Healthy Weight Regular exercise can help you achieve or maintain a healthy weight. You should:  Do at least 150 minutes of exercise each week. The exercise should increase your heart rate and make you sweat (moderate-intensity exercise).  Do strength-training exercises at least twice a week.  Watch Your Levels of Cholesterol and Blood Lipids  Have your blood tested for lipids and cholesterol every 5 years starting at 70 years of age. If you are at high risk for heart disease, you should start having your blood tested when you are 70 years old. You may need to have your cholesterol levels checked more often if: ? Your lipid or cholesterol levels are high. ? You are older than 70 years of age. ? You are at high risk for heart disease.  What should I know about cancer screening? Many types of cancers can be detected early and may often be prevented. Lung Cancer  You should be screened every year for lung cancer if: ? You are a current smoker who has smoked for at least 30 years. ? You are a former smoker who has quit within the past 15 years.  Talk to your health care provider about your screening options, when you should start screening, and how often you should be screened.  Colorectal Cancer  Routine colorectal cancer  screening usually begins at 70 years of age and should be repeated every 5-10 years until you are 70 years old. You may need to be screened more often if early forms of precancerous polyps or small growths are found. Your health care provider may recommend screening at an earlier age if you have risk factors for colon cancer.  Your health care provider may recommend using home test kits to check for hidden blood in the stool.  A small camera at the end of a tube can be used to examine your colon (sigmoidoscopy or colonoscopy). This checks for the earliest forms of colorectal cancer.  Prostate and Testicular Cancer  Depending on your age and overall health, your health care provider may do certain tests to screen for prostate and testicular cancer.  Talk to your health care provider about any symptoms or concerns you have about testicular or prostate cancer.  Skin Cancer  Check your skin from head to toe regularly.  Tell your health care provider about any new moles or changes in moles, especially if: ? There is a change in a mole's size, shape, or color. ? You have a mole that is larger than a pencil eraser.  Always use sunscreen. Apply sunscreen liberally and repeat throughout the day.  Protect yourself by wearing long sleeves, pants, a wide-brimmed hat, and sunglasses when outside.  What should I know about heart disease, diabetes, and high blood pressure?  If you are 40-83 years of age, have  your blood pressure checked every 3-5 years. If you are 33 years of age or older, have your blood pressure checked every year. You should have your blood pressure measured twice-once when you are at a hospital or clinic, and once when you are not at a hospital or clinic. Record the average of the two measurements. To check your blood pressure when you are not at a hospital or clinic, you can use: ? An automated blood pressure machine at a pharmacy. ? A home blood pressure monitor.  Talk to your  health care provider about your target blood pressure.  If you are between 38-62 years old, ask your health care provider if you should take aspirin to prevent heart disease.  Have regular diabetes screenings by checking your fasting blood sugar level. ? If you are at a normal weight and have a low risk for diabetes, have this test once every three years after the age of 15. ? If you are overweight and have a high risk for diabetes, consider being tested at a younger age or more often.  A one-time screening for abdominal aortic aneurysm (AAA) by ultrasound is recommended for men aged 6-75 years who are current or former smokers. What should I know about preventing infection? Hepatitis B If you have a higher risk for hepatitis B, you should be screened for this virus. Talk with your health care provider to find out if you are at risk for hepatitis B infection. Hepatitis C Blood testing is recommended for:  Everyone born from 1 through 1963.  Anyone with known risk factors for hepatitis C.  Sexually Transmitted Diseases (STDs)  You should be screened each year for STDs including gonorrhea and chlamydia if: ? You are sexually active and are younger than 70 years of age. ? You are older than 70 years of age and your health care provider tells you that you are at risk for this type of infection. ? Your sexual activity has changed since you were last screened and you are at an increased risk for chlamydia or gonorrhea. Ask your health care provider if you are at risk.  Talk with your health care provider about whether you are at high risk of being infected with HIV. Your health care provider may recommend a prescription medicine to help prevent HIV infection.  What else can I do?  Schedule regular health, dental, and eye exams.  Stay current with your vaccines (immunizations).  Do not use any tobacco products, such as cigarettes, chewing tobacco, and e-cigarettes. If you need help  quitting, ask your health care provider.  Limit alcohol intake to no more than 2 drinks per day. One drink equals 12 ounces of beer, 5 ounces of wine, or 1 ounces of hard liquor.  Do not use street drugs.  Do not share needles.  Ask your health care provider for help if you need support or information about quitting drugs.  Tell your health care provider if you often feel depressed.  Tell your health care provider if you have ever been abused or do not feel safe at home. This information is not intended to replace advice given to you by your health care provider. Make sure you discuss any questions you have with your health care provider. Document Released: 08/04/2007 Document Revised: 10/05/2015 Document Reviewed: 11/09/2014 Elsevier Interactive Patient Education  [employer].

## 2017-09-03 NOTE — Assessment & Plan Note (Signed)
Notes progression. Has seen derm.

## 2017-09-03 NOTE — Assessment & Plan Note (Addendum)
No advanced directive in place. Wife would be health care POA. Packet provided last year -encouraged he work on this.

## 2017-09-03 NOTE — Assessment & Plan Note (Signed)
?  retinal vein occlusion - has seen retinologist. Stable period. rec check with ophtho about baby aspirin.

## 2017-09-03 NOTE — Progress Notes (Signed)
BP 116/68 (BP Location: Left Arm, Patient Position: Sitting, Cuff Size: Normal)   Pulse 87   Temp 97.8 F (36.6 C) (Oral)   Ht 6' (1.829 m)   Wt 194 lb 4 oz (88.1 kg)   SpO2 96%   BMI 26.35 kg/m    CC: CPE Subjective:    Patient ID: Jonathon Chavez, male    DOB: 1945-07-07, 72 y.o.   MRN: [id number]  HPI: Jonathon Chavez is a 72 y.o. male presenting on 09/03/2017 for Annual Exam (Pt 2. Pt accompanied by his wife.)   Annia Belt last week for medicare wellness visit. Note reviewed.   Retired 10/2016  Preventative: Colon cancer screening 03/2013 - sessile serrated polyp, diverticulosis, rpt 5 yrs (Pyrtle) Prostate cancer screening - yearly DRE/PSA  Lung cancer screening - not eligible Flu shot yearly Tdap 2014 Pneumovax 2014, prevnar 2015 zostavax - 2012 shingrix - discussed Advanced directive discussion - No living will. Wife would be health care POA. Would accept resuscitation--no prolonged ventilation. Probably no tube feeds if cognitively unaware. Packet provided last year Seat belt use discussed.  Sunscreen use and discussed. No changing moles on skin. Saw Derm.  Ex smoker (1975) Alcohol - none Dentist - Q6 mo Eye exam - yearly - sees retinologist Q43mo  Lives with wife.  Occupation: truck Geophysicist/field seismologist for [employer] Activity: increased walking - walks dog, physical work  Diet: good water, fruits/vegetables daily   Relevant past medical, surgical, family and social history reviewed and updated as indicated. Interim medical history since our last visit reviewed. Allergies and medications reviewed and updated. Outpatient Medications Prior to Visit  Medication Sig Dispense Refill  . Cholecalciferol (VITAMIN D3 PO) Take by mouth.    . Cyanocobalamin (VITAMIN B12 PO) Take by mouth.    Marland Kitchen MAGNESIUM OXIDE PO Take by mouth.    . Multiple Vitamins-Minerals (EMERGEN-C IMMUNE PLUS PO) Take by mouth daily as needed.    Marland Kitchen aspirin EC 81 MG tablet Take 81 mg by mouth daily.    Marland Kitchen  losartan (COZAAR) 50 MG tablet Take 1 tablet (50 mg total) by mouth daily. 30 tablet 6  . meloxicam (MOBIC) 15 MG tablet Take 1 tablet (15 mg total) by mouth daily. With food. 30 tablet 0   No facility-administered medications prior to visit.      Per HPI unless specifically indicated in ROS section below Review of Systems  Constitutional: Negative for activity change, appetite change, chills, fatigue, fever and unexpected weight change.  HENT: Negative for hearing loss.   Eyes: Negative for visual disturbance.  Respiratory: Negative for cough, chest tightness, shortness of breath and wheezing.   Cardiovascular: Negative for chest pain, palpitations and leg swelling.  Gastrointestinal: Negative for abdominal distention, abdominal pain, blood in stool, constipation, diarrhea, nausea and vomiting.  Genitourinary: Negative for difficulty urinating and hematuria.  Musculoskeletal: Negative for arthralgias, myalgias and neck pain.  Skin: Negative for rash.  Neurological: Negative for dizziness, seizures, syncope and headaches.  Hematological: Negative for adenopathy. Does not bruise/bleed easily.  Psychiatric/Behavioral: Negative for dysphoric mood. The patient is not nervous/anxious.        Objective:    BP 116/68 (BP Location: Left Arm, Patient Position: Sitting, Cuff Size: Normal)   Pulse 87   Temp 97.8 F (36.6 C) (Oral)   Ht 6' (1.829 m)   Wt 194 lb 4 oz (88.1 kg)   SpO2 96%   BMI 26.35 kg/m   Wt Readings from Last 3 Encounters:  09/03/17 194 lb 4 oz (88.1 kg)  08/29/17 199 lb 8 oz (90.5 kg)  03/15/17 196 lb (88.9 kg)    Physical Exam  Constitutional: He is oriented to person, place, and time. He appears well-developed and well-nourished. No distress.  HENT:  Head: Normocephalic and atraumatic.  Right Ear: Hearing, tympanic membrane, external ear and ear canal normal.  Left Ear: Hearing, tympanic membrane, external ear and ear canal normal.  Nose: Nose normal.    Mouth/Throat: Uvula is midline, oropharynx is clear and moist and mucous membranes are normal. No oropharyngeal exudate, posterior oropharyngeal edema or posterior oropharyngeal erythema.  Eyes: Pupils are equal, round, and reactive to light. Conjunctivae and EOM are normal. No scleral icterus.  Neck: Normal range of motion. Neck supple. Carotid bruit is not present. No thyromegaly present.  Cardiovascular: Normal rate, regular rhythm, normal heart sounds and intact distal pulses.  No murmur heard. Pulses:      Radial pulses are 2+ on the right side, and 2+ on the left side.  Pulmonary/Chest: Effort normal and breath sounds normal. No respiratory distress. He has no wheezes. He has no rales.  Abdominal: Soft. Bowel sounds are normal. He exhibits no distension and no mass. There is no tenderness. There is no rebound and no guarding.  Genitourinary: Rectum normal and prostate normal. Rectal exam shows no external hemorrhoid, no internal hemorrhoid, no fissure, no mass, no tenderness and anal tone normal. Prostate is not enlarged (15gm) and not tender.  Musculoskeletal: Normal range of motion. He exhibits no edema.  Lymphadenopathy:    He has no cervical adenopathy.  Neurological: He is alert and oriented to person, place, and time.  CN grossly intact, station and gait intact  Skin: Skin is warm and dry. No rash noted.  Psychiatric: He has a normal mood and affect. His behavior is normal. Judgment and thought content normal.  Nursing note and vitals reviewed.  Results for orders placed or performed in visit on 08/29/17  Hepatitis C antibody  Result Value Ref Range   Hepatitis C Ab NON-REACTIVE NON-REACTI   SIGNAL TO CUT-OFF 0.02 <1.00  Microalbumin / creatinine urine ratio  Result Value Ref Range   Microalb, Ur <0.7 0.0 - 1.9 mg/dL   Creatinine,U 86.3 mg/dL   Microalb Creat Ratio 0.8 0.0 - 30.0 mg/g  PSA  Result Value Ref Range   PSA 1.81 0.10 - 4.00 ng/mL  Comprehensive metabolic  panel  Result Value Ref Range   Sodium 139 135 - 145 mEq/L   Potassium 4.1 3.5 - 5.1 mEq/L   Chloride 104 96 - 112 mEq/L   CO2 28 19 - 32 mEq/L   Glucose, Bld 98 70 - 99 mg/dL   BUN 17 6 - 23 mg/dL   Creatinine, Ser 0.93 0.40 - 1.50 mg/dL   Total Bilirubin 0.8 0.2 - 1.2 mg/dL   Alkaline Phosphatase 34 (L) 39 - 117 U/L   AST 26 0 - 37 U/L   ALT 31 0 - 53 U/L   Total Protein 6.9 6.0 - 8.3 g/dL   Albumin 4.0 3.5 - 5.2 g/dL   Calcium 9.0 8.4 - 10.5 mg/dL   GFR 85.28 >60.00 mL/min  Lipid panel  Result Value Ref Range   Cholesterol 162 0 - 200 mg/dL   Triglycerides 145.0 0.0 - 149.0 mg/dL   HDL 33.50 (L) >39.00 mg/dL   VLDL 29.0 0.0 - 40.0 mg/dL   LDL Cholesterol 100 (H) 0 - 99 mg/dL   Total CHOL/HDL Ratio  5    NonHDL 128.51       Assessment & Plan:   Problem List Items Addressed This Visit    Vitiligo    Notes progression. Has seen derm.      Routine general medical examination at a health care facility - Primary    Preventative protocols reviewed and updated unless pt declined. Discussed healthy diet and lifestyle.       Hypertension, essential    Chronic, stable. Continue losartan 50mg  daily.       Relevant Medications   losartan (COZAAR) 50 MG tablet   aspirin EC 81 MG tablet (Start on 09/04/2017)   Eye problem    ?retinal vein occlusion - has seen retinologist. Stable period. rec check with ophtho about baby aspirin.       Dyslipidemia    Chronic, improved. Not on statin. The 10-year ASCVD risk score Mikey Bussing DC Brooke Bonito., et al., 2013) is: 19.1%   Values used to calculate the score:     Age: 52 years     Sex: Male     Is Non-Hispanic African American: No     Diabetic: No     Tobacco smoker: No     Systolic Blood Pressure: 099 mmHg     Is BP treated: Yes     HDL Cholesterol: 33.5 mg/dL     Total Cholesterol: 162 mg/dL       Benign prostatic hyperplasia    Benign exam today. DRE/PSA reassuring.       Advanced care planning/counseling discussion    No  advanced directive in place. Wife would be health care POA. Packet provided last year -encouraged he work on this.           Meds ordered this encounter  Medications  . losartan (COZAAR) 50 MG tablet    Sig: Take 1 tablet (50 mg total) by mouth daily.    Dispense:  90 tablet    Refill:  3  . aspirin EC 81 MG tablet    Sig: Take 1 tablet (81 mg total) by mouth every Monday, Wednesday, and Friday.   No orders of the defined types were placed in this encounter.   Follow up plan: Return in about 1 year (around 09/04/2018) for annual exam, prior fasting for blood work, medicare wellness visit.  Ria Bush, MD

## 2017-09-20 NOTE — Progress Notes (Signed)
I reviewed health advisor's note, was available for consultation, and agree with documentation and plan.  

## 2017-10-09 ENCOUNTER — Other Ambulatory Visit: Payer: Self-pay | Admitting: Family Medicine

## 2017-12-10 DIAGNOSIS — H33102 Unspecified retinoschisis, left eye: Secondary | ICD-10-CM | POA: Diagnosis not present

## 2017-12-10 DIAGNOSIS — H3562 Retinal hemorrhage, left eye: Secondary | ICD-10-CM | POA: Diagnosis not present

## 2017-12-10 DIAGNOSIS — H35042 Retinal micro-aneurysms, unspecified, left eye: Secondary | ICD-10-CM | POA: Diagnosis not present

## 2017-12-10 DIAGNOSIS — H348322 Tributary (branch) retinal vein occlusion, left eye, stable: Secondary | ICD-10-CM | POA: Diagnosis not present

## 2018-04-19 ENCOUNTER — Encounter: Payer: Self-pay | Admitting: Internal Medicine

## 2018-08-07 DIAGNOSIS — H348322 Tributary (branch) retinal vein occlusion, left eye, stable: Secondary | ICD-10-CM | POA: Diagnosis not present

## 2018-08-07 DIAGNOSIS — H33102 Unspecified retinoschisis, left eye: Secondary | ICD-10-CM | POA: Diagnosis not present

## 2018-08-07 DIAGNOSIS — H3562 Retinal hemorrhage, left eye: Secondary | ICD-10-CM | POA: Diagnosis not present

## 2018-08-07 DIAGNOSIS — H35042 Retinal micro-aneurysms, unspecified, left eye: Secondary | ICD-10-CM | POA: Diagnosis not present

## 2018-09-08 ENCOUNTER — Ambulatory Visit: Payer: PPO

## 2018-09-08 ENCOUNTER — Encounter: Payer: PPO | Admitting: Family Medicine

## 2018-11-25 ENCOUNTER — Telehealth: Payer: Self-pay

## 2018-11-25 NOTE — Telephone Encounter (Signed)
Sorry to hear! Hope great grandson has a speedy recovery. I do recommend reschedule physical out 2 weeks after quarantine ends. And continue self quarantine.  Don't suggest testing at this time, but let us know if any covid symptoms develop and would want to test him right away.  Continue doing all he's doing now - continue vitamin C or emergenC (both have vitamin C), lots of fluids and rest.

## 2018-11-25 NOTE — Telephone Encounter (Signed)
Mrs Rinne said that pt has appt for annual exam on 11/27/18 with Dr Darnell Level. Pt had great grandson who is 57 1/71 yrs old at his home on 11/21/18; no mask and no social distancing; today pts great grandson tested positive for covid.  Mr and Mrs Daleen Snook has been self quarantining since 11/21/18 because found out one of great grandchilds friends at school tested positive for covid. Pt has no covid symptoms, no travel. Pt is taking extra vit c and emergen C also. Pt wants to know if should keep appt with Dr Darnell Level and does pt need to be tested for covid. ED precautions given for CP,SOB,diarrhea or high fever and Mrs Broomhead voiced understanding and will wait for cb on 106/20 or 11/26/18.

## 2018-11-26 NOTE — Telephone Encounter (Signed)
Spoke with pt relaying Dr. G's message. Pt verbalizes understanding.  

## 2018-11-27 ENCOUNTER — Encounter: Payer: PPO | Admitting: Family Medicine

## 2018-12-01 ENCOUNTER — Other Ambulatory Visit: Payer: Self-pay | Admitting: Family Medicine

## 2018-12-15 ENCOUNTER — Other Ambulatory Visit: Payer: Self-pay | Admitting: Family Medicine

## 2018-12-15 DIAGNOSIS — R350 Frequency of micturition: Secondary | ICD-10-CM

## 2018-12-15 DIAGNOSIS — N401 Enlarged prostate with lower urinary tract symptoms: Secondary | ICD-10-CM

## 2018-12-15 DIAGNOSIS — R748 Abnormal levels of other serum enzymes: Secondary | ICD-10-CM

## 2018-12-15 DIAGNOSIS — E785 Hyperlipidemia, unspecified: Secondary | ICD-10-CM

## 2018-12-18 ENCOUNTER — Other Ambulatory Visit (INDEPENDENT_AMBULATORY_CARE_PROVIDER_SITE_OTHER): Payer: PPO

## 2018-12-18 DIAGNOSIS — E785 Hyperlipidemia, unspecified: Secondary | ICD-10-CM | POA: Diagnosis not present

## 2018-12-18 DIAGNOSIS — R748 Abnormal levels of other serum enzymes: Secondary | ICD-10-CM | POA: Diagnosis not present

## 2018-12-18 DIAGNOSIS — N401 Enlarged prostate with lower urinary tract symptoms: Secondary | ICD-10-CM | POA: Diagnosis not present

## 2018-12-18 DIAGNOSIS — R350 Frequency of micturition: Secondary | ICD-10-CM | POA: Diagnosis not present

## 2018-12-18 LAB — COMPREHENSIVE METABOLIC PANEL
ALT: 28 U/L (ref 0–53)
AST: 23 U/L (ref 0–37)
Albumin: 4.3 g/dL (ref 3.5–5.2)
Alkaline Phosphatase: 42 U/L (ref 39–117)
BUN: 18 mg/dL (ref 6–23)
CO2: 31 mEq/L (ref 19–32)
Calcium: 9.3 mg/dL (ref 8.4–10.5)
Chloride: 102 mEq/L (ref 96–112)
Creatinine, Ser: 1.03 mg/dL (ref 0.40–1.50)
GFR: 71.05 mL/min (ref >60.00)
Glucose, Bld: 123 mg/dL — ABNORMAL HIGH (ref 70–99)
Potassium: 4.4 mEq/L (ref 3.5–5.1)
Sodium: 138 mEq/L (ref 135–145)
Total Bilirubin: 0.8 mg/dL (ref 0.2–1.2)
Total Protein: 7.2 g/dL (ref 6.0–8.3)

## 2018-12-18 LAB — LIPID PANEL
Cholesterol: 166 mg/dL (ref 0–200)
HDL: 33 mg/dL — ABNORMAL LOW (ref >39.00)
LDL Cholesterol: 101 mg/dL — ABNORMAL HIGH (ref 0–99)
NonHDL: 132.72
Total CHOL/HDL Ratio: 5
Triglycerides: 158 mg/dL — ABNORMAL HIGH (ref 0.0–149.0)
VLDL: 31.6 mg/dL (ref 0.0–40.0)

## 2018-12-19 LAB — TSH: TSH: 1.92 u[IU]/mL (ref 0.35–4.50)

## 2018-12-19 LAB — PSA: PSA: 3.62 ng/mL (ref 0.10–4.00)

## 2018-12-19 LAB — VITAMIN B12: Vitamin B-12: 797 pg/mL (ref 211–911)

## 2018-12-25 ENCOUNTER — Other Ambulatory Visit: Payer: Self-pay

## 2018-12-25 ENCOUNTER — Ambulatory Visit (INDEPENDENT_AMBULATORY_CARE_PROVIDER_SITE_OTHER): Payer: PPO | Admitting: Family Medicine

## 2018-12-25 ENCOUNTER — Encounter: Payer: Self-pay | Admitting: Family Medicine

## 2018-12-25 VITALS — BP 132/68 | HR 63 | Temp 97.9°F | Ht 72.0 in | Wt 198.2 lb

## 2018-12-25 DIAGNOSIS — R3129 Other microscopic hematuria: Secondary | ICD-10-CM

## 2018-12-25 DIAGNOSIS — R350 Frequency of micturition: Secondary | ICD-10-CM

## 2018-12-25 DIAGNOSIS — Z23 Encounter for immunization: Secondary | ICD-10-CM | POA: Diagnosis not present

## 2018-12-25 DIAGNOSIS — R319 Hematuria, unspecified: Secondary | ICD-10-CM

## 2018-12-25 DIAGNOSIS — E785 Hyperlipidemia, unspecified: Secondary | ICD-10-CM

## 2018-12-25 DIAGNOSIS — Z7189 Other specified counseling: Secondary | ICD-10-CM

## 2018-12-25 DIAGNOSIS — R972 Elevated prostate specific antigen [PSA]: Secondary | ICD-10-CM

## 2018-12-25 DIAGNOSIS — Z0000 Encounter for general adult medical examination without abnormal findings: Secondary | ICD-10-CM

## 2018-12-25 DIAGNOSIS — I10 Essential (primary) hypertension: Secondary | ICD-10-CM

## 2018-12-25 DIAGNOSIS — N401 Enlarged prostate with lower urinary tract symptoms: Secondary | ICD-10-CM

## 2018-12-25 LAB — POC URINALSYSI DIPSTICK (AUTOMATED)
Bilirubin, UA: NEGATIVE
Blood, UA: NEGATIVE
Glucose, UA: NEGATIVE
Ketones, UA: NEGATIVE
Leukocytes, UA: NEGATIVE
Nitrite, UA: NEGATIVE
Protein, UA: NEGATIVE
Spec Grav, UA: 1.015 (ref 1.010–1.025)
Urobilinogen, UA: 0.2 E.U./dL
pH, UA: 6 (ref 5.0–8.0)

## 2018-12-25 MED ORDER — LOSARTAN POTASSIUM 50 MG PO TABS: 50.0000 mg | ORAL_TABLET | Freq: Every day | ORAL | 3 refills | Status: DC

## 2018-12-25 NOTE — Assessment & Plan Note (Signed)
Advanced directive discussion - No living will. Wife would be health care POA. Would accept resuscitation--no prolonged ventilation. Probably no tube feeds if cognitively unaware. Working on getting this notarized.

## 2018-12-25 NOTE — Assessment & Plan Note (Signed)
Chronic, stable off statin. The 10-year ASCVD risk score Mikey Bussing DC Brooke Bonito., et al., 2013) is: 25.3%   Values used to calculate the score:     Age: 73 years     Sex: Male     Is Non-Hispanic African American: No     Diabetic: No     Tobacco smoker: No     Systolic Blood Pressure: [id number] mmHg     Is BP treated: Yes     HDL Cholesterol: 33 mg/dL     Total Cholesterol: 166 mg/dL

## 2018-12-25 NOTE — Assessment & Plan Note (Addendum)
Preventative protocols reviewed and updated unless pt declined. Discussed healthy diet and lifestyle.  

## 2018-12-25 NOTE — Patient Instructions (Addendum)
Flu shot today Colonoscopy recall letter printed out for you If interested, check with pharmacy about new 2 shot shingles series (shingrix).  Sugar was a bit high -back off added sugars in the diet Prostate levels went up some - return in 6 months for labs only to recheck prostate  Return in 1 year for next physical   Health Maintenance After Age 71 After age 65, you are at a higher risk for certain long-term diseases and infections as well as injuries from falls. Falls are a major cause of broken bones and head injuries in people who are older than age 66. Getting regular preventive care can help to keep you healthy and well. Preventive care includes getting regular testing and making lifestyle changes as recommended by your health care provider. Talk with your health care provider about:  Which screenings and tests you should have. A screening is a test that checks for a disease when you have no symptoms.  A diet and exercise plan that is right for you. What should I know about screenings and tests to prevent falls? Screening and testing are the best ways to find a health problem early. Early diagnosis and treatment give you the best chance of managing medical conditions that are common after age 58. Certain conditions and lifestyle choices may make you more likely to have a fall. Your health care provider may recommend:  Regular vision checks. Poor vision and conditions such as cataracts can make you more likely to have a fall. If you wear glasses, make sure to get your prescription updated if your vision changes.  Medicine review. Work with your health care provider to regularly review all of the medicines you are taking, including over-the-counter medicines. Ask your health care provider about any side effects that may make you more likely to have a fall. Tell your health care provider if any medicines that you take make you feel dizzy or sleepy.  Osteoporosis screening. Osteoporosis is a  condition that causes the bones to get weaker. This can make the bones weak and cause them to break more easily.  Blood pressure screening. Blood pressure changes and medicines to control blood pressure can make you feel dizzy.  Strength and balance checks. Your health care provider may recommend certain tests to check your strength and balance while standing, walking, or changing positions.  Foot health exam. Foot pain and numbness, as well as not wearing proper footwear, can make you more likely to have a fall.  Depression screening. You may be more likely to have a fall if you have a fear of falling, feel emotionally low, or feel unable to do activities that you used to do.  Alcohol use screening. Using too much alcohol can affect your balance and may make you more likely to have a fall. What actions can I take to lower my risk of falls? General instructions  Talk with your health care provider about your risks for falling. Tell your health care provider if: ? You fall. Be sure to tell your health care provider about all falls, even ones that seem minor. ? You feel dizzy, sleepy, or off-balance.  Take over-the-counter and prescription medicines only as told by your health care provider. These include any supplements.  Eat a healthy diet and maintain a healthy weight. A healthy diet includes low-fat dairy products, low-fat (lean) meats, and fiber from whole grains, beans, and lots of fruits and vegetables. Home safety  Remove any tripping hazards, such as rugs, cords,  and clutter.  Install safety equipment such as grab bars in bathrooms and safety rails on stairs.  Keep rooms and walkways well-lit. Activity   Follow a regular exercise program to stay fit. This will help you maintain your balance. Ask your health care provider what types of exercise are appropriate for you.  If you need a cane or walker, use it as recommended by your health care provider.  Wear supportive shoes  that have nonskid soles. Lifestyle  Do not drink alcohol if your health care provider tells you not to drink.  If you drink alcohol, limit how much you have: ? 0-1 drink a day for women. ? 0-2 drinks a day for men.  Be aware of how much alcohol is in your drink. In the [place] one drink equals one typical bottle of beer (12 oz), one-half glass of wine (5 oz), or one shot of hard liquor (1 oz).  Do not use any products that contain nicotine or tobacco, such as cigarettes and e-cigarettes. If you need help quitting, ask your health care provider. Summary  Having a healthy lifestyle and getting preventive care can help to protect your health and wellness after age 75.  Screening and testing are the best way to find a health problem early and help you avoid having a fall. Early diagnosis and treatment give you the best chance for managing medical conditions that are more common for people who are older than age 30.  Falls are a major cause of broken bones and head injuries in people who are older than age 102. Take precautions to prevent a fall at home.  Work with your health care provider to learn what changes you can make to improve your health and wellness and to prevent falls. This information is not intended to replace advice given to you by your health care provider. Make sure you discuss any questions you have with your health care provider. Document Released: 12/19/2016 Document Revised: 05/29/2018 Document Reviewed: 12/19/2016 Elsevier Patient Education  2020 [employer].

## 2018-12-25 NOTE — Assessment & Plan Note (Signed)

## 2018-12-25 NOTE — Assessment & Plan Note (Signed)
Benign exam, but PSA bump noted - I have asked him to return in 6 months for rpt PSA.

## 2018-12-25 NOTE — Assessment & Plan Note (Signed)
Chronic, stable. Continue losartan 50mg daily.  

## 2018-12-25 NOTE — Assessment & Plan Note (Addendum)
1+ RBC on DOT urine dipstick. However normal UA as well as normal microscopy today.

## 2018-12-25 NOTE — Progress Notes (Addendum)
This visit was conducted in person.  BP 132/68 (BP Location: Left Arm, Patient Position: Sitting, Cuff Size: Normal)   Pulse 63   Temp 97.9 F (36.6 C) (Temporal)   Ht 6' (1.829 m)   Wt 198 lb 3 oz (89.9 kg)   SpO2 97%   BMI 26.88 kg/m    CC: AMW Subjective:    Patient ID: Jonathon Chavez, male    DOB: 05-Nov-1945, 73 y.o.   MRN: [id number]  HPI: Jonathon Chavez is a 73 y.o. male presenting on 12/25/2018 for Medicare Wellness (Pt provided results (to keep) of recent UA done at DOT exam.  Told to inform PCP urine contained blood.  Pt accomapnied by wife, Jonathon Chavez (temp 98.1). )   Did not see health advisor this year.   Hearing Screening   125Hz  250Hz  500Hz  1000Hz  2000Hz  3000Hz  4000Hz  6000Hz  8000Hz   Right ear:   20 25 20   40    Left ear:   20 25 20   40      Visual Acuity Screening   Right eye Left eye Both eyes  Without correction: 20/20 20/20 20/15   With correction:         Office Visit from 12/25/2018 in [facility] [place]  PHQ-2 Total Score  0      Fall Risk  12/25/2018 08/29/2017 08/27/2016 02/08/2014 02/06/2013  Falls in the past year? 0 No No No No      Wife endorses episode of depression after failed recent DOT physical.  He did have some blood in urine during latest DOT physical.   He notices some L lateral anterior thigh numbness/paresthesias over the last month. Itchy at times. No skin changes. Doesn't wear tight pants. No back pain. No other paresthesias.   Preventative: Colon cancer screening2/2015- sessile serrated polyp, diverticulosis, rpt 5 yrs (Pyrtle) Prostate cancer screening - yearly DRE/PSA (brother with prostate cancer) Lung cancer screening - not eligible Flu shot yearly Tdap 2014 Pneumovax 2014, prevnar 2015 zostavax- 2012 shingrix - discussed - to check at pharmacy Advanced directive discussion - No living will. Wife would be health care POA. Would accept resuscitation--no prolonged ventilation. Probably no tube feeds if  cognitively unaware. Working on getting this notarized.  Seat belt use discussed. Sunscreen use and discussed. No changing moles on skin.  Ex smoker (quit 1975)  Alcohol - none Dentist - Q6 mo Eye exam - yearly - saw retinologist until released Bowel - no constipation Bladder - no incontinence, notes weakening of stream  Lives with wife.  Occupation: truck Geophysicist/field seismologist for [employer] Activity: increased walking - walks dog, physical work  Diet: good water, fruits/vegetables daily      Relevant past medical, surgical, family and social history reviewed and updated as indicated. Interim medical history since our last visit reviewed. Allergies and medications reviewed and updated. Outpatient Medications Prior to Visit  Medication Sig Dispense Refill  . Cholecalciferol (VITAMIN D3 PO) Take by mouth.    Marland Kitchen MAGNESIUM OXIDE PO Take by mouth.    . Multiple Vitamins-Minerals (EMERGEN-C IMMUNE PLUS PO) Take by mouth daily as needed.    . Multiple Vitamins-Minerals (OCUVITE PO) Take by mouth daily.    Marland Kitchen losartan (COZAAR) 50 MG tablet TAKE ONE TABLET BY MOUTH DAILY 90 tablet 0  . aspirin EC 81 MG tablet Take 1 tablet (81 mg total) by mouth every Monday, Wednesday, and Friday.    . Cyanocobalamin (VITAMIN B12 PO) Take by mouth.     No facility-administered  medications prior to visit.      Per HPI unless specifically indicated in ROS section below Review of Systems  Constitutional: Negative for activity change, appetite change, chills, fatigue, fever and unexpected weight change.  HENT: Negative for hearing loss.   Eyes: Negative for visual disturbance.  Respiratory: Negative for cough, chest tightness, shortness of breath and wheezing.   Cardiovascular: Negative for chest pain, palpitations and leg swelling.  Gastrointestinal: Negative for abdominal distention, abdominal pain, blood in stool, constipation, diarrhea, nausea and vomiting.  Genitourinary: Positive for hematuria (recent DOT  physical). Negative for difficulty urinating.  Musculoskeletal: Negative for arthralgias, myalgias and neck pain.  Skin: Negative for rash.  Neurological: Negative for dizziness, seizures, syncope and headaches.  Hematological: Negative for adenopathy. Does not bruise/bleed easily.  Psychiatric/Behavioral: Positive for dysphoric mood (see HPI). The patient is not nervous/anxious.    Objective:    BP 132/68 (BP Location: Left Arm, Patient Position: Sitting, Cuff Size: Normal)   Pulse 63   Temp 97.9 F (36.6 C) (Temporal)   Ht 6' (1.829 m)   Wt 198 lb 3 oz (89.9 kg)   SpO2 97%   BMI 26.88 kg/m   Wt Readings from Last 3 Encounters:  12/25/18 198 lb 3 oz (89.9 kg)  09/03/17 194 lb 4 oz (88.1 kg)  08/29/17 199 lb 8 oz (90.5 kg)    Physical Exam Vitals signs and nursing note reviewed.  Constitutional:      General: He is not in acute distress.    Appearance: Normal appearance. He is well-developed. He is not ill-appearing.  HENT:     Head: Normocephalic and atraumatic.     Right Ear: Hearing, tympanic membrane, ear canal and external ear normal.     Left Ear: Hearing, tympanic membrane, ear canal and external ear normal.     Nose: Nose normal.     Mouth/Throat:     Mouth: Mucous membranes are moist.     Pharynx: Oropharynx is clear. Uvula midline. No posterior oropharyngeal erythema.  Eyes:     General: No scleral icterus.    Extraocular Movements: Extraocular movements intact.     Conjunctiva/sclera: Conjunctivae normal.     Pupils: Pupils are equal, round, and reactive to light.  Neck:     Musculoskeletal: Normal range of motion and neck supple.  Cardiovascular:     Rate and Rhythm: Normal rate and regular rhythm.     Pulses: Normal pulses.          Radial pulses are 2+ on the right side and 2+ on the left side.     Heart sounds: Normal heart sounds. No murmur.  Pulmonary:     Effort: Pulmonary effort is normal. No respiratory distress.     Breath sounds: Normal breath  sounds. No wheezing, rhonchi or rales.  Abdominal:     General: Abdomen is flat. Bowel sounds are normal. There is no distension.     Palpations: Abdomen is soft. There is no mass.     Tenderness: There is no abdominal tenderness. There is no guarding or rebound.     Hernia: No hernia is present.  Genitourinary:    Prostate: Normal. Not enlarged (20gm), not tender and no nodules present.     Rectum: Normal. No mass, tenderness, anal fissure, external hemorrhoid or internal hemorrhoid. Normal anal tone.  Musculoskeletal: Normal range of motion.     Right lower leg: No edema.     Left lower leg: No edema.  Lymphadenopathy:  Cervical: No cervical adenopathy.  Skin:    General: Skin is warm and dry.     Findings: No rash.  Neurological:     General: No focal deficit present.     Mental Status: He is alert and oriented to person, place, and time.     Comments:  CN grossly intact, station and gait intact Recall 3/3 Calculation 2/5 DOLRW  Psychiatric:        Mood and Affect: Mood normal.        Behavior: Behavior normal.        Thought Content: Thought content normal.        Judgment: Judgment normal.       Results for orders placed or performed in visit on 12/25/18  POCT Urinalysis Dipstick (Automated)  Result Value Ref Range   Color, UA yellow    Clarity, UA clear    Glucose, UA Negative Negative   Bilirubin, UA negative    Ketones, UA negative    Spec Grav, UA 1.015 1.010 - 1.025   Blood, UA negative    pH, UA 6.0 5.0 - 8.0   Protein, UA Negative Negative   Urobilinogen, UA 0.2 0.2 or 1.0 E.U./dL   Nitrite, UA negative    Leukocytes, UA Negative Negative   Assessment & Plan:   Problem List Items Addressed This Visit    Routine general medical examination at a health care facility    Preventative protocols reviewed and updated unless pt declined. Discussed healthy diet and lifestyle.       Microhematuria    1+ RBC on DOT urine dipstick. However normal UA as well  as normal microscopy today.       Medicare annual wellness visit, subsequent - Primary    I have personally reviewed the Medicare Annual Wellness questionnaire and have noted 1. The patient's medical and social history 2. Their use of alcohol, tobacco or illicit drugs 3. Their current medications and supplements 4. The patient's functional ability including ADL's, fall risks, home safety risks and hearing or visual impairment. Cognitive function has been assessed and addressed as indicated.  5. Diet and physical activity 6. Evidence for depression or mood disorders The patients weight, height, BMI have been recorded in the chart. I have made referrals, counseling and provided education to the patient based on review of the above and I have provided the pt with a written personalized care plan for preventive services. Provider list updated.. See scanned questionairre as needed for further documentation. Reviewed preventative protocols and updated unless pt declined.       Hypertension, essential    Chronic, stable. Continue losartan 50mg  daily.       Relevant Medications   losartan (COZAAR) 50 MG tablet   Dyslipidemia    Chronic, stable off statin. The 10-year ASCVD risk score Mikey Bussing DC Brooke Bonito., et al., 2013) is: 25.3%   Values used to calculate the score:     Age: 58 years     Sex: Male     Is Non-Hispanic African American: No     Diabetic: No     Tobacco smoker: No     Systolic Blood Pressure: [id number] mmHg     Is BP treated: Yes     HDL Cholesterol: 33 mg/dL     Total Cholesterol: 166 mg/dL       Benign prostatic hyperplasia    Benign exam, but PSA bump noted - I have asked him to return in 6 months for rpt PSA.  Relevant Orders   PSA   Advanced care planning/counseling discussion    Advanced directive discussion - No living will. Wife would be health care POA. Would accept resuscitation--no prolonged ventilation. Probably no tube feeds if cognitively unaware. Working on  getting this notarized.        Other Visit Diagnoses    Need for influenza vaccination       Relevant Orders   Flu Vaccine QUAD High Dose(Fluad) (Completed)   Hematuria, unspecified type       Relevant Orders   POCT Urinalysis Dipstick (Automated) (Completed)   Elevated PSA       Relevant Orders   PSA       Meds ordered this encounter  Medications  . losartan (COZAAR) 50 MG tablet    Sig: Take 1 tablet (50 mg total) by mouth daily.    Dispense:  90 tablet    Refill:  3   Orders Placed This Encounter  Procedures  . Flu Vaccine QUAD High Dose(Fluad)  . PSA    Standing Status:   Future    Standing Expiration Date:   12/25/2019  . POCT Urinalysis Dipstick (Automated)    Patient instructions: Flu shot today Colonoscopy recall letter printed out for you If interested, check with pharmacy about new 2 shot shingles series (shingrix).  Sugar was a bit high -back off added sugars in the diet Prostate levels went up some - return in 6 months for labs only to recheck prostate  Return in 1 year for next physical   Follow up plan: Return in about 1 year (around 12/25/2019) for annual exam, prior fasting for blood work, medicare wellness visit.  Ria Bush, MD

## 2019-06-25 ENCOUNTER — Other Ambulatory Visit (INDEPENDENT_AMBULATORY_CARE_PROVIDER_SITE_OTHER): Payer: PPO

## 2019-06-25 ENCOUNTER — Other Ambulatory Visit: Payer: Self-pay

## 2019-06-25 DIAGNOSIS — R350 Frequency of micturition: Secondary | ICD-10-CM | POA: Diagnosis not present

## 2019-06-25 DIAGNOSIS — R972 Elevated prostate specific antigen [PSA]: Secondary | ICD-10-CM | POA: Diagnosis not present

## 2019-06-25 DIAGNOSIS — N401 Enlarged prostate with lower urinary tract symptoms: Secondary | ICD-10-CM | POA: Diagnosis not present

## 2019-06-25 LAB — PSA: PSA: 2.24 ng/mL (ref 0.10–4.00)

## 2019-07-28 ENCOUNTER — Encounter: Payer: Self-pay | Admitting: Family Medicine

## 2019-07-28 DIAGNOSIS — Z1211 Encounter for screening for malignant neoplasm of colon: Secondary | ICD-10-CM

## 2019-07-28 DIAGNOSIS — Z800 Family history of malignant neoplasm of digestive organs: Secondary | ICD-10-CM

## 2019-07-28 DIAGNOSIS — K219 Gastro-esophageal reflux disease without esophagitis: Secondary | ICD-10-CM

## 2019-08-03 ENCOUNTER — Telehealth: Payer: Self-pay | Admitting: Family Medicine

## 2019-08-03 ENCOUNTER — Encounter: Payer: Self-pay | Admitting: Family Medicine

## 2019-08-03 NOTE — Telephone Encounter (Signed)
Patient sent my chart appointment request in regards to scheduling a Colon Cancer Screening,    Please advise  Patient message below      I have tried to call the office regarding this Colon Cancer Screening.  After waiting through being the 5th caller, then down to caller number 1 a recording comes on and tells me that there is a problem with the system.  This has happened on 3 separate occasions.  I would like to talk with someone about getting this scheduled.  Thank you.... Jonathon Chavez

## 2019-08-03 NOTE — Telephone Encounter (Signed)
See previous mychart message I responded to today.

## 2019-08-04 ENCOUNTER — Encounter: Payer: Self-pay | Admitting: Nurse Practitioner

## 2019-08-11 ENCOUNTER — Ambulatory Visit (INDEPENDENT_AMBULATORY_CARE_PROVIDER_SITE_OTHER): Payer: PPO | Admitting: Ophthalmology

## 2019-08-11 ENCOUNTER — Encounter (INDEPENDENT_AMBULATORY_CARE_PROVIDER_SITE_OTHER): Payer: Self-pay | Admitting: Ophthalmology

## 2019-08-11 ENCOUNTER — Other Ambulatory Visit: Payer: Self-pay

## 2019-08-11 DIAGNOSIS — H35042 Retinal micro-aneurysms, unspecified, left eye: Secondary | ICD-10-CM | POA: Diagnosis not present

## 2019-08-11 DIAGNOSIS — H33102 Unspecified retinoschisis, left eye: Secondary | ICD-10-CM | POA: Insufficient documentation

## 2019-08-11 DIAGNOSIS — H348322 Tributary (branch) retinal vein occlusion, left eye, stable: Secondary | ICD-10-CM

## 2019-08-11 NOTE — Progress Notes (Signed)
08/11/2019     CHIEF COMPLAINT Patient presents for Retina Follow Up   HISTORY OF PRESENT ILLNESS: Jonathon Chavez is a 74 y.o. male who presents to the clinic today for:   HPI    Retina Follow Up    Patient presents with  CRVO/BRVO.  In left eye.  Severity is moderate.  Duration of 1 year.  Since onset it is stable.  I, the attending physician,  performed the HPI with the patient and updated documentation appropriately.          Comments    1 Year BRVO f\u OU. OCT  Pt states vision is stable. Pt sees occasional floaters.       Last edited by Tilda Franco on 08/11/2019  1:23 PM. (History)      Referring physician: Ria Bush, MD [place],  [place] [id number]  HISTORICAL INFORMATION:   Selected notes from the [facility]: No current outpatient medications on file. (Ophthalmic Drugs)   No current facility-administered medications for this visit. (Ophthalmic Drugs)   Current Outpatient Medications (Other)  Medication Sig  . Cholecalciferol (VITAMIN D3 PO) Take by mouth.  . losartan (COZAAR) 50 MG tablet Take 1 tablet (50 mg total) by mouth daily.  Marland Kitchen MAGNESIUM OXIDE PO Take by mouth.  . Multiple Vitamins-Minerals (EMERGEN-C IMMUNE PLUS PO) Take by mouth daily as needed.  . Multiple Vitamins-Minerals (OCUVITE PO) Take by mouth daily.   No current facility-administered medications for this visit. (Other)      REVIEW OF SYSTEMS:    ALLERGIES Allergies  Allergen Reactions  . Lisinopril Cough    Dry cough    PAST MEDICAL HISTORY Past Medical History:  Diagnosis Date  . BPH (benign prostatic hypertrophy)   . GERD (gastroesophageal reflux disease)   . Hemorrhoids   . Hypertension    Past Surgical History:  Procedure Laterality Date  . CATARACT EXTRACTION Bilateral 2018   Dr Clent Jacks  . CHOLECYSTECTOMY  8/14   Dr Pat Patrick  . COLONOSCOPY  03/2013   sessile serrated polyp,  diverticulosis, rpt 5 yrs (Pyrtle)    FAMILY HISTORY Family History  Problem Relation Age of Onset  . Diabetes Maternal Grandmother   . Dementia Father        alz  . CAD Father 90       5v CABG  . AAA (abdominal aortic aneurysm) Father   . CVA Father 54  . Heart disease Father   . Cancer Mother        ?ovarian cancer  . Cancer Brother        esophageal cancer  . Cancer Brother 79       prostate cancer    SOCIAL HISTORY Social History   Tobacco Use  . Smoking status: Former Smoker    Quit date: 03/25/1973    Years since quitting: 46.4  . Smokeless tobacco: Never Used  Vaping Use  . Vaping Use: Never used  Substance Use Topics  . Alcohol use: No  . Drug use: No         OPHTHALMIC EXAM: Base Eye Exam    Visual Acuity (Snellen - Linear)      Right Left   Dist [place] 20/25 + 20/20       Tonometry (Tonopen, 1:28 PM)      Right Left   Pressure 19 22       Pupils  Pupils Dark Light Shape React APD   Right PERRL 4 3 Round Brisk None   Left PERRL 4 3 Round Brisk None       Visual Fields (Counting fingers)      Left Right    Full Full       Neuro/Psych    Oriented x3: Yes   Mood/Affect: Normal       Dilation    Both eyes: 1.0% Mydriacyl, 2.5% Phenylephrine @ 1:28 PM        Slit Lamp and Fundus Exam    External Exam      Right Left   External Normal Normal       Slit Lamp Exam      Right Left   Lens Centered posterior chamber intraocular lens Centered posterior chamber intraocular lens          IMAGING AND PROCEDURES  Imaging and Procedures for 08/11/19           ASSESSMENT/PLAN:  No problem-specific Assessment & Plan notes found for this encounter.      ICD-10-CM   1. Branch retinal vein occlusion of left eye, unspecified complication status  H21.2248 OCT, Retina - OU - Both Eyes  2. Left retinoschisis  H33.102   3. Retinal microaneurysm of left eye  H35.042     1.  2.  3.  Ophthalmic Meds Ordered this visit:  No  orders of the defined types were placed in this encounter.      No follow-ups on file.  There are no Patient Instructions on file for this visit.   Explained the diagnoses, plan, and follow up with the patient and they expressed understanding.  Patient expressed understanding of the importance of proper follow up care.   Clent Demark  M.D. Diseases & Surgery of the Retina and Vitreous Retina & Diabetic [place] 08/11/19     Abbreviations: M myopia (nearsighted); A astigmatism; H hyperopia (farsighted); P presbyopia; Mrx spectacle prescription;  CTL contact lenses; OD right eye; OS left eye; OU both eyes  XT exotropia; ET esotropia; PEK punctate epithelial keratitis; PEE punctate epithelial erosions; DES dry eye syndrome; MGD meibomian gland dysfunction; ATs artificial tears; PFAT's preservative free artificial tears; Pink nuclear sclerotic cataract; PSC posterior subcapsular cataract; ERM epi-retinal membrane; PVD posterior vitreous detachment; RD retinal detachment; DM diabetes mellitus; DR diabetic retinopathy; NPDR non-proliferative diabetic retinopathy; PDR proliferative diabetic retinopathy; CSME clinically significant macular edema; DME diabetic macular edema; dbh dot blot hemorrhages; CWS cotton wool spot; POAG primary open angle glaucoma; C/D cup-to-disc ratio; HVF humphrey visual field; GVF goldmann visual field; OCT optical coherence tomography; IOP intraocular pressure; BRVO Branch retinal vein occlusion; CRVO central retinal vein occlusion; CRAO central retinal artery occlusion; BRAO branch retinal artery occlusion; RT retinal tear; SB scleral buckle; PPV pars plana vitrectomy; VH Vitreous hemorrhage; PRP panretinal laser photocoagulation; IVK intravitreal kenalog; VMT vitreomacular traction; MH Macular hole;  NVD neovascularization of the disc; NVE neovascularization elsewhere; AREDS age related eye disease study; ARMD age related macular degeneration; POAG primary open angle  glaucoma; EBMD epithelial/anterior basement membrane dystrophy; ACIOL anterior chamber intraocular lens; IOL intraocular lens; PCIOL posterior chamber intraocular lens; Phaco/IOL phacoemulsification with intraocular lens placement; Startup photorefractive keratectomy; LASIK laser assisted in situ keratomileusis; HTN hypertension; DM diabetes mellitus; COPD chronic obstructive pulmonary disease

## 2019-08-11 NOTE — Assessment & Plan Note (Signed)
Old branch retinal vein occlusion now partially collateralized.  Some peripheral retinal hemorrhages noted in the distal periphery inferiorly, no neovascularization and no retinal edema no cotton-wool spots nor nonperfusion.  Observe

## 2019-08-11 NOTE — Assessment & Plan Note (Signed)
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

## 2019-09-09 ENCOUNTER — Ambulatory Visit: Payer: PPO | Admitting: Nurse Practitioner

## 2019-09-09 ENCOUNTER — Encounter: Payer: Self-pay | Admitting: Nurse Practitioner

## 2019-09-09 VITALS — BP 122/74 | HR 75 | Ht 72.0 in | Wt 196.8 lb

## 2019-09-09 DIAGNOSIS — Z800 Family history of malignant neoplasm of digestive organs: Secondary | ICD-10-CM | POA: Diagnosis not present

## 2019-09-09 DIAGNOSIS — Z86010 Personal history of colonic polyps: Secondary | ICD-10-CM

## 2019-09-09 DIAGNOSIS — K219 Gastro-esophageal reflux disease without esophagitis: Secondary | ICD-10-CM | POA: Diagnosis not present

## 2019-09-09 MED ORDER — PANTOPRAZOLE SODIUM 40 MG PO TBEC: 40.0000 mg | DELAYED_RELEASE_TABLET | Freq: Every day | ORAL | 1 refills | Status: DC

## 2019-09-09 MED ORDER — NA SULFATE-K SULFATE-MG SULF 17.5-3.13-1.6 GM/177ML PO SOLN: 1.0000 | Freq: Once | ORAL | 0 refills | Status: AC

## 2019-09-09 NOTE — Progress Notes (Signed)
09/09/2019 Jonathon Chavez [id number] [birth year]   CHIEF COMPLAINT:  Schedule an EGD and colonoscopy   HISTORY OF PRESENT ILLNESS:  Jonathon Chavez is a 74 year old male with a past medical history of hypertension, BPH, vitiligo, GERD and colon polyps. S/P cholecystectomy 2014. He presents to our office today as referred by Dr. Ria Bush for GERD evaluation and to schedule an EGD and colonoscopy. His wife is present. He complains of having heartburn "all of my life". He has heartburn daily with night time indigestion 3 times weekly. He takes TUMs at night when needed with symptom relief. No dysphagia. His brother died from esophageal cancer and his younger brother had an EGD which required esophageal ablation. He denies having any upper or lower abdominal pain. He is passing a normal brown formed stool once daily. He has hemorrhoidal pain, swelling with "gushes" of blood per the rectum which occurs once monthly usually after straining to pass a hard stool. His most recent colonoscopy was 04/08/2013 and a polyp was removed from the ascending colon and diverticulosis to the ascending, descending and sigmoid colon were noted. No family history of colon cancer. Infrequent NSAID use. He and his wife had Covid 19 infection 02/2019. He received his 1st Covid vaccination last month and he intends to have his 2nd vaccination today. No other complaints today.    Colonoscopy 04/08/2013 by Dr. Hilarie Fredrickson: 90mm sessile serrated polyp removed from the ascending colon Diverticulosis ascending, descending and sigmoid colon  Colonoscopy 02/20/2003:  No polyps (procedure report not found in Epic or Care everywhere)  Past Medical History:  Diagnosis Date  . BPH (benign prostatic hypertrophy)   . GERD (gastroesophageal reflux disease)   . Hemorrhoids   . Hypertension    Past Surgical History:  Procedure Laterality Date  . CATARACT EXTRACTION Bilateral 2018   Dr Clent Jacks  . CHOLECYSTECTOMY  8/14    Dr Pat Patrick  . COLONOSCOPY  03/2013   sessile serrated polyp, diverticulosis, rpt 5 yrs (Pyrtle)   Social History: His married. Retired Administrator. He has one daughter. Nonsmoker. No alcohol or drug use.   Family History: Brother died from esophageal cancer. Brother had prostate cancer. Father with AAA and CVA. Mother had ovarian cancer. Paternal grandmother had pancreatic cancer.   Allergies  Allergen Reactions  . Lisinopril Cough    Dry cough     Outpatient Encounter Medications as of 09/09/2019  Medication Sig  . Cholecalciferol (VITAMIN D3 PO) Take by mouth.  . losartan (COZAAR) 50 MG tablet Take 1 tablet (50 mg total) by mouth daily.  Marland Kitchen MAGNESIUM OXIDE PO Take by mouth.  . Multiple Vitamins-Minerals (EMERGEN-C IMMUNE PLUS PO) Take by mouth daily as needed.  . Multiple Vitamins-Minerals (OCUVITE PO) Take by mouth daily.   No facility-administered encounter medications on file as of 09/09/2019.     REVIEW OF SYSTEMS:  Gen: Denies fever, sweats or chills. No weight loss.  CV: Denies chest pain, palpitations or edema. Resp: Denies cough, shortness of breath of hemoptysis.  GI: Se HPI. GU : Denies urinary burning, blood in urine, increased urinary frequency or incontinence. MS: Denies joint pain, muscles aches or weakness. Derm: Denies rash, itchiness, skin lesions or unhealing ulcers. Psych: Denies depression, anxiety, memory los or confusion. Heme: Denies bruising, bleeding. Neuro:  Denies headaches, dizziness or paresthesias. Endo:  Denies any problems with DM, thyroid or adrenal function.  PHYSICAL EXAM: BP 122/74 (BP Location: Left Arm, Patient Position: Sitting)   Pulse  75   Ht 6' (1.829 m)   Wt 196 lb 12.8 oz (89.3 kg)   SpO2 98%   BMI 26.69 kg/m  General: Well developed  74 year old male in no acute distress. Head: Normocephalic and atraumatic. Eyes:  Sclerae non-icteric, conjunctive pink. Ears: Normal auditory acuity. Mouth: Dentition intact. No ulcers or  lesions.  Neck: Supple, no lymphadenopathy or thyromegaly.  Lungs: Clear bilaterally to auscultation without wheezes, crackles or rhonchi. Heart: Regular rate and rhythm. No murmur, rub or gallop appreciated.  Abdomen: Soft, nontender, non distended. No masses. No hepatosplenomegaly. Normoactive bowel sounds x 4 quadrants.  Rectal: Patient declined rectal exam today as he is not having active rectal bleeding, to proceed with colonoscopy for further evaluation.  Musculoskeletal: Symmetrical with no gross deformities. Skin: Warm and dry. No rash or lesions on visible extremities. Extremities: No edema. Neurological: Alert oriented x 4, no focal deficits.  Psychological:  Alert and cooperative. Normal mood and affect.  ASSESSMENT AND PLAN:  23. 74 year old male with chronic acid reflux. Family history of esophageal cancer. -EGD benefits and risks discussed including risk with sedation, risk of bleeding, perforation and infection  -Pantoprazole 40mg  one po QD. Continue Famotidine 20mg  at dinner time -GERD handout   2. History of colon polyps -Colonoscopy benefits and risks discussed including risk with sedation, risk of bleeding, perforation and infection   3. Rectal bleeding, patient reports history of hemorrhoids -Consider future hemorrhoid banding, to discuss further with Dr. Hilarie Fredrickson after colonoscopy completed  -Avoid straining. Discussed Miralax as needed -Patient to call our office if his rectal bleeding worsens prior to his colonoscopy date -Add CBC to orders            CC:  Ria Bush, MD

## 2019-09-09 NOTE — Patient Instructions (Signed)
If you are age 72 or older, your body mass index should be between 23-30. Your Body mass index is 26.69 kg/m. If this is out of the aforementioned range listed, please consider follow up with your Primary Care Provider.  If you are age 2 or younger, your body mass index should be between 19-25. Your Body mass index is 26.69 kg/m. If this is out of the aformentioned range listed, please consider follow up with your Primary Care Provider.   1. Pantoprazole 40mg  1 tablet daily 30 minutes before breakfast 2. Continue with pepcid prior to dinner. Gastroesophageal Reflux Disease, Adult Gastroesophageal reflux (GER) happens when acid from the stomach flows up into the tube that connects the mouth and the stomach (esophagus). Normally, food travels down the esophagus and stays in the stomach to be digested. With GER, food and stomach acid sometimes move back up into the esophagus. You may have a disease called gastroesophageal reflux disease (GERD) if the reflux:  Happens often.  Causes frequent or very bad symptoms.  Causes problems such as damage to the esophagus. When this happens, the esophagus becomes sore and swollen (inflamed). Over time, GERD can make small holes (ulcers) in the lining of the esophagus. What are the causes? This condition is caused by a problem with the muscle between the esophagus and the stomach. When this muscle is weak or not normal, it does not close properly to keep food and acid from coming back up from the stomach. The muscle can be weak because of:  Tobacco use.  Pregnancy.  Having a certain type of hernia (hiatal hernia).  Alcohol use.  Certain foods and drinks, such as coffee, chocolate, onions, and peppermint. What increases the risk? You are more likely to develop this condition if you:  Are overweight.  Have a disease that affects your connective tissue.  Use NSAID medicines. What are the signs or symptoms? Symptoms of this condition  include:  Heartburn.  Difficult or painful swallowing.  The feeling of having a lump in the throat.  A bitter taste in the mouth.  Bad breath.  Having a lot of saliva.  Having an upset or bloated stomach.  Belching.  Chest pain. Different conditions can cause chest pain. Make sure you see your doctor if you have chest pain.  Shortness of breath or noisy breathing (wheezing).  Ongoing (chronic) cough or a cough at night.  Wearing away of the surface of teeth (tooth enamel).  Weight loss. How is this treated? Treatment will depend on how bad your symptoms are. Your doctor may suggest:  Changes to your diet.  Medicine.  Surgery. Follow these instructions at home: Eating and drinking   Follow a diet as told by your doctor. You may need to avoid foods and drinks such as: ? Coffee and tea (with or without caffeine). ? Drinks that contain alcohol. ? Energy drinks and sports drinks. ? Bubbly (carbonated) drinks or sodas. ? Chocolate and cocoa. ? Peppermint and mint flavorings. ? Garlic and onions. ? Horseradish. ? Spicy and acidic foods. These include peppers, chili powder, curry powder, vinegar, hot sauces, and BBQ sauce. ? Citrus fruit juices and citrus fruits, such as oranges, lemons, and limes. ? Tomato-based foods. These include red sauce, chili, salsa, and pizza with red sauce. ? Fried and fatty foods. These include donuts, french fries, potato chips, and high-fat dressings. ? High-fat meats. These include hot dogs, rib eye steak, sausage, ham, and bacon. ? High-fat dairy items, such as whole milk,  butter, and cream cheese.  Eat small meals often. Avoid eating large meals.  Avoid drinking large amounts of liquid with your meals.  Avoid eating meals during the 2-3 hours before bedtime.  Avoid lying down right after you eat.  Do not exercise right after you eat. Lifestyle   Do not use any products that contain nicotine or tobacco. These include  cigarettes, e-cigarettes, and chewing tobacco. If you need help quitting, ask your doctor.  Try to lower your stress. If you need help doing this, ask your doctor.  If you are overweight, lose an amount of weight that is healthy for you. Ask your doctor about a safe weight loss goal. General instructions  Pay attention to any changes in your symptoms.  Take over-the-counter and prescription medicines only as told by your doctor. Do not take aspirin, ibuprofen, or other NSAIDs unless your doctor says it is okay.  Wear loose clothes. Do not wear anything tight around your waist.  Raise (elevate) the head of your bed about 6 inches (15 cm).  Avoid bending over if this makes your symptoms worse.  Keep all follow-up visits as told by your doctor. This is important. Contact a doctor if:  You have new symptoms.  You lose weight and you do not know why.  You have trouble swallowing or it hurts to swallow.  You have wheezing or a cough that keeps happening.  Your symptoms do not get better with treatment.  You have a hoarse voice. Get help right away if:  You have pain in your arms, neck, jaw, teeth, or back.  You feel sweaty, dizzy, or light-headed.  You have chest pain or shortness of breath.  You throw up (vomit) and your throw-up looks like blood or coffee grounds.  You pass out (faint).  Your poop (stool) is bloody or black.  You cannot swallow, drink, or eat. Summary  If a person has gastroesophageal reflux disease (GERD), food and stomach acid move back up into the esophagus and cause symptoms or problems such as damage to the esophagus.  Treatment will depend on how bad your symptoms are.  Follow a diet as told by your doctor.  Take all medicines only as told by your doctor. This information is not intended to replace advice given to you by your health care provider. Make sure you discuss any questions you have with your health care provider. Document Revised:  08/14/2017 Document Reviewed: 08/14/2017 Elsevier Patient Education  2020 [place] for Gastroesophageal Reflux Disease, Adult When you have gastroesophageal reflux disease (GERD), the foods you eat and your eating habits are very important. Choosing the right foods can help ease your discomfort. Think about working with a nutrition specialist (dietitian) to help you make good choices. What are tips for following this plan?  Meals  Choose healthy foods that are low in fat, such as fruits, vegetables, whole grains, low-fat dairy products, and lean meat, fish, and poultry.  Eat small meals often instead of 3 large meals a day. Eat your meals slowly, and in a place where you are relaxed. Avoid bending over or lying down until 2-3 hours after eating.  Avoid eating meals 2-3 hours before bed.  Avoid drinking a lot of liquid with meals.  Cook foods using methods other than frying. Bake, grill, or broil food instead.  Avoid or limit: ? Chocolate. ? Peppermint or spearmint. ? Alcohol. ? Pepper. ? Black and decaffeinated coffee. ? Black and decaffeinated tea. ?  Bubbly (carbonated) soft drinks. ? Caffeinated energy drinks and soft drinks.  Limit high-fat foods such as: ? Fatty meat or fried foods. ? Whole milk, cream, butter, or ice cream. ? Nuts and nut butters. ? Pastries, donuts, and sweets made with butter or shortening.  Avoid foods that cause symptoms. These foods may be different for everyone. Common foods that cause symptoms include: ? Tomatoes. ? Oranges, lemons, and limes. ? Peppers. ? Spicy food. ? Onions and garlic. ? Vinegar. Lifestyle  Maintain a healthy weight. Ask your doctor what weight is healthy for you. If you need to lose weight, work with your doctor to do so safely.  Exercise for at least 30 minutes for 5 or more days each week, or as told by your doctor.  Wear loose-fitting clothes.  Do not smoke. If you need help quitting, ask your  doctor.  Sleep with the head of your bed higher than your feet. Use a wedge under the mattress or blocks under the bed frame to raise the head of the bed. Summary  When you have gastroesophageal reflux disease (GERD), food and lifestyle choices are very important in easing your symptoms.  Eat small meals often instead of 3 large meals a day. Eat your meals slowly, and in a place where you are relaxed.  Limit high-fat foods such as fatty meat or fried foods.  Avoid bending over or lying down until 2-3 hours after eating.  Avoid peppermint and spearmint, caffeine, alcohol, and chocolate. This information is not intended to replace advice given to you by your health care provider. Make sure you discuss any questions you have with your health care provider. Document Revised: 05/29/2018 Document Reviewed: 03/13/2016 Elsevier Patient Education  [employer].  Due to recent changes in healthcare laws, you may see the results of your imaging and laboratory studies on MyChart before your provider has had a chance to review them.  We understand that in some cases there may be results that are confusing or concerning to you. Not all laboratory results come back in the same time frame and the provider may be waiting for multiple results in order to interpret others.  Please give [place] 48 hours in order for your provider to thoroughly review all the results before contacting the office for clarification of your results.   Thank you for choosing [facility] Noralyn Pick, CRNP

## 2019-09-11 ENCOUNTER — Telehealth: Payer: Self-pay | Admitting: Nurse Practitioner

## 2019-09-11 ENCOUNTER — Other Ambulatory Visit: Payer: Self-pay

## 2019-09-11 DIAGNOSIS — K219 Gastro-esophageal reflux disease without esophagitis: Secondary | ICD-10-CM

## 2019-09-11 NOTE — Telephone Encounter (Signed)
Spoke with Jonathon Chavez. The patient will be out of town next week, but can come in after that. Order in [place]. Lab hours explained.

## 2019-09-11 NOTE — Telephone Encounter (Signed)
Beth, can you contact the patient and let him know I did not find any recent CBC results in his records. Due to his rectal bleeding I would like him to have a current CBC. Pls  provide him with a CBC order and he can get it done at his convenience next week. thx

## 2019-09-15 ENCOUNTER — Other Ambulatory Visit: Payer: Self-pay

## 2019-09-23 ENCOUNTER — Other Ambulatory Visit (INDEPENDENT_AMBULATORY_CARE_PROVIDER_SITE_OTHER): Payer: PPO

## 2019-09-23 DIAGNOSIS — K219 Gastro-esophageal reflux disease without esophagitis: Secondary | ICD-10-CM

## 2019-09-23 LAB — CBC WITH DIFFERENTIAL/PLATELET
Basophils Absolute: 0 10*3/uL (ref 0.0–0.1)
Basophils Relative: 0.7 % (ref 0.0–3.0)
Eosinophils Absolute: 0 10*3/uL (ref 0.0–0.7)
Eosinophils Relative: 0.6 % (ref 0.0–5.0)
HCT: 40.4 % (ref 39.0–52.0)
Hemoglobin: 14 g/dL (ref 13.0–17.0)
Lymphocytes Relative: 16 % (ref 12.0–46.0)
Lymphs Abs: 0.8 10*3/uL (ref 0.7–4.0)
MCHC: 34.6 g/dL (ref 30.0–36.0)
MCV: 86.6 fl (ref 78.0–100.0)
Monocytes Absolute: 0.4 10*3/uL (ref 0.1–1.0)
Monocytes Relative: 6.9 % (ref 3.0–12.0)
Neutro Abs: 3.9 10*3/uL (ref 1.4–7.7)
Neutrophils Relative %: 75.8 % (ref 43.0–77.0)
Platelets: 145 10*3/uL — ABNORMAL LOW (ref 150.0–400.0)
RBC: 4.67 Mil/uL (ref 4.22–5.81)
RDW: 13.1 % (ref 11.5–15.5)
WBC: 5.1 10*3/uL (ref 4.0–10.5)

## 2019-09-29 NOTE — Progress Notes (Signed)
Addendum: Reviewed and agree with assessment and management plan. ,  M, MD  

## 2019-10-12 ENCOUNTER — Encounter: Payer: Self-pay | Admitting: Internal Medicine

## 2019-10-20 ENCOUNTER — Other Ambulatory Visit: Payer: Self-pay

## 2019-10-20 ENCOUNTER — Encounter: Payer: Self-pay | Admitting: Internal Medicine

## 2019-10-20 ENCOUNTER — Ambulatory Visit (AMBULATORY_SURGERY_CENTER): Payer: PPO | Admitting: Internal Medicine

## 2019-10-20 VITALS — BP 127/84 | HR 69 | Temp 97.8°F | Resp 18 | Ht 72.0 in | Wt 196.0 lb

## 2019-10-20 DIAGNOSIS — Z800 Family history of malignant neoplasm of digestive organs: Secondary | ICD-10-CM

## 2019-10-20 DIAGNOSIS — K635 Polyp of colon: Secondary | ICD-10-CM | POA: Diagnosis not present

## 2019-10-20 DIAGNOSIS — K2270 Barrett's esophagus without dysplasia: Secondary | ICD-10-CM

## 2019-10-20 DIAGNOSIS — D125 Benign neoplasm of sigmoid colon: Secondary | ICD-10-CM

## 2019-10-20 DIAGNOSIS — Z1211 Encounter for screening for malignant neoplasm of colon: Secondary | ICD-10-CM | POA: Diagnosis not present

## 2019-10-20 DIAGNOSIS — K449 Diaphragmatic hernia without obstruction or gangrene: Secondary | ICD-10-CM | POA: Diagnosis not present

## 2019-10-20 DIAGNOSIS — K219 Gastro-esophageal reflux disease without esophagitis: Secondary | ICD-10-CM | POA: Diagnosis not present

## 2019-10-20 DIAGNOSIS — D120 Benign neoplasm of cecum: Secondary | ICD-10-CM

## 2019-10-20 DIAGNOSIS — Z86010 Personal history of colonic polyps: Secondary | ICD-10-CM | POA: Diagnosis not present

## 2019-10-20 HISTORY — PX: COLONOSCOPY: SHX174

## 2019-10-20 HISTORY — PX: UPPER GASTROINTESTINAL ENDOSCOPY: SHX188

## 2019-10-20 MED ORDER — SODIUM CHLORIDE 0.9 % IV SOLN: 500.0000 mL | Freq: Once | INTRAVENOUS | Status: DC

## 2019-10-20 NOTE — Progress Notes (Signed)
Pt's states no medical or surgical changes since previsit or office visit.  ° °Vitals CW °

## 2019-10-20 NOTE — Progress Notes (Signed)
A and O x3. Report to RN. Tolerated MAC anesthesia well.Teeth unchanged after procedure.

## 2019-10-20 NOTE — Op Note (Signed)
[place] Patient Name: Jonathon Chavez Procedure Date: 10/20/2019 1:48 PM MRN: [id number] Endoscopist: Jerene Bears , MD Age: 74 Referring MD:  Date of Birth: 1945/07/28 Gender: Male Account #: [id number] Procedure:                Colonoscopy Indications:              High risk colon cancer surveillance: Personal                            history of sessile serrated colon polyp (less than                            10 mm in size) with no dysplasia, Last colonoscopy:                            February 2015 Medicines:                Monitored Anesthesia Care Procedure:                Pre-Anesthesia Assessment:                           - Prior to the procedure, a History and Physical                            was performed, and patient medications and                            allergies were reviewed. The patient's tolerance of                            previous anesthesia was also reviewed. The risks                            and benefits of the procedure and the sedation                            options and risks were discussed with the patient.                            All questions were answered, and informed consent                            was obtained. Prior Anticoagulants: The patient has                            taken no previous anticoagulant or antiplatelet                            agents. ASA Grade Assessment: II - A patient with                            mild systemic disease. After reviewing the risks  and benefits, the patient was deemed in                            satisfactory condition to undergo the procedure.                           After obtaining informed consent, the colonoscope                            was passed under direct vision. Throughout the                            procedure, the patient's blood pressure, pulse, and                            oxygen saturations were monitored continuously. The                             Colonoscope was introduced through the anus and                            advanced to the cecum, identified by appendiceal                            orifice and ileocecal valve. The colonoscopy was                            performed without difficulty. The patient tolerated                            the procedure well. The quality of the bowel                            preparation was good. The ileocecal valve,                            appendiceal orifice, and rectum were photographed. Scope In: 2:08:38 PM Scope Out: 2:24:11 PM Scope Withdrawal Time: 0 hours 12 minutes 39 seconds  Total Procedure Duration: 0 hours 15 minutes 33 seconds  Findings:                 Hemorrhoids were found on perianal exam.                           Two sessile polyps were found in the cecum. The                            polyps were 3 to 6 mm in size. These polyps were                            removed with a cold snare. Resection and retrieval                            were complete.  A 3 mm polyp was found in the sigmoid colon. The                            polyp was sessile. The polyp was removed with a                            cold snare. Resection and retrieval were complete.                           Many small and large-mouthed diverticula were found                            in the sigmoid colon, descending colon, ascending                            colon and cecum.                           External and internal hemorrhoids were found during                            retroflexion and during perianal exam. The                            hemorrhoids were Grade IV (internal hemorrhoids                            that prolapse and cannot be reduced manually). Complications:            No immediate complications. Estimated Blood Loss:     Estimated blood loss was minimal. Impression:               - Two 3 to 6 mm polyps in the cecum,  removed with a                            cold snare. Resected and retrieved.                           - One 3 mm polyp in the sigmoid colon, removed with                            a cold snare. Resected and retrieved.                           - Severe diverticulosis in the sigmoid colon, in                            the descending colon, in the ascending colon and in                            the cecum.                           - Small external and large, Grade  IV, internal                            hemorrhoids. Recommendation:           - Patient has a contact number available for                            emergencies. The signs and symptoms of potential                            delayed complications were discussed with the                            patient. Return to normal activities tomorrow.                            Written discharge instructions were provided to the                            patient.                           - Resume previous diet.                           - Continue present medications.                           - Await pathology results.                           - If hemorrhoids remain symptomatic surgical                            referral is recommended for hemorrhoidectomy as                            Grade IV hemorrhoids often do not improve with                            banding alone. Can try hydrocortisone suppository                            25 mg at bedtime x 3-5 night for symptomatic                            hemorrhoids.                           - Repeat colonoscopy is recommended. The                            colonoscopy date will be determined after pathology                            results from today's exam become available for  review. Jerene Bears, MD 10/20/2019 2:35:00 PM This report has been signed electronically.

## 2019-10-20 NOTE — Progress Notes (Signed)
Called to room to assist during endoscopic procedure.  Patient ID and intended procedure confirmed with present staff. Received instructions for my participation in the procedure from the performing physician.  

## 2019-10-20 NOTE — Op Note (Signed)
[facility] Patient Name: Jonathon Chavez Procedure Date: 10/20/2019 1:48 PM MRN: [id number] Endoscopist: Jerene Bears , MD Age: 74 Referring MD:  Date of Birth: 1945-12-25 Gender: Male Account #: [id number] Procedure:                Upper GI endoscopy Indications:              Gastro-esophageal reflux disease, Family history of                            esophageal cancer in brother, another brother with                            history of Barrett's esophagus Medicines:                Monitored Anesthesia Care Procedure:                Pre-Anesthesia Assessment:                           - Prior to the procedure, a History and Physical                            was performed, and patient medications and                            allergies were reviewed. The patient's tolerance of                            previous anesthesia was also reviewed. The risks                            and benefits of the procedure and the sedation                            options and risks were discussed with the patient.                            All questions were answered, and informed consent                            was obtained. Prior Anticoagulants: The patient has                            taken no previous anticoagulant or antiplatelet                            agents. ASA Grade Assessment: II - A patient with                            mild systemic disease. After reviewing the risks                            and benefits, the patient was deemed in  satisfactory condition to undergo the procedure.                           After obtaining informed consent, the endoscope was                            passed under direct vision. Throughout the                            procedure, the patient's blood pressure, pulse, and                            oxygen saturations were monitored continuously. The                            Endoscope was introduced  through the mouth, and                            advanced to the second part of duodenum. The upper                            GI endoscopy was accomplished without difficulty.                            The patient tolerated the procedure well. Scope In: Scope Out: Findings:                 The esophagus and gastroesophageal junction were                            examined with white light and narrow band imaging                            (NBI) from a forward view and retroflexed position.                            There were esophageal mucosal changes suggestive of                            short-segment Barrett's esophagus. These changes                            involved the mucosa at the upper extent of the                            gastric folds (40 cm from the incisors) extending                            to the Z-line (38 cm from the incisors).                            Circumferential salmon-colored mucosa was present  from 39 to 40 cm and three tongues of                            salmon-colored mucosa were present from 38 to 39                            cm. The maximum longitudinal extent of these                            esophageal mucosal changes was 2 cm in length.                            Mucosa was biopsied with a cold forceps for                            histology in a targeted manner at intervals of 1 cm                            from 38 to 40 cm from the incisors. A total of 2                            specimen bottles were sent to pathology.                           A 2 cm hiatal hernia was present.                           The entire examined stomach was normal.                           The examined duodenum was normal. Complications:            No immediate complications. Estimated Blood Loss:     Estimated blood loss was minimal. Impression:               - Esophageal mucosal changes suggestive of                             short-segment Barrett's esophagus. Biopsied.                           - 2 cm hiatal hernia (40-42 cm from the incisors).                           - Normal stomach.                           - Normal examined duodenum. Recommendation:           - Patient has a contact number available for                            emergencies. The signs and symptoms of potential  delayed complications were discussed with the                            patient. Return to normal activities tomorrow.                            Written discharge instructions were provided to the                            patient.                           - Resume previous diet.                           - Continue present medications including                            pantoprazole 40 mg daily (30 minutes before                            breakfast) and famotidine 20 mg at bedtime.                           - Await pathology results. Jerene Bears, MD 10/20/2019 2:29:38 PM This report has been signed electronically.

## 2019-10-20 NOTE — Patient Instructions (Signed)
HANDOUTS PROVIDED ON: Barrett's esophagus, Hiatal Hernia, Diverticulosis, Polyps and Hemorrhoids  The polyps removed/biopsies taken today have been sent for pathology.  The results can take 1-3 weeks to receive.  When your next colonoscopy should occur will be based on the pathology results.    You may resume your previous diet and medication schedule. Continue Pantoprazole and Pepcid as before.  Thank you for allowing [place] to care for you today!!!   YOU HAD AN ENDOSCOPIC PROCEDURE TODAY AT [facility]:   Refer to the procedure report that was given to you for any specific questions about what was found during the examination.  If the procedure report does not answer your questions, please call your gastroenterologist to clarify.  If you requested that your care partner not be given the details of your procedure findings, then the procedure report has been included in a sealed envelope for you to review at your convenience later.  YOU SHOULD EXPECT: Some feelings of bloating in the abdomen. Passage of more gas than usual.  Walking can help get rid of the air that was put into your GI tract during the procedure and reduce the bloating. If you had a lower endoscopy (such as a colonoscopy or flexible sigmoidoscopy) you may notice spotting of blood in your stool or on the toilet paper. If you underwent a bowel prep for your procedure, you may not have a normal bowel movement for a few days.  Please Note:  You might notice some irritation and congestion in your nose or some drainage.  This is from the oxygen used during your procedure.  There is no need for concern and it should clear up in a day or so.  SYMPTOMS TO REPORT IMMEDIATELY:   Following lower endoscopy (colonoscopy or flexible sigmoidoscopy):  Excessive amounts of blood in the stool  Significant tenderness or worsening of abdominal pains  Swelling of the abdomen that is new, acute  Fever of 100F or higher   Following  upper endoscopy (EGD)  Vomiting of blood or coffee ground material  New chest pain or pain under the shoulder blades  Painful or persistently difficult swallowing  New shortness of breath  Fever of 100F or higher  Black, tarry-looking stools  For urgent or emergent issues, a gastroenterologist can be reached at any hour by calling [phone]. Do not use MyChart messaging for urgent concerns.    DIET:  We do recommend a small meal at first, but then you may proceed to your regular diet.  Drink plenty of fluids but you should avoid alcoholic beverages for 24 hours.  ACTIVITY:  You should plan to take it easy for the rest of today and you should NOT DRIVE or use heavy machinery until tomorrow (because of the sedation medicines used during the test).    FOLLOW UP: Our staff will call the number listed on your records 48-72 hours following your procedure to check on you and address any questions or concerns that you may have regarding the information given to you following your procedure. If we do not reach you, we will leave a message.  We will attempt to reach you two times.  During this call, we will ask if you have developed any symptoms of COVID 19. If you develop any symptoms (ie: fever, flu-like symptoms, shortness of breath, cough etc.) before then, please call [phone].  If you test positive for Covid 19 in the 2 weeks post procedure, please call and report this information  to [place].    If any biopsies were taken you will be contacted by phone or by letter within the next 1-3 weeks.  Please call us at [phone] if you have not heard about the biopsies in 3 weeks.    SIGNATURES/CONFIDENTIALITY: You and/or your care partner have signed paperwork which will be entered into your electronic medical record.  These signatures attest to the fact that that the information above on your After Visit Summary has been reviewed and is understood.  Full responsibility of the confidentiality  of this discharge information lies with you and/or your care-partner.

## 2019-10-22 ENCOUNTER — Telehealth: Payer: Self-pay | Admitting: *Deleted

## 2019-10-22 NOTE — Telephone Encounter (Signed)
  Follow up Call-  Call back number 10/20/2019  Post procedure Call Back phone  # [phone]  Permission to leave phone message Yes  Some recent data might be hidden     Patient questions:  Do you have a fever, pain , or abdominal swelling? No. Pain Score  0 *  Have you tolerated food without any problems? Yes.    Have you been able to return to your normal activities? Yes.    Do you have any questions about your discharge instructions: Diet   No. Medications  No. Follow up visit  No.  Do you have questions or concerns about your Care? No.  Actions: * If pain score is 4 or above: No action needed, pain <4.  1. Have you developed a fever since your procedure? no  2.   Have you had an respiratory symptoms (SOB or cough) since your procedure? no  3.   Have you tested positive for COVID 19 since your procedure no  4.   Have you had any family members/close contacts diagnosed with the COVID 19 since your procedure?  no   If yes to any of these questions please route to Joylene John, RN and Joella Prince, RN

## 2019-10-22 NOTE — Telephone Encounter (Signed)
Message left

## 2019-10-23 ENCOUNTER — Encounter: Payer: Self-pay | Admitting: Internal Medicine

## 2019-11-25 ENCOUNTER — Other Ambulatory Visit: Payer: Self-pay | Admitting: Nurse Practitioner

## 2019-11-25 DIAGNOSIS — Z86010 Personal history of colonic polyps: Secondary | ICD-10-CM

## 2019-11-25 DIAGNOSIS — K219 Gastro-esophageal reflux disease without esophagitis: Secondary | ICD-10-CM

## 2019-11-25 DIAGNOSIS — Z800 Family history of malignant neoplasm of digestive organs: Secondary | ICD-10-CM

## 2019-12-28 ENCOUNTER — Other Ambulatory Visit: Payer: PPO

## 2019-12-28 ENCOUNTER — Ambulatory Visit: Payer: PPO

## 2019-12-31 ENCOUNTER — Encounter: Payer: PPO | Admitting: Family Medicine

## 2020-01-01 ENCOUNTER — Ambulatory Visit (INDEPENDENT_AMBULATORY_CARE_PROVIDER_SITE_OTHER): Payer: PPO

## 2020-01-01 DIAGNOSIS — Z0000 Encounter for general adult medical examination without abnormal findings: Secondary | ICD-10-CM | POA: Diagnosis not present

## 2020-01-01 NOTE — Progress Notes (Signed)
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

## 2020-01-01 NOTE — Patient Instructions (Signed)
[name] , Thank you for taking time to come for your Medicare Wellness Visit. I appreciate your ongoing commitment to your health goals. Please review the following plan we discussed and let me know if I can assist you in the future.   Screening recommendations/referrals: Colonoscopy: Up to date, completed 10/20/2019, due 09/2024 Recommended yearly ophthalmology/optometry visit for glaucoma screening and checkup Recommended yearly dental visit for hygiene and checkup  Vaccinations: Influenza vaccine: due, will get at upcoming physical  Pneumococcal vaccine: Completed series Tdap vaccine: Up to date, completed 02/06/2013, due 01/2023 Shingles vaccine: due, check with your insurance regarding coverage/cost if interested    Covid-19: Completed series  Advanced directives: Please bring a copy of your POA (Power of Jonathon Chavez) and/or Living Will to your next appointment.   Conditions/risks identified: hypertension, dyslipidemia  Next appointment: Follow up in one year for your annual wellness visit.   Preventive Care 27 Years and Older, Male Preventive care refers to lifestyle choices and visits with your health care provider that can promote health and wellness. What does preventive care include?  A yearly physical exam. This is also called an annual well check.  Dental exams once or twice a year.  Routine eye exams. Ask your health care provider how often you should have your eyes checked.  Personal lifestyle choices, including:  Daily care of your teeth and gums.  Regular physical activity.  Eating a healthy diet.  Avoiding tobacco and drug use.  Limiting alcohol use.  Practicing safe sex.  Taking low doses of aspirin every day.  Taking vitamin and mineral supplements as recommended by your health care provider. What happens during an annual well check? The services and screenings done by your health care provider during your annual well check will depend on your age,  overall health, lifestyle risk factors, and family history of disease. Counseling  Your health care provider may ask you questions about your:  Alcohol use.  Tobacco use.  Drug use.  Emotional well-being.  Home and relationship well-being.  Sexual activity.  Eating habits.  History of falls.  Memory and ability to understand (cognition).  Work and work Statistician. Screening  You may have the following tests or measurements:  Height, weight, and BMI.  Blood pressure.  Lipid and cholesterol levels. These may be checked every 5 years, or more frequently if you are over 45 years old.  Skin check.  Lung cancer screening. You may have this screening every year starting at age 49 if you have a 30-pack-year history of smoking and currently smoke or have quit within the past 15 years.  Fecal occult blood test (FOBT) of the stool. You may have this test every year starting at age 19.  Flexible sigmoidoscopy or colonoscopy. You may have a sigmoidoscopy every 5 years or a colonoscopy every 10 years starting at age 61.  Prostate cancer screening. Recommendations will vary depending on your family history and other risks.  Hepatitis C blood test.  Hepatitis B blood test.  Sexually transmitted disease (STD) testing.  Diabetes screening. This is done by checking your blood sugar (glucose) after you have not eaten for a while (fasting). You may have this done every 1-3 years.  Abdominal aortic aneurysm (AAA) screening. You may need this if you are a current or former smoker.  Osteoporosis. You may be screened starting at age 78 if you are at high risk. Talk with your health care provider about your test results, treatment options, and if necessary, the need  for more tests. Vaccines  Your health care provider may recommend certain vaccines, such as:  Influenza vaccine. This is recommended every year.  Tetanus, diphtheria, and acellular pertussis (Tdap, Td) vaccine. You may  need a Td booster every 10 years.  Zoster vaccine. You may need this after age 74.  Pneumococcal 13-valent conjugate (PCV13) vaccine. One dose is recommended after age 75.  Pneumococcal polysaccharide (PPSV23) vaccine. One dose is recommended after age 35. Talk to your health care provider about which screenings and vaccines you need and how often you need them. This information is not intended to replace advice given to you by your health care provider. Make sure you discuss any questions you have with your health care provider. Document Released: 03/04/2015 Document Revised: 10/26/2015 Document Reviewed: 12/07/2014 Elsevier Interactive Patient Education  2017 [place] Prevention in the Home Falls can cause injuries. They can happen to people of all ages. There are many things you can do to make your home safe and to help prevent falls. What can I do on the outside of my home?  Regularly fix the edges of walkways and driveways and fix any cracks.  Remove anything that might make you trip as you walk through a door, such as a raised step or threshold.  Trim any bushes or trees on the path to your home.  Use bright outdoor lighting.  Clear any walking paths of anything that might make someone trip, such as rocks or tools.  Regularly check to see if handrails are loose or broken. Make sure that both sides of any steps have handrails.  Any raised decks and porches should have guardrails on the edges.  Have any leaves, snow, or ice cleared regularly.  Use sand or salt on walking paths during winter.  Clean up any spills in your garage right away. This includes oil or grease spills. What can I do in the bathroom?  Use night lights.  Install grab bars by the toilet and in the tub and shower. Do not use towel bars as grab bars.  Use non-skid mats or decals in the tub or shower.  If you need to sit down in the shower, use a plastic, non-slip stool.  Keep the floor  dry. Clean up any water that spills on the floor as soon as it happens.  Remove soap buildup in the tub or shower regularly.  Attach bath mats securely with double-sided non-slip rug tape.  Do not have throw rugs and other things on the floor that can make you trip. What can I do in the bedroom?  Use night lights.  Make sure that you have a light by your bed that is easy to reach.  Do not use any sheets or blankets that are too big for your bed. They should not hang down onto the floor.  Have a firm chair that has side arms. You can use this for support while you get dressed.  Do not have throw rugs and other things on the floor that can make you trip. What can I do in the kitchen?  Clean up any spills right away.  Avoid walking on wet floors.  Keep items that you use a lot in easy-to-reach places.  If you need to reach something above you, use a strong step stool that has a grab bar.  Keep electrical cords out of the way.  Do not use floor polish or wax that makes floors slippery. If you must use wax, use  non-skid floor wax.  Do not have throw rugs and other things on the floor that can make you trip. What can I do with my stairs?  Do not leave any items on the stairs.  Make sure that there are handrails on both sides of the stairs and use them. Fix handrails that are broken or loose. Make sure that handrails are as long as the stairways.  Check any carpeting to make sure that it is firmly attached to the stairs. Fix any carpet that is loose or worn.  Avoid having throw rugs at the top or bottom of the stairs. If you do have throw rugs, attach them to the floor with carpet tape.  Make sure that you have a light switch at the top of the stairs and the bottom of the stairs. If you do not have them, ask someone to add them for you. What else can I do to help prevent falls?  Wear shoes that:  Do not have high heels.  Have rubber bottoms.  Are comfortable and fit you  well.  Are closed at the toe. Do not wear sandals.  If you use a stepladder:  Make sure that it is fully opened. Do not climb a closed stepladder.  Make sure that both sides of the stepladder are locked into place.  Ask someone to hold it for you, if possible.  Clearly mark and make sure that you can see:  Any grab bars or handrails.  First and last steps.  Where the edge of each step is.  Use tools that help you move around (mobility aids) if they are needed. These include:  Canes.  Walkers.  Scooters.  Crutches.  Turn on the lights when you go into a dark area. Replace any light bulbs as soon as they burn out.  Set up your furniture so you have a clear path. Avoid moving your furniture around.  If any of your floors are uneven, fix them.  If there are any pets around you, be aware of where they are.  Review your medicines with your doctor. Some medicines can make you feel dizzy. This can increase your chance of falling. Ask your doctor what other things that you can do to help prevent falls. This information is not intended to replace advice given to you by your health care provider. Make sure you discuss any questions you have with your health care provider. Document Released: 12/02/2008 Document Revised: 07/14/2015 Document Reviewed: 03/12/2014 Elsevier Interactive Patient Education  2017 [employer].

## 2020-01-01 NOTE — Progress Notes (Signed)
Subjective:   Jonathon Chavez is a 74 y.o. male who presents for Medicare Annual/Subsequent preventive examination.  Review of Systems: N/A      I connected with the patient today by telephone and verified that I am speaking with the correct person using two identifiers. Location patient: home Location nurse: work Persons participating in the telephone visit: patient, nurse.   I discussed the limitations, risks, security and privacy concerns of performing an evaluation and management service by telephone and the availability of in person appointments. I also discussed with the patient that there may be a patient responsible charge related to this service. The patient expressed understanding and verbally consented to this telephonic visit.        Cardiac Risk Factors include: advanced age (>33men, >43 women);hypertension;male gender;Other (see comment), Risk factor comments: dyslipidemia     Objective:    Today's Vitals   There is no height or weight on file to calculate BMI.  Advanced Directives 01/01/2020 08/29/2017 03/21/2017 02/08/2014  Does Patient Have a Medical Advance Directive? Yes No Yes No  Type of Paramedic of [place];Living will - [place];Living will -  Copy of [place] in Chart? No - copy requested - - -  Would patient like information on creating a medical advance directive? - No - Patient declined - Yes - Educational materials given    Current Medications (verified) Outpatient Encounter Medications as of 01/01/2020  Medication Sig  . Cholecalciferol (VITAMIN D3 PO) Take by mouth.  . losartan (COZAAR) 50 MG tablet Take 1 tablet (50 mg total) by mouth daily.  . Multiple Vitamins-Minerals (EMERGEN-C IMMUNE PLUS PO) Take by mouth daily as needed.  . pantoprazole (PROTONIX) 40 MG tablet TAKE ONE TABLET BY MOUTH DAILY  . vitamin B-12 (CYANOCOBALAMIN) 1000 MCG tablet Take 1,000 mcg by mouth daily.    No facility-administered encounter medications on file as of 01/01/2020.    Allergies (verified) Lisinopril   History: Past Medical History:  Diagnosis Date  . BPH (benign prostatic hypertrophy)   . GERD (gastroesophageal reflux disease)   . Hemorrhoids   . Hypertension    Past Surgical History:  Procedure Laterality Date  . CATARACT EXTRACTION Bilateral 2018   Dr Clent Jacks  . CHOLECYSTECTOMY  8/14   Dr Pat Patrick  . COLONOSCOPY  03/2013   sessile serrated polyp, diverticulosis, rpt 5 yrs (Pyrtle)  . COLONOSCOPY  10/20/2019  . UPPER GASTROINTESTINAL ENDOSCOPY  10/20/2019   Family History  Problem Relation Age of Onset  . Diabetes Maternal Grandmother   . Dementia Father        alz  . CAD Father 23       5v CABG  . AAA (abdominal aortic aneurysm) Father   . CVA Father 68  . Heart disease Father   . Cancer Mother        ?ovarian cancer  . Cancer Brother        esophageal cancer  . Esophageal cancer Brother   . Cancer Brother 44       prostate cancer  . Colon cancer Neg Hx   . Colon polyps Neg Hx   . Stomach cancer Neg Hx   . Rectal cancer Neg Hx    Social History   Socioeconomic History  . Marital status: Married    Spouse name: Not on file  . Number of children: 1  . Years of education: Not on file  . Highest education level: Not  on file  Occupational History  . Occupation: truck Education administrator: [employer]  Tobacco Use  . Smoking status: Former Smoker    Types: Cigarettes    Quit date: 03/25/1973    Years since quitting: 46.8  . Smokeless tobacco: Never Used  Vaping Use  . Vaping Use: Never used  Substance and Sexual Activity  . Alcohol use: No  . Drug use: No  . Sexual activity: Not on file  Other Topics Concern  . Not on file  Social History Narrative   Lives with wife.    Occupation: retired Administrator for [employer]   Activity: walks dog, physical work   Diet: good water, fruits/vegetables daily   Social Determinants of Adult nurse Strain: Low Risk   . Difficulty of Paying Living Expenses: Not hard at all  Food Insecurity: No Food Insecurity  . Worried About Charity fundraiser in the Last Year: Never true  . Ran Out of Food in the Last Year: Never true  Transportation Needs: No Transportation Needs  . Lack of Transportation (Medical): No  . Lack of Transportation (Non-Medical): No  Physical Activity: Sufficiently Active  . Days of Exercise per Week: 7 days  . Minutes of Exercise per Session: 30 min  Stress: No Stress Concern Present  . Feeling of Stress : Not at all  Social Connections:   . Frequency of Communication with Friends and Family: Not on file  . Frequency of Social Gatherings with Friends and Family: Not on file  . Attends Religious Services: Not on file  . Active Member of Clubs or Organizations: Not on file  . Attends Archivist Meetings: Not on file  . Marital Status: Not on file    Tobacco Counseling Counseling given: Not Answered   Clinical Intake:  Pre-visit preparation completed: Yes  Pain : No/denies pain     Nutritional Risks: None Diabetes: No  How often do you need to have someone help you when you read instructions, pamphlets, or other written materials from your doctor or pharmacy?: 1 - Never What is the last grade level you completed in school?: 2 years college  Diabetic: No Nutrition Risk Assessment:  Has the patient had any N/V/D within the last 2 months?  No  Does the patient have any non-healing wounds?  No  Has the patient had any unintentional weight loss or weight gain?  No   Diabetes:  Is the patient diabetic?  No  If diabetic, was a CBG obtained today?  N/A Did the patient bring in their glucometer from home?  N/A How often do you monitor your CBG's? N/A.   Financial Strains and Diabetes Management:  Are you having any financial strains with the device, your supplies or your medication? N/A.  Does the patient want to be  seen by Chronic Care Management for management of their diabetes?  N/A Would the patient like to be referred to a Nutritionist or for Diabetic Management?  N/A    Interpreter Needed?: No  Information entered by :: CJohnson, LPN   Activities of Daily Living In your present state of health, do you have any difficulty performing the following activities: 01/01/2020  Hearing? N  Vision? N  Difficulty concentrating or making decisions? N  Walking or climbing stairs? N  Dressing or bathing? N  Doing errands, shopping? N  Preparing Food and eating ? N  Using the Toilet? N  In the past six months,  have you accidently leaked urine? N  Do you have problems with loss of bowel control? N  Managing your Medications? N  Managing your Finances? N  Housekeeping or managing your Housekeeping? N  Some recent data might be hidden    Patient Care Team: Ria Bush, MD as PCP - General (Family Medicine)  Indicate any recent Medical Services you may have received from other than [facility] providers in the past year (date may be approximate).     Assessment:   This is a routine wellness examination for [place].  Hearing/Vision screen  Hearing Screening   125Hz  250Hz  500Hz  1000Hz  2000Hz  3000Hz  4000Hz  6000Hz  8000Hz   Right ear:           Left ear:           Vision Screening Comments: Patient gets annual eye exams   Dietary issues and exercise activities discussed: Current Exercise Habits: Home exercise routine, Type of exercise: walking, Time (Minutes): 30, Frequency (Times/Week): 7, Weekly Exercise (Minutes/Week): 210, Intensity: Moderate, Exercise limited by: None identified  Goals    . Increase physical activity     Starting 08/29/2017, I will continue to walk 30-60 minutes daily.     . Patient Stated     01/01/2020, I will continue to walk daily for about 30 minutes.       Depression Screen PHQ 2/9 Scores 01/01/2020 12/25/2018 08/29/2017 08/27/2016 02/08/2014 02/06/2013  PHQ - 2 Score  0 0 0 0 0 0  PHQ- 9 Score 0 - 0 - - -    Fall Risk Fall Risk  01/01/2020 09/15/2019 12/25/2018 08/29/2017 08/27/2016  Falls in the past year? 0 0 0 No No  Comment - Emmi Telephone Survey: data to providers prior to load - - -  Number falls in past yr: 0 - - - -  Injury with Fall? 0 - - - -  Risk for fall due to : Medication side effect - - - -  Follow up Falls evaluation completed;Falls prevention discussed - - - -    Any stairs in or around the home? Yes  If so, are there any without handrails? No  Home free of loose throw rugs in walkways, pet beds, electrical cords, etc? Yes  Adequate lighting in your home to reduce risk of falls? Yes   ASSISTIVE DEVICES UTILIZED TO PREVENT FALLS:  Life alert? No  Use of a cane, walker or w/c? No  Grab bars in the bathroom? No  Shower chair or bench in shower? No  Elevated toilet seat or a handicapped toilet? No   TIMED UP AND GO:  Was the test performed? N/A, telephone visit.    Cognitive Function: MMSE - Mini Mental State Exam 01/01/2020 08/29/2017  Orientation to time 5 5  Orientation to Place 5 5  Registration 3 3  Attention/ Calculation 5 0  Recall 3 3  Language- name 2 objects - 0  Language- repeat 1 1  Language- follow 3 step command - 3  Language- read & follow direction - 0  Write a sentence - 0  Copy design - 0  Total score - 20  Mini Cog  Mini-Cog screen was completed. Maximum score is 22. A value of 0 denotes this part of the MMSE was not completed or the patient failed this part of the Mini-Cog screening.       Immunizations Immunization History  Administered Date(s) Administered  . Fluad Quad(high Dose 65+) 12/25/2018  . Influenza Split 12/24/2012  . Influenza-Unspecified 11/30/2013,  11/08/2014, 12/05/2015, 11/22/2016  . PFIZER SARS-COV-2 Vaccination 07/27/2019, 09/22/2019  . Pneumococcal Conjugate-13 02/08/2014  . Pneumococcal Polysaccharide-23 02/06/2013  . Td 02/20/2003  . Tdap 02/06/2013  . Zoster  08/28/2010    TDAP status: Up to date Flu Vaccine status: due, will get at upcoming physical Pneumococcal vaccine status: Up to date Covid-19 vaccine status: Completed vaccines  Qualifies for Shingles Vaccine? Yes   Zostavax completed Yes   Shingrix Completed?: No.    Education has been provided regarding the importance of this vaccine. Patient has been advised to call insurance company to determine out of pocket expense if they have not yet received this vaccine. Advised may also receive vaccine at local pharmacy or Health Dept. Verbalized acceptance and understanding.  Screening Tests Health Maintenance  Topic Date Due  . INFLUENZA VACCINE  09/20/2019  . TETANUS/TDAP  02/07/2023  . COLONOSCOPY  10/19/2024  . COVID-19 Vaccine  Completed  . Hepatitis C Screening  Completed  . PNA vac Low Risk Adult  Completed    Health Maintenance  Health Maintenance Due  Topic Date Due  . INFLUENZA VACCINE  09/20/2019    Colorectal cancer screening: Completed 10/20/2019. Repeat every 5 years  Lung Cancer Screening: (Low Dose CT Chest recommended if Age 66-80 years, 30 pack-year currently smoking OR have quit w/in 15 years.) does not qualify.    Additional Screening:  Hepatitis C Screening: does qualify; Completed 08/29/2017  Vision Screening: Recommended annual ophthalmology exams for early detection of glaucoma and other disorders of the eye. Is the patient up to date with their annual eye exam?  Yes  Who is the provider or what is the name of the office in which the patient attends annual eye exams? Dr. Katy Fitch, Dr. Zadie Rhine  If pt is not established with a provider, would they like to be referred to a provider to establish care? No .   Dental Screening: Recommended annual dental exams for proper oral hygiene  Community Resource Referral / Chronic Care Management: CRR required this visit?  No   CCM required this visit?  No      Plan:     I have personally reviewed and noted the  following in the patient's chart:   . Medical and social history . Use of alcohol, tobacco or illicit drugs  . Current medications and supplements . Functional ability and status . Nutritional status . Physical activity . Advanced directives . List of other physicians . Hospitalizations, surgeries, and ER visits in previous 12 months . Vitals . Screenings to include cognitive, depression, and falls . Referrals and appointments  In addition, I have reviewed and discussed with patient certain preventive protocols, quality metrics, and best practice recommendations. A written personalized care plan for preventive services as well as general preventive health recommendations were provided to patient.   Due to this being a telephonic visit, the after visit summary with patients personalized plan was offered to patient via office or my-chart. Patient preferred to pick up at office at next visit or via mychart.   Andrez Grime, LPN   28/00/3491

## 2020-01-23 ENCOUNTER — Other Ambulatory Visit: Payer: Self-pay | Admitting: Family Medicine

## 2020-01-23 DIAGNOSIS — R739 Hyperglycemia, unspecified: Secondary | ICD-10-CM

## 2020-01-23 DIAGNOSIS — D696 Thrombocytopenia, unspecified: Secondary | ICD-10-CM

## 2020-01-23 DIAGNOSIS — N401 Enlarged prostate with lower urinary tract symptoms: Secondary | ICD-10-CM

## 2020-01-23 DIAGNOSIS — R350 Frequency of micturition: Secondary | ICD-10-CM

## 2020-01-23 DIAGNOSIS — E785 Hyperlipidemia, unspecified: Secondary | ICD-10-CM

## 2020-01-23 NOTE — Addendum Note (Signed)
Addended by: Ria Bush on: 01/23/2020 10:18 PM   Modules accepted: Orders

## 2020-01-26 ENCOUNTER — Other Ambulatory Visit (INDEPENDENT_AMBULATORY_CARE_PROVIDER_SITE_OTHER): Payer: PPO

## 2020-01-26 ENCOUNTER — Other Ambulatory Visit: Payer: Self-pay

## 2020-01-26 DIAGNOSIS — R350 Frequency of micturition: Secondary | ICD-10-CM

## 2020-01-26 DIAGNOSIS — R739 Hyperglycemia, unspecified: Secondary | ICD-10-CM | POA: Diagnosis not present

## 2020-01-26 DIAGNOSIS — E785 Hyperlipidemia, unspecified: Secondary | ICD-10-CM | POA: Diagnosis not present

## 2020-01-26 DIAGNOSIS — D696 Thrombocytopenia, unspecified: Secondary | ICD-10-CM | POA: Diagnosis not present

## 2020-01-26 DIAGNOSIS — N401 Enlarged prostate with lower urinary tract symptoms: Secondary | ICD-10-CM

## 2020-01-26 LAB — CBC WITH DIFFERENTIAL/PLATELET
Basophils Absolute: 0 10*3/uL (ref 0.0–0.1)
Basophils Relative: 0.7 % (ref 0.0–3.0)
Eosinophils Absolute: 0.1 10*3/uL (ref 0.0–0.7)
Eosinophils Relative: 2.7 % (ref 0.0–5.0)
HCT: 41.7 % (ref 39.0–52.0)
Hemoglobin: 14.3 g/dL (ref 13.0–17.0)
Lymphocytes Relative: 33 % (ref 12.0–46.0)
Lymphs Abs: 1.6 10*3/uL (ref 0.7–4.0)
MCHC: 34.2 g/dL (ref 30.0–36.0)
MCV: 85.3 fl (ref 78.0–100.0)
Monocytes Absolute: 0.4 10*3/uL (ref 0.1–1.0)
Monocytes Relative: 8.9 % (ref 3.0–12.0)
Neutro Abs: 2.7 10*3/uL (ref 1.4–7.7)
Neutrophils Relative %: 54.7 % (ref 43.0–77.0)
Platelets: 169 10*3/uL (ref 150.0–400.0)
RBC: 4.89 Mil/uL (ref 4.22–5.81)
RDW: 13.3 % (ref 11.5–15.5)
WBC: 4.9 10*3/uL (ref 4.0–10.5)

## 2020-01-26 LAB — COMPREHENSIVE METABOLIC PANEL
ALT: 24 U/L (ref 0–53)
AST: 21 U/L (ref 0–37)
Albumin: 4.3 g/dL (ref 3.5–5.2)
Alkaline Phosphatase: 39 U/L (ref 39–117)
BUN: 24 mg/dL — ABNORMAL HIGH (ref 6–23)
CO2: 31 mEq/L (ref 19–32)
Calcium: 9.5 mg/dL (ref 8.4–10.5)
Chloride: 103 mEq/L (ref 96–112)
Creatinine, Ser: 1.17 mg/dL (ref 0.40–1.50)
GFR: 62.15 mL/min (ref >60.00)
Glucose, Bld: 117 mg/dL — ABNORMAL HIGH (ref 70–99)
Potassium: 4.5 mEq/L (ref 3.5–5.1)
Sodium: 139 mEq/L (ref 135–145)
Total Bilirubin: 1 mg/dL (ref 0.2–1.2)
Total Protein: 7.2 g/dL (ref 6.0–8.3)

## 2020-01-26 LAB — LIPID PANEL
Cholesterol: 169 mg/dL (ref 0–200)
HDL: 31.2 mg/dL — ABNORMAL LOW (ref >39.00)
NonHDL: 138.18
Total CHOL/HDL Ratio: 5
Triglycerides: 203 mg/dL — ABNORMAL HIGH (ref 0.0–149.0)
VLDL: 40.6 mg/dL — ABNORMAL HIGH (ref 0.0–40.0)

## 2020-01-26 LAB — PSA: PSA: 3.15 ng/mL (ref 0.10–4.00)

## 2020-01-26 LAB — LDL CHOLESTEROL, DIRECT: Direct LDL: 112 mg/dL

## 2020-01-26 LAB — HEMOGLOBIN A1C: Hgb A1c MFr Bld: 6.2 % (ref 4.6–6.5)

## 2020-02-02 ENCOUNTER — Encounter: Payer: Self-pay | Admitting: Family Medicine

## 2020-02-02 ENCOUNTER — Ambulatory Visit (INDEPENDENT_AMBULATORY_CARE_PROVIDER_SITE_OTHER): Payer: PPO | Admitting: Family Medicine

## 2020-02-02 ENCOUNTER — Other Ambulatory Visit: Payer: Self-pay

## 2020-02-02 VITALS — BP 130/76 | HR 70 | Temp 98.2°F | Ht 72.0 in | Wt 195.0 lb

## 2020-02-02 DIAGNOSIS — N401 Enlarged prostate with lower urinary tract symptoms: Secondary | ICD-10-CM

## 2020-02-02 DIAGNOSIS — Z7189 Other specified counseling: Secondary | ICD-10-CM | POA: Diagnosis not present

## 2020-02-02 DIAGNOSIS — E785 Hyperlipidemia, unspecified: Secondary | ICD-10-CM | POA: Diagnosis not present

## 2020-02-02 DIAGNOSIS — R3129 Other microscopic hematuria: Secondary | ICD-10-CM | POA: Diagnosis not present

## 2020-02-02 DIAGNOSIS — I10 Essential (primary) hypertension: Secondary | ICD-10-CM

## 2020-02-02 DIAGNOSIS — L989 Disorder of the skin and subcutaneous tissue, unspecified: Secondary | ICD-10-CM

## 2020-02-02 DIAGNOSIS — L80 Vitiligo: Secondary | ICD-10-CM

## 2020-02-02 DIAGNOSIS — K219 Gastro-esophageal reflux disease without esophagitis: Secondary | ICD-10-CM

## 2020-02-02 DIAGNOSIS — Z0000 Encounter for general adult medical examination without abnormal findings: Secondary | ICD-10-CM | POA: Diagnosis not present

## 2020-02-02 DIAGNOSIS — K2270 Barrett's esophagus without dysplasia: Secondary | ICD-10-CM | POA: Insufficient documentation

## 2020-02-02 DIAGNOSIS — R351 Nocturia: Secondary | ICD-10-CM

## 2020-02-02 LAB — POC URINALSYSI DIPSTICK (AUTOMATED)
Bilirubin, UA: NEGATIVE
Glucose, UA: NEGATIVE
Ketones, UA: NEGATIVE
Leukocytes, UA: NEGATIVE
Nitrite, UA: NEGATIVE
Protein, UA: NEGATIVE
Spec Grav, UA: 1.025 (ref 1.010–1.025)
Urobilinogen, UA: 0.2 E.U./dL
pH, UA: 5.5 (ref 5.0–8.0)

## 2020-02-02 MED ORDER — TAMSULOSIN HCL 0.4 MG PO CAPS: 0.4000 mg | ORAL_CAPSULE | Freq: Every day | ORAL | 3 refills | Status: DC

## 2020-02-02 NOTE — Progress Notes (Addendum)
Patient ID: Jonathon Chavez, male    DOB: 06-Nov-1945, 74 y.o.   MRN: [id number]  This visit was conducted in person.  BP 130/76 (BP Location: Right Arm, Patient Position: Sitting, Cuff Size: Large)   Pulse 70   Temp 98.2 F (36.8 C) (Temporal)   Ht 6' (1.829 m)   Wt 195 lb (88.5 kg)   SpO2 98%   BMI 26.45 kg/m    CC: CPE Subjective:   HPI: Jonathon Chavez is a 74 y.o. male presenting on 02/02/2020 for Annual Exam (Prt 2.  Pt accompanied by wife, [name]- temp 98.0.)   Saw health advisor last month for medicare wellness visit. Note reviewed.    No exam data present  Flowsheet Row Clinical Support from 01/01/2020 in [facility] [place]  PHQ-2 Total Score 0      Fall Risk  01/01/2020 09/15/2019 12/25/2018 08/29/2017 08/27/2016  Falls in the past year? 0 0 0 No No  Comment - Emmi Telephone Survey: data to providers prior to load - - -  Number falls in past yr: 0 - - - -  Injury with Fall? 0 - - - -  Risk for fall due to : Medication side effect - - - -  Follow up Falls evaluation completed;Falls prevention discussed - - - -    He did have some blood in urine during latest DOT physical.   COVID illness 02/2019 - sick for 3 weeks. Fully recovered.  L middle finger locking in place - present for 6 months  Preventative: COLONOSCOPY 10/20/2019 - TA, SSP, severe diverticulosis, grade IV hem, rpt 5 yrs (Pyrtle) - rec surgery, pt not currently interested in hemorrhoid treatment  UPPER GASTROINTESTINAL ENDOSCOPY 10/20/2019 - Barrett's, 2cm HH (Pyrtle)  Prostate cancer screening - yearly DRE/PSA (brother with prostate cancer), nocturia x2-3. Tried super beta prostate without benefit. Lung cancer screening - not eligible Flu shot yearly Tdap 2014 [place] 07/2019, 09/2019  Pneumovax 2014, prevnar 2015  zostavax- 2012 shingrix - discussed - to check at pharmacy Advanced directive discussion - No living will. Working on this. Wife would be health care  POA. Would accept resuscitation--no prolonged ventilation. Probably no tube feeds if cognitively unaware.  Seat belt use discussed. Sunscreen use and discussed. No changing moles on skin.  Ex smoker (quit 1975)  Alcohol - none Dentist - Q6 mo Eye exam - yearly - saw retinologist until released  Bowel - no constipation Bladder - no incontinence, notes weakening of stream  Lives with wife.  Occupation: truck Geophysicist/field seismologist for [employer] Activity: increased walking - walks dog, physical work  Diet: good water, fruits/vegetables daily     Relevant past medical, surgical, family and social history reviewed and updated as indicated. Interim medical history since our last visit reviewed. Allergies and medications reviewed and updated. Outpatient Medications Prior to Visit  Medication Sig Dispense Refill  . Cholecalciferol (VITAMIN D3 PO) Take by mouth.    . losartan (COZAAR) 50 MG tablet Take 1 tablet (50 mg total) by mouth daily. 90 tablet 3  . Multiple Vitamins-Minerals (EMERGEN-C IMMUNE PLUS PO) Take by mouth daily as needed.    . pantoprazole (PROTONIX) 40 MG tablet TAKE ONE TABLET BY MOUTH DAILY 30 tablet 1  . vitamin B-12 (CYANOCOBALAMIN) 1000 MCG tablet Take 1,000 mcg by mouth daily.     No facility-administered medications prior to visit.     Per HPI unless specifically indicated in ROS section below Review of Systems  Constitutional: Negative for activity change, appetite change, chills, fatigue, fever and unexpected weight change.  HENT: Negative for hearing loss.   Eyes: Negative for visual disturbance.  Respiratory: Negative for cough, chest tightness, shortness of breath and wheezing.   Cardiovascular: Negative for chest pain, palpitations and leg swelling.  Gastrointestinal: Negative for abdominal distention, abdominal pain, blood in stool, constipation, diarrhea, nausea and vomiting.  Genitourinary: Negative for difficulty urinating and hematuria.  Musculoskeletal:  Negative for arthralgias, myalgias and neck pain.  Skin: Negative for rash.  Neurological: Negative for dizziness, seizures, syncope and headaches.  Hematological: Negative for adenopathy. Does not bruise/bleed easily.  Psychiatric/Behavioral: Negative for dysphoric mood. The patient is not nervous/anxious.    Objective:  BP 130/76 (BP Location: Right Arm, Patient Position: Sitting, Cuff Size: Large)   Pulse 70   Temp 98.2 F (36.8 C) (Temporal)   Ht 6' (1.829 m)   Wt 195 lb (88.5 kg)   SpO2 98%   BMI 26.45 kg/m   Wt Readings from Last 3 Encounters:  02/02/20 195 lb (88.5 kg)  10/20/19 196 lb (88.9 kg)  09/09/19 196 lb 12.8 oz (89.3 kg)      Physical Exam Vitals and nursing note reviewed.  Constitutional:      General: He is not in acute distress.    Appearance: Normal appearance. He is well-developed and well-nourished. He is not ill-appearing.  HENT:     Head: Normocephalic and atraumatic.     Right Ear: Hearing, tympanic membrane, ear canal and external ear normal.     Left Ear: Hearing, tympanic membrane, ear canal and external ear normal.     Mouth/Throat:     Mouth: Oropharynx is clear and moist and mucous membranes are normal.     Pharynx: No posterior oropharyngeal edema.  Eyes:     General: No scleral icterus.    Extraocular Movements: Extraocular movements intact and EOM normal.     Conjunctiva/sclera: Conjunctivae normal.     Pupils: Pupils are equal, round, and reactive to light.  Neck:     Thyroid: No thyroid mass or thyromegaly.     Vascular: No carotid bruit.  Cardiovascular:     Rate and Rhythm: Normal rate and regular rhythm.     Pulses: Normal pulses and intact distal pulses.          Radial pulses are 2+ on the right side and 2+ on the left side.     Heart sounds: Normal heart sounds. No murmur heard.   Pulmonary:     Effort: Pulmonary effort is normal. No respiratory distress.     Breath sounds: Normal breath sounds. No wheezing, rhonchi or  rales.  Abdominal:     General: Abdomen is flat. Bowel sounds are normal. There is no distension.     Palpations: Abdomen is soft. There is no mass.     Tenderness: There is no abdominal tenderness. There is no guarding or rebound.     Hernia: No hernia is present.  Genitourinary:    Prostate: Enlarged (30gm). Not tender and no nodules present.     Rectum: Normal. No mass (chronic soft tissue mass midline proximal to anus), tenderness, anal fissure, external hemorrhoid or internal hemorrhoid. Normal anal tone.  Musculoskeletal:        General: No edema. Normal range of motion.     Cervical back: Normal range of motion and neck supple.     Right lower leg: No edema.     Left lower leg: No  edema.  Lymphadenopathy:     Cervical: No cervical adenopathy.  Skin:    General: Skin is warm and dry.     Findings: No rash.  Neurological:     General: No focal deficit present.     Mental Status: He is alert and oriented to person, place, and time.     Comments: CN grossly intact, station and gait intact  Psychiatric:        Mood and Affect: Mood and affect and mood normal.        Behavior: Behavior normal.        Thought Content: Thought content normal.        Judgment: Judgment normal.       Results for orders placed or performed in visit on 02/02/20  POCT Urinalysis Dipstick (Automated)  Result Value Ref Range   Color, UA yellow    Clarity, UA clear    Glucose, UA Negative Negative   Bilirubin, UA negative    Ketones, UA negative    Spec Grav, UA 1.025 1.010 - 1.025   Blood, UA 1+    pH, UA 5.5 5.0 - 8.0   Protein, UA Negative Negative   Urobilinogen, UA 0.2 0.2 or 1.0 E.U./dL   Nitrite, UA negative    Leukocytes, UA Negative Negative   Assessment & Plan:  This visit occurred during the SARS-CoV-2 public health emergency.  Safety protocols were in place, including screening questions prior to the visit, additional usage of staff PPE, and extensive cleaning of exam room while  observing appropriate contact time as indicated for disinfecting solutions.   Problem List Items Addressed This Visit    Vitiligo   Routine general medical examination at a health care facility - Primary    Preventative protocols reviewed and updated unless pt declined. Discussed healthy diet and lifestyle.       Microhematuria    Urine dip remains + blood at DOT physical.  Update urinalysis today - <1 RBC/hpf.       Relevant Orders   POCT Urinalysis Dipstick (Automated) (Completed)   Hypertension, essential    Chronic, stable. Continue losartan 50mg  daily.       GERD    Continues pantoprazole 40mg  daily.  Newly noted Barrett's (Pyrtle)      Dyslipidemia    Chronic, reviewed diet choices to affect improved lipid control.  The 10-year ASCVD risk score Mikey Bussing DC Brooke Bonito., et al., 2013) is: 27%   Values used to calculate the score:     Age: 19 years     Sex: Male     Is Non-Hispanic African American: No     Diabetic: No     Tobacco smoker: No     Systolic Blood Pressure: 191 mmHg     Is BP treated: Yes     HDL Cholesterol: 31.2 mg/dL     Total Cholesterol: 169 mg/dL       Benign skin growth   Benign prostatic hyperplasia    Benign exam. Discussed increasing BPH symptoms.  Trial flomax 0.4mg  nightly, update with effect.       Relevant Medications   tamsulosin (FLOMAX) 0.4 MG CAPS capsule   Barrett esophagus    Newly noted earlier this year (Pyrtle).  Continue daily PPI.       Advanced care planning/counseling discussion    Advanced directive discussion - No living will. Working on this. Wife would be health care POA. Would accept resuscitation--no prolonged ventilation. Probably no tube feeds if cognitively unaware.  Meds ordered this encounter  Medications  . tamsulosin (FLOMAX) 0.4 MG CAPS capsule    Sig: Take 1 capsule (0.4 mg total) by mouth daily.    Dispense:  30 capsule    Refill:  3   Orders Placed This Encounter  Procedures  . POCT  Urinalysis Dipstick (Automated)    Patient instructions: Urinalysis today  If interested, check with pharmacy about new 2 shot shingles series (shingrix).  Let me know if you'd like further evaluation of left trigger finger.  Trial flomax 0.4mg  nightly and let me know if helpful to refill 90 d supply.  Return as needed or in 1 year for next physical.   Follow up plan: Return in about 1 year (around 02/01/2021) for annual exam, prior fasting for blood work.  Ria Bush, MD

## 2020-02-02 NOTE — Assessment & Plan Note (Signed)
Preventative protocols reviewed and updated unless pt declined. Discussed healthy diet and lifestyle.  

## 2020-02-02 NOTE — Assessment & Plan Note (Addendum)
Continues pantoprazole 40mg  daily.  Newly noted Barrett's (Pyrtle)

## 2020-02-02 NOTE — Assessment & Plan Note (Addendum)
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  Update urinalysis today - <1 RBC/hpf.

## 2020-02-02 NOTE — Assessment & Plan Note (Addendum)
Chronic, reviewed diet choices to affect improved lipid control.  The 10-year ASCVD risk score Mikey Bussing DC Brooke Bonito., et al., 2013) is: 27%   Values used to calculate the score:     Age: 74 years     Sex: Male     Is Non-Hispanic African American: No     Diabetic: No     Tobacco smoker: No     Systolic Blood Pressure: 050 mmHg     Is BP treated: Yes     HDL Cholesterol: 31.2 mg/dL     Total Cholesterol: 169 mg/dL

## 2020-02-02 NOTE — Assessment & Plan Note (Signed)
Chronic, stable. Continue losartan 50mg daily.  

## 2020-02-02 NOTE — Assessment & Plan Note (Addendum)
Newly noted earlier this year (Pyrtle).  Continue daily PPI.

## 2020-02-02 NOTE — Assessment & Plan Note (Addendum)
Advanced directive discussion - No living will. Working on this. Wife would be health care POA. Would accept resuscitation--no prolonged ventilation. Probably no tube feeds if cognitively unaware.  

## 2020-02-02 NOTE — Patient Instructions (Addendum)
Urinalysis today  If interested, check with pharmacy about new 2 shot shingles series (shingrix).  Let me know if you'd like further evaluation of left trigger finger.  Trial flomax 0.4mg  nightly and let me know if helpful to refill 90 d supply.  Return as needed or in 1 year for next physical.   Health Maintenance After Age 72 After age 57, you are at a higher risk for certain long-term diseases and infections as well as injuries from falls. Falls are a major cause of broken bones and head injuries in people who are older than age 87. Getting regular preventive care can help to keep you healthy and well. Preventive care includes getting regular testing and making lifestyle changes as recommended by your health care provider. Talk with your health care provider about:  Which screenings and tests you should have. A screening is a test that checks for a disease when you have no symptoms.  A diet and exercise plan that is right for you. What should I know about screenings and tests to prevent falls? Screening and testing are the best ways to find a health problem early. Early diagnosis and treatment give you the best chance of managing medical conditions that are common after age 51. Certain conditions and lifestyle choices may make you more likely to have a fall. Your health care provider may recommend:  Regular vision checks. Poor vision and conditions such as cataracts can make you more likely to have a fall. If you wear glasses, make sure to get your prescription updated if your vision changes.  Medicine review. Work with your health care provider to regularly review all of the medicines you are taking, including over-the-counter medicines. Ask your health care provider about any side effects that may make you more likely to have a fall. Tell your health care provider if any medicines that you take make you feel dizzy or sleepy.  Osteoporosis screening. Osteoporosis is a condition that causes the  bones to get weaker. This can make the bones weak and cause them to break more easily.  Blood pressure screening. Blood pressure changes and medicines to control blood pressure can make you feel dizzy.  Strength and balance checks. Your health care provider may recommend certain tests to check your strength and balance while standing, walking, or changing positions.  Foot health exam. Foot pain and numbness, as well as not wearing proper footwear, can make you more likely to have a fall.  Depression screening. You may be more likely to have a fall if you have a fear of falling, feel emotionally low, or feel unable to do activities that you used to do.  Alcohol use screening. Using too much alcohol can affect your balance and may make you more likely to have a fall. What actions can I take to lower my risk of falls? General instructions  Talk with your health care provider about your risks for falling. Tell your health care provider if: ? You fall. Be sure to tell your health care provider about all falls, even ones that seem minor. ? You feel dizzy, sleepy, or off-balance.  Take over-the-counter and prescription medicines only as told by your health care provider. These include any supplements.  Eat a healthy diet and maintain a healthy weight. A healthy diet includes low-fat dairy products, low-fat (lean) meats, and fiber from whole grains, beans, and lots of fruits and vegetables. Home safety  Remove any tripping hazards, such as rugs, cords, and clutter.  Install  safety equipment such as grab bars in bathrooms and safety rails on stairs.  Keep rooms and walkways well-lit. Activity   Follow a regular exercise program to stay fit. This will help you maintain your balance. Ask your health care provider what types of exercise are appropriate for you.  If you need a cane or walker, use it as recommended by your health care provider.  Wear supportive shoes that have nonskid  soles. Lifestyle  Do not drink alcohol if your health care provider tells you not to drink.  If you drink alcohol, limit how much you have: ? 0-1 drink a day for women. ? 0-2 drinks a day for men.  Be aware of how much alcohol is in your drink. In the [place] one drink equals one typical bottle of beer (12 oz), one-half glass of wine (5 oz), or one shot of hard liquor (1 oz).  Do not use any products that contain nicotine or tobacco, such as cigarettes and e-cigarettes. If you need help quitting, ask your health care provider. Summary  Having a healthy lifestyle and getting preventive care can help to protect your health and wellness after age 3.  Screening and testing are the best way to find a health problem early and help you avoid having a fall. Early diagnosis and treatment give you the best chance for managing medical conditions that are more common for people who are older than age 10.  Falls are a major cause of broken bones and head injuries in people who are older than age 80. Take precautions to prevent a fall at home.  Work with your health care provider to learn what changes you can make to improve your health and wellness and to prevent falls. This information is not intended to replace advice given to you by your health care provider. Make sure you discuss any questions you have with your health care provider. Document Revised: 05/29/2018 Document Reviewed: 12/19/2016 Elsevier Patient Education  2020 [employer].

## 2020-02-02 NOTE — Assessment & Plan Note (Signed)
Benign exam. Discussed increasing BPH symptoms.  Trial flomax 0.4mg  nightly, update with effect.

## 2020-02-03 ENCOUNTER — Encounter: Payer: Self-pay | Admitting: Family Medicine

## 2020-02-03 ENCOUNTER — Other Ambulatory Visit: Payer: Self-pay | Admitting: Nurse Practitioner

## 2020-02-03 DIAGNOSIS — Z86010 Personal history of colonic polyps: Secondary | ICD-10-CM

## 2020-02-03 DIAGNOSIS — K219 Gastro-esophageal reflux disease without esophagitis: Secondary | ICD-10-CM

## 2020-02-03 DIAGNOSIS — Z800 Family history of malignant neoplasm of digestive organs: Secondary | ICD-10-CM

## 2020-02-03 NOTE — Telephone Encounter (Signed)
Pantoprazole prescribed by [facility].

## 2020-02-05 MED ORDER — PANTOPRAZOLE SODIUM 40 MG PO TBEC: 40.0000 mg | DELAYED_RELEASE_TABLET | Freq: Every day | ORAL | 0 refills | Status: DC

## 2020-02-11 ENCOUNTER — Other Ambulatory Visit: Payer: Self-pay | Admitting: Family Medicine

## 2020-02-11 DIAGNOSIS — R3129 Other microscopic hematuria: Secondary | ICD-10-CM

## 2020-02-11 NOTE — Telephone Encounter (Signed)
Replied via lab result section. 

## 2020-04-10 ENCOUNTER — Other Ambulatory Visit: Payer: Self-pay | Admitting: Family Medicine

## 2020-04-11 NOTE — Telephone Encounter (Signed)
Pharmacy requests refill on: Losartan 50 mg   LAST REFILL: 12/25/2018 (Q-90, R-3) LAST OV: 02/02/2020 NEXT OV: Not Scheduled  PHARMACY: [place], [place]

## 2020-04-18 DIAGNOSIS — H40013 Open angle with borderline findings, low risk, bilateral: Secondary | ICD-10-CM | POA: Diagnosis not present

## 2020-04-18 DIAGNOSIS — H18513 Endothelial corneal dystrophy, bilateral: Secondary | ICD-10-CM | POA: Diagnosis not present

## 2020-04-18 DIAGNOSIS — H26491 Other secondary cataract, right eye: Secondary | ICD-10-CM | POA: Diagnosis not present

## 2020-04-18 DIAGNOSIS — H33193 Other retinoschisis and retinal cysts, bilateral: Secondary | ICD-10-CM | POA: Diagnosis not present

## 2020-04-18 DIAGNOSIS — H43813 Vitreous degeneration, bilateral: Secondary | ICD-10-CM | POA: Diagnosis not present

## 2020-04-18 DIAGNOSIS — Z961 Presence of intraocular lens: Secondary | ICD-10-CM | POA: Diagnosis not present

## 2020-05-02 DIAGNOSIS — H26492 Other secondary cataract, left eye: Secondary | ICD-10-CM | POA: Diagnosis not present

## 2020-05-12 ENCOUNTER — Other Ambulatory Visit: Payer: Self-pay | Admitting: Internal Medicine

## 2020-05-12 DIAGNOSIS — Z86010 Personal history of colonic polyps: Secondary | ICD-10-CM

## 2020-05-12 DIAGNOSIS — K219 Gastro-esophageal reflux disease without esophagitis: Secondary | ICD-10-CM

## 2020-05-12 DIAGNOSIS — Z800 Family history of malignant neoplasm of digestive organs: Secondary | ICD-10-CM

## 2020-07-08 ENCOUNTER — Other Ambulatory Visit: Payer: Self-pay | Admitting: Family Medicine

## 2020-08-11 ENCOUNTER — Encounter (INDEPENDENT_AMBULATORY_CARE_PROVIDER_SITE_OTHER): Payer: PPO | Admitting: Ophthalmology

## 2020-09-07 ENCOUNTER — Other Ambulatory Visit: Payer: Self-pay

## 2020-09-07 ENCOUNTER — Encounter (INDEPENDENT_AMBULATORY_CARE_PROVIDER_SITE_OTHER): Payer: Self-pay | Admitting: Ophthalmology

## 2020-09-07 ENCOUNTER — Ambulatory Visit (INDEPENDENT_AMBULATORY_CARE_PROVIDER_SITE_OTHER): Payer: PPO | Admitting: Ophthalmology

## 2020-09-07 DIAGNOSIS — H33102 Unspecified retinoschisis, left eye: Secondary | ICD-10-CM

## 2020-09-07 DIAGNOSIS — H348322 Tributary (branch) retinal vein occlusion, left eye, stable: Secondary | ICD-10-CM | POA: Diagnosis not present

## 2020-09-07 NOTE — Progress Notes (Signed)
09/07/2020     CHIEF COMPLAINT Patient presents for No chief complaint on file.   HISTORY OF PRESENT ILLNESS: Jonathon Chavez is a 75 y.o. male who presents to the clinic today for:     Referring physician: Warden Fillers, MD [address] [place],  [place] [id number]  HISTORICAL INFORMATION:   Selected notes from the MEDICAL RECORD NUMBER    Lab Results  Component Value Date   HGBA1C 6.2 01/26/2020     CURRENT MEDICATIONS: No current outpatient medications on file. (Ophthalmic Drugs)   No current facility-administered medications for this visit. (Ophthalmic Drugs)   Current Outpatient Medications (Other)  Medication Sig   Cholecalciferol (VITAMIN D3 PO) Take by mouth.   losartan (COZAAR) 50 MG tablet TAKE ONE TABLET BY MOUTH DAILY   Multiple Vitamins-Minerals (EMERGEN-C IMMUNE PLUS PO) Take by mouth daily as needed.   pantoprazole (PROTONIX) 40 MG tablet TAKE 1 TABLET BY MOUTH DAILY   tamsulosin (FLOMAX) 0.4 MG CAPS capsule TAKE ONE CAPSULE BY MOUTH DAILY   vitamin B-12 (CYANOCOBALAMIN) 1000 MCG tablet Take 1,000 mcg by mouth daily.   No current facility-administered medications for this visit. (Other)      REVIEW OF SYSTEMS:    ALLERGIES Allergies  Allergen Reactions   Lisinopril Cough    Dry cough    PAST MEDICAL HISTORY Past Medical History:  Diagnosis Date   BPH (benign prostatic hypertrophy)    GERD (gastroesophageal reflux disease)    Hemorrhoids    Hypertension    Past Surgical History:  Procedure Laterality Date   CATARACT EXTRACTION Bilateral 2018   Dr Clent Jacks   CHOLECYSTECTOMY  8/14   Dr Pat Patrick   COLONOSCOPY  03/2013   sessile serrated polyp, diverticulosis, rpt 5 yrs (Pyrtle)   COLONOSCOPY  10/20/2019   TA, SSP, severe diverticulosis, grade IV hem (Pyrtle)   UPPER GASTROINTESTINAL ENDOSCOPY  10/20/2019   Barrett's, 2cm HH (Pyrtle)    FAMILY HISTORY Family History  Problem Relation Age of Onset   Diabetes  Maternal Grandmother    Dementia Father        alz   CAD Father 79       5v CABG   AAA (abdominal aortic aneurysm) Father    CVA Father 69   Heart disease Father    Cancer Mother        ?ovarian cancer   Cancer Brother        esophageal cancer   Esophageal cancer Brother    Cancer Brother 59       prostate cancer   Colon cancer Neg Hx    Colon polyps Neg Hx    Stomach cancer Neg Hx    Rectal cancer Neg Hx     SOCIAL HISTORY Social History   Tobacco Use   Smoking status: Former    Types: Cigarettes    Quit date: 03/25/1973    Years since quitting: 47.4   Smokeless tobacco: Never  Vaping Use   Vaping Use: Never used  Substance Use Topics   Alcohol use: No   Drug use: No         OPHTHALMIC EXAM:  Base Eye Exam     Visual Acuity (ETDRS)       Right Left   Dist [place] 20/25 +2 20/20         Tonometry (Tonopen, 3:35 PM)       Right Left   Pressure 15 18  Pupils       Pupils React APD   Right PERRL Brisk None   Left PERRL Brisk None         Visual Fields       Left Right    Full Full         Extraocular Movement       Right Left    Full, Ortho Full, Ortho         Neuro/Psych     Oriented x3: Yes   Mood/Affect: Normal         Dilation     Both eyes: 1.0% Mydriacyl, 2.5% Phenylephrine @ 3:35 PM           Slit Lamp and Fundus Exam     External Exam       Right Left   External Normal Normal         Slit Lamp Exam       Right Left   Lids/Lashes Normal Normal   Conjunctiva/Sclera White and quiet White and quiet   Cornea Clear Clear   Anterior Chamber Deep and quiet Deep and quiet   Iris Round and reactive Round and reactive   Lens Centered posterior chamber intraocular lens Centered posterior chamber intraocular lens   Anterior Vitreous Normal Normal         Fundus Exam       Right Left   Posterior Vitreous Normal Normal   Disc Normal Normal   C/D Ratio 0.1 0.1   Macula Normal Normal   Vessels  Normal Normal   Periphery Normal Retinoschisis, stable, no new findings            IMAGING AND PROCEDURES  Imaging and Procedures for 09/07/20  OCT, Retina - OU - Both Eyes       Right Eye Quality was good. Scan locations included subfoveal. Central Foveal Thickness: 294. Progression has been stable. Findings include normal observations.   Left Eye Quality was good. Scan locations included subfoveal. Central Foveal Thickness: 299. Findings include normal foveal contour.   Notes History of old branch retinal vein occlusion left eye, now compensated, no active disease.             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 Branch retinal vein occlusion of left eye, unspecified complication status  L84.5364 OCT, Retina - OU - Both Eyes    2. Left retinoschisis  H33.102       1.  No active maculopathy OU  2.  Peripheral retinal schisis left eye stable  3.  Patient and wife have some minor concerns about his eyelids.  Avastin to follow-up with Dr. Duanne Guess  Ophthalmic Meds Ordered this visit:  No orders of the defined types were placed in this encounter.      Return in about 2 years (around 09/08/2022) for DILATE OU, COLOR FP.  There are no Patient Instructions on file for this visit.   Explained the diagnoses, plan, and follow up with the patient and they expressed understanding.  Patient expressed understanding of the importance of proper follow up care.   Clent Demark  M.D. Diseases & Surgery of the Retina and Vitreous [facility] 09/07/20     Abbreviations: M myopia (nearsighted); A astigmatism; H hyperopia (farsighted); P presbyopia;  Mrx spectacle prescription;  CTL contact lenses; OD right  eye; OS left eye; OU both eyes  XT exotropia; ET esotropia; PEK punctate epithelial keratitis; PEE punctate epithelial erosions; DES dry eye syndrome; MGD meibomian gland dysfunction; ATs artificial tears; PFAT's preservative free artificial tears; [place] nuclear sclerotic cataract; PSC posterior subcapsular cataract; ERM epi-retinal membrane; PVD posterior vitreous detachment; RD retinal detachment; DM diabetes mellitus; DR diabetic retinopathy; NPDR non-proliferative diabetic retinopathy; PDR proliferative diabetic retinopathy; CSME clinically significant macular edema; DME diabetic macular edema; dbh dot blot hemorrhages; CWS cotton wool spot; POAG primary open angle glaucoma; C/D cup-to-disc ratio; HVF humphrey visual field; GVF goldmann visual field; OCT optical coherence tomography; IOP intraocular pressure; BRVO Branch retinal vein occlusion; CRVO central retinal vein occlusion; CRAO central retinal artery occlusion; BRAO branch retinal artery occlusion; RT retinal tear; SB scleral buckle; PPV pars plana vitrectomy; VH Vitreous hemorrhage; PRP panretinal laser photocoagulation; IVK intravitreal kenalog; VMT vitreomacular traction; MH Macular hole;  NVD neovascularization of the disc; NVE neovascularization elsewhere; AREDS age related eye disease study; ARMD age related macular degeneration; POAG primary open angle glaucoma; EBMD epithelial/anterior basement membrane dystrophy; ACIOL anterior chamber intraocular lens; IOL intraocular lens; PCIOL posterior chamber intraocular lens; Phaco/IOL phacoemulsification with intraocular lens placement; [place] photorefractive keratectomy; LASIK laser assisted in situ keratomileusis; HTN hypertension; DM diabetes mellitus; COPD chronic obstructive pulmonary disease

## 2020-09-07 NOTE — Assessment & Plan Note (Signed)
Likely old extra macular branch retinal vein occlusion inferotemporal, looks collateralized no ongoing hemorrhages

## 2020-09-07 NOTE — Assessment & Plan Note (Signed)
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

## 2020-09-18 ENCOUNTER — Other Ambulatory Visit: Payer: Self-pay | Admitting: Internal Medicine

## 2020-09-18 DIAGNOSIS — Z86010 Personal history of colonic polyps: Secondary | ICD-10-CM

## 2020-09-18 DIAGNOSIS — Z800 Family history of malignant neoplasm of digestive organs: Secondary | ICD-10-CM

## 2020-09-18 DIAGNOSIS — K219 Gastro-esophageal reflux disease without esophagitis: Secondary | ICD-10-CM

## 2020-10-29 ENCOUNTER — Encounter: Payer: Self-pay | Admitting: Family Medicine

## 2020-10-30 NOTE — Telephone Encounter (Signed)
He only has health advisor phone visit appt scheduled for then (for medicare wellness visit), will need to schedule CPE with me for after 02/01/2021 (1 yr from last CPE).

## 2020-10-31 NOTE — Telephone Encounter (Signed)
LMTCB to schedule a cpe after 02/01/21

## 2020-11-21 ENCOUNTER — Other Ambulatory Visit: Payer: Self-pay | Admitting: Family Medicine

## 2020-12-22 ENCOUNTER — Encounter: Payer: Self-pay | Admitting: Family Medicine

## 2021-01-02 NOTE — Progress Notes (Signed)
Subjective:   Jonathon Chavez is a 75 y.o. male who presents for Medicare Annual/Subsequent preventive examination.  I connected with Horris Latino today by telephone and verified that I am speaking with the correct person using two identifiers. Location patient: home Location provider: work Persons participating in the virtual visit: patient, Marine scientist.    I discussed the limitations, risks, security and privacy concerns of performing an evaluation and management service by telephone and the availability of in person appointments. I also discussed with the patient that there may be a patient responsible charge related to this service. The patient expressed understanding and verbally consented to this telephonic visit.    Interactive audio and video telecommunications were attempted between this provider and patient, however failed, due to patient having technical difficulties OR patient did not have access to video capability.  We continued and completed visit with audio only.  Some vital signs may be absent or patient reported.   Time Spent with patient on telephone encounter: 30 minutes  Review of Systems     Cardiac Risk Factors include: advanced age (>13men, >45 women);hypertension     Objective:    Today's Vitals   01/04/21 1038  Weight: 188 lb (85.3 kg)  Height: 6' (1.829 m)   Body mass index is 25.5 kg/m.  Advanced Directives 01/04/2021 01/01/2020 08/29/2017 03/21/2017 02/08/2014  Does Patient Have a Medical Advance Directive? Yes Yes No Yes No  Type of Paramedic of [place];Living will [place];Living will - [place];Living will -  Does patient want to make changes to medical advance directive? Yes (MAU/Ambulatory/Procedural Areas - Information given) - - - -  Copy of Healthcare Power of Attorney in Chart? - No - copy requested - - -  Would patient like information on creating a medical advance directive? -  - No - Patient declined - Yes - Educational materials given    Current Medications (verified) Outpatient Encounter Medications as of 01/04/2021  Medication Sig   losartan (COZAAR) 50 MG tablet TAKE ONE TABLET BY MOUTH DAILY   pantoprazole (PROTONIX) 40 MG tablet TAKE ONE TABLET BY MOUTH DAILY   Cholecalciferol (VITAMIN D3 PO) Take by mouth. (Patient not taking: Reported on 01/04/2021)   Multiple Vitamins-Minerals (EMERGEN-C IMMUNE PLUS PO) Take by mouth daily as needed. (Patient not taking: Reported on 01/04/2021)   tamsulosin (FLOMAX) 0.4 MG CAPS capsule TAKE ONE CAPSULE BY MOUTH DAILY (Patient not taking: Reported on 01/04/2021)   vitamin B-12 (CYANOCOBALAMIN) 1000 MCG tablet Take 1,000 mcg by mouth daily. (Patient not taking: Reported on 01/04/2021)   No facility-administered encounter medications on file as of 01/04/2021.    Allergies (verified) Lisinopril   History: Past Medical History:  Diagnosis Date   BPH (benign prostatic hypertrophy)    GERD (gastroesophageal reflux disease)    Hemorrhoids    Hypertension    Past Surgical History:  Procedure Laterality Date   CATARACT EXTRACTION Bilateral 2018   Dr Clent Jacks   CHOLECYSTECTOMY  8/14   Dr Pat Patrick   COLONOSCOPY  03/2013   sessile serrated polyp, diverticulosis, rpt 5 yrs ([facility])   COLONOSCOPY  10/20/2019   TA, SSP, severe diverticulosis, grade IV hem ([facility])   UPPER GASTROINTESTINAL ENDOSCOPY  10/20/2019   Barrett's, 2cm HH ([facility])   Family History  Problem Relation Age of Onset   Diabetes Maternal Grandmother    Dementia Father        alz   CAD Father 84  5v CABG   AAA (abdominal aortic aneurysm) Father    CVA Father 57   Heart disease Father    Cancer Mother        ?ovarian cancer   Cancer Brother        esophageal cancer   Esophageal cancer Brother    Cancer Brother 44       prostate cancer   Colon cancer Neg Hx    Colon polyps Neg Hx    Stomach cancer Neg Hx    Rectal cancer Neg Hx     Social History   Socioeconomic History   Marital status: Married    Spouse name: Not on file   Number of children: 1   Years of education: Not on file   Highest education level: Not on file  Occupational History   Occupation: truck driver    Employer: [employer]  Tobacco Use   Smoking status: Former    Types: Cigarettes    Quit date: 03/25/1973    Years since quitting: 47.8   Smokeless tobacco: Never  Vaping Use   Vaping Use: Never used  Substance and Sexual Activity   Alcohol use: No   Drug use: No   Sexual activity: Not on file  Other Topics Concern   Not on file  Social History Narrative   Lives with wife.    Occupation: retired Administrator for [employer]   Activity: walks dog, physical work   Diet: good water, fruits/vegetables daily   Social Determinants of Radio broadcast assistant Strain: Low Risk    Difficulty of Paying Living Expenses: Not hard at all  Food Insecurity: No Food Insecurity   Worried About Charity fundraiser in the Last Year: Never true   Arboriculturist in the Last Year: Never true  Transportation Needs: No Transportation Needs   Lack of Transportation (Medical): No   Lack of Transportation (Non-Medical): No  Physical Activity: Insufficiently Active   Days of Exercise per Week: 4 days   Minutes of Exercise per Session: 30 min  Stress: No Stress Concern Present   Feeling of Stress : Not at all  Social Connections: Socially Integrated   Frequency of Communication with Friends and Family: Twice a week   Frequency of Social Gatherings with Friends and Family: More than three times a week   Attends Religious Services: More than 4 times per year   Active Member of [employer] or Organizations: Yes   Attends Music therapist: More than 4 times per year   Marital Status: Married    Tobacco Counseling Counseling given: Not Answered   Clinical Intake:  Pre-visit preparation completed: Yes  Pain : No/denies pain      BMI - recorded: 25.5 Nutritional Status: BMI 25 -29 Overweight Nutritional Risks: None Diabetes: No  How often do you need to have someone help you when you read instructions, pamphlets, or other written materials from your doctor or pharmacy?: 1 - Never  Diabetic? No  Interpreter Needed?: No  Information entered by :: Orrin Brigham LPN   Activities of Daily Living In your present state of health, do you have any difficulty performing the following activities: 01/04/2021  Hearing? N  Vision? N  Difficulty concentrating or making decisions? N  Walking or climbing stairs? N  Dressing or bathing? N  Doing errands, shopping? N  Preparing Food and eating ? N  Using the Toilet? N  In the past six months, have you accidently  leaked urine? N  Do you have problems with loss of bowel control? N  Managing your Medications? N  Managing your Finances? N  Housekeeping or managing your Housekeeping? N  Some recent data might be hidden    Patient Care Team: Ria Bush, MD as PCP - General (Family Medicine)  Indicate any recent Medical Services you may have received from other than [facility] providers in the past year (date may be approximate).     Assessment:   This is a routine wellness examination for [place].  Hearing/Vision screen Hearing Screening - Comments:: No issues Vision Screening - Comments:: Last exam 07/2020, Dr. Schuyler Amor  Dietary issues and exercise activities discussed: Current Exercise Habits: Home exercise routine, Type of exercise: walking, Time (Minutes): 30, Frequency (Times/Week): 4, Weekly Exercise (Minutes/Week): 120, Intensity: Moderate   Goals Addressed             This Visit's Progress    Patient Stated       Would like to continue current lifestyle       Depression Screen PHQ 2/9 Scores 01/04/2021 01/01/2020 12/25/2018 08/29/2017 08/27/2016 02/08/2014 02/06/2013  PHQ - 2 Score 0 0 0 0 0 0 0  PHQ- 9 Score - 0 - 0 - - -    Fall Risk Fall Risk   01/04/2021 01/01/2020 09/15/2019 12/25/2018 08/29/2017  Falls in the past year? 0 0 0 0 No  Comment - - Emmi Telephone Survey: data to providers prior to load - -  Number falls in past yr: 0 0 - - -  Injury with Fall? 0 0 - - -  Risk for fall due to : No Fall Risks Medication side effect - - -  Follow up Falls prevention discussed Falls evaluation completed;Falls prevention discussed - - -    FALL RISK PREVENTION PERTAINING TO THE HOME:  Any stairs in or around the home? Yes  If so, are there any without handrails? No  Home free of loose throw rugs in walkways, pet beds, electrical cords, etc? Yes  Adequate lighting in your home to reduce risk of falls? Yes   ASSISTIVE DEVICES UTILIZED TO PREVENT FALLS:  Life alert? No  Use of a cane, walker or w/c? No  Grab bars in the bathroom? No  Shower chair or bench in shower? No  Elevated toilet seat or a handicapped toilet? No   TIMED UP AND GO:  Was the test performed? No , visit completed over the phone.   Cognitive Function: Normal cognitive status assessed by this Nurse Health Advisor. No abnormalities found.   MMSE - Mini Mental State Exam 01/01/2020 08/29/2017  Orientation to time 5 5  Orientation to Place 5 5  Registration 3 3  Attention/ Calculation 5 0  Recall 3 3  Language- name 2 objects - 0  Language- repeat 1 1  Language- follow 3 step command - 3  Language- read & follow direction - 0  Write a sentence - 0  Copy design - 0  Total score - 20        Immunizations Immunization History  Administered Date(s) Administered   Fluad Quad(high Dose 65+) 12/25/2018   Influenza Split 12/24/2012   Influenza-Unspecified 11/30/2013, 11/08/2014, 12/05/2015, 11/22/2016   PFIZER(Purple Top)SARS-COV-2 Vaccination 07/27/2019, 09/22/2019   Pneumococcal Conjugate-13 02/08/2014   Pneumococcal Polysaccharide-23 02/06/2013   Td 02/20/2003   Tdap 02/06/2013   Zoster, Live 08/28/2010    TDAP status: Up to date  Flu Vaccine  status: Due, Education has been provided regarding  the importance of this vaccine. Advised may receive this vaccine at local pharmacy or Health Dept. Aware to provide a copy of the vaccination record if obtained from local pharmacy or Health Dept. Verbalized acceptance and understanding.  Pneumococcal vaccine status: Up to date  Covid-19 vaccine status: Declined, Education has been provided regarding the importance of this vaccine but patient still declined. Advised may receive this vaccine at local pharmacy or Health Dept.or vaccine clinic. Aware to provide a copy of the vaccination record if obtained from local pharmacy or Health Dept. Verbalized acceptance and understanding.  Qualifies for Shingles Vaccine? Yes   Zostavax completed Yes   Shingrix Completed?: No.    Education has been provided regarding the importance of this vaccine. Patient has been advised to call insurance company to determine out of pocket expense if they have not yet received this vaccine. Advised may also receive vaccine at local pharmacy or Health Dept. Verbalized acceptance and understanding.  Screening Tests Health Maintenance  Topic Date Due   Zoster Vaccines- Shingrix (1 of 2) Never done   COVID-19 Vaccine (3 - Booster for Pfizer series) 11/17/2019   INFLUENZA VACCINE  09/19/2020   TETANUS/TDAP  02/07/2023   COLONOSCOPY (Pts 45-36yrs Insurance coverage will need to be confirmed)  10/19/2024   Pneumonia Vaccine 72+ Years old  Completed   Hepatitis C Screening  Completed   HPV VACCINES  Aged Out    Health Maintenance  Health Maintenance Due  Topic Date Due   Zoster Vaccines- Shingrix (1 of 2) Never done   COVID-19 Vaccine (3 - Booster for Pfizer series) 11/17/2019   INFLUENZA VACCINE  09/19/2020    Colorectal cancer screening: Type of screening: Colonoscopy. Completed 10/20/19. Repeat every 5 years  Lung Cancer Screening: (Low Dose CT Chest recommended if Age 58-80 years, 30 pack-year currently smoking OR  have quit w/in 15years.) does not qualify.     Additional Screening:  Hepatitis C Screening: does qualify; Completed 08/29/17  Vision Screening: Recommended annual ophthalmology exams for early detection of glaucoma and other disorders of the eye. Is the patient up to date with their annual eye exam?  Yes  Who is the provider or what is the name of the office in which the patient attends annual eye exams? Dr. Schuyler Amor   Dental Screening: Recommended annual dental exams for proper oral hygiene  Community Resource Referral / Chronic Care Management: CRR required this visit?  No   CCM required this visit?  No      Plan:     I have personally reviewed and noted the following in the patient's chart:   Medical and social history Use of alcohol, tobacco or illicit drugs  Current medications and supplements including opioid prescriptions. Patient is not currently taking opioid prescriptions. Functional ability and status Nutritional status Physical activity Advanced directives List of other physicians Hospitalizations, surgeries, and ER visits in previous 12 months Vitals Screenings to include cognitive, depression, and falls Referrals and appointments  In addition, I have reviewed and discussed with patient certain preventive protocols, quality metrics, and best practice recommendations. A written personalized care plan for preventive services as well as general preventive health recommendations were provided to patient.   Due to this being a telephonic visit, the after visit summary with patients personalized plan was offered to patient via mail or my-chart. Patient would like to access on my-chart.   Loma Messing, LPN  [id number]    Nurse Health Advisor  Nurse Notes: None

## 2021-01-04 ENCOUNTER — Ambulatory Visit (INDEPENDENT_AMBULATORY_CARE_PROVIDER_SITE_OTHER): Payer: PPO

## 2021-01-04 VITALS — Ht 72.0 in | Wt 188.0 lb

## 2021-01-04 DIAGNOSIS — Z0000 Encounter for general adult medical examination without abnormal findings: Secondary | ICD-10-CM

## 2021-01-04 NOTE — Patient Instructions (Addendum)
[name] , Thank you for taking time to complete your Medicare Wellness Visit. I appreciate your ongoing commitment to your health goals. Please review the following plan we discussed and let me know if I can assist you in the future.   Screening recommendations/referrals: Colonoscopy: up to date, completed  10/20/19, due 10/19/24 Recommended yearly ophthalmology/optometry visit for glaucoma screening and checkup Recommended yearly dental visit for hygiene and checkup  Vaccinations: Influenza vaccine: Due-May obtain vaccine at our office or your local pharmacy. Pneumococcal vaccine: up to date Tdap vaccine: up to date , completed 02/06/13, due 02/07/23 Shingles vaccine: Discuss with your local pharmacy if you change your mind Covid-19: Discuss with your local pharmacy   Advanced directives: Please bring a copy of Living Will and/or [place] for your chart.   Conditions/risks identified: see problem list  Next appointment: Follow up in one year for your annual wellness visit. 01/09/22 @ 11:00am  Preventive Care 65 Years and Older, Male Preventive care refers to lifestyle choices and visits with your health care provider that can promote health and wellness. What does preventive care include? A yearly physical exam. This is also called an annual well check. Dental exams once or twice a year. Routine eye exams. Ask your health care provider how often you should have your eyes checked. Personal lifestyle choices, including: Daily care of your teeth and gums. Regular physical activity. Eating a healthy diet. Avoiding tobacco and drug use. Limiting alcohol use. Practicing safe sex. Taking low doses of aspirin every day. Taking vitamin and mineral supplements as recommended by your health care provider. What happens during an annual well check? The services and screenings done by your health care provider during your annual well check will depend on your age,  overall health, lifestyle risk factors, and family history of disease. Counseling  Your health care provider may ask you questions about your: Alcohol use. Tobacco use. Drug use. Emotional well-being. Home and relationship well-being. Sexual activity. Eating habits. History of falls. Memory and ability to understand (cognition). Work and work Statistician. Screening  You may have the following tests or measurements: Height, weight, and BMI. Blood pressure. Lipid and cholesterol levels. These may be checked every 5 years, or more frequently if you are over 73 years old. Skin check. Lung cancer screening. You may have this screening every year starting at age 73 if you have a 30-pack-year history of smoking and currently smoke or have quit within the past 15 years. Fecal occult blood test (FOBT) of the stool. You may have this test every year starting at age 73. Flexible sigmoidoscopy or colonoscopy. You may have a sigmoidoscopy every 5 years or a colonoscopy every 10 years starting at age 73. Prostate cancer screening. Recommendations will vary depending on your family history and other risks. Hepatitis C blood test. Hepatitis B blood test. Sexually transmitted disease (STD) testing. Diabetes screening. This is done by checking your blood sugar (glucose) after you have not eaten for a while (fasting). You may have this done every 1-3 years. Abdominal aortic aneurysm (AAA) screening. You may need this if you are a current or former smoker. Osteoporosis. You may be screened starting at age 73 if you are at high risk. Talk with your health care provider about your test results, treatment options, and if necessary, the need for more tests. Vaccines  Your health care provider may recommend certain vaccines, such as: Influenza vaccine. This is recommended every year. Tetanus, diphtheria, and acellular pertussis (Tdap,  Td) vaccine. You may need a Td booster every 10 years. Zoster vaccine.  You may need this after age 25. Pneumococcal 13-valent conjugate (PCV13) vaccine. One dose is recommended after age 73. Pneumococcal polysaccharide (PPSV23) vaccine. One dose is recommended after age 73 Talk to your health care provider about which screenings and vaccines you need and how often you need them. This information is not intended to replace advice given to you by your health care provider. Make sure you discuss any questions you have with your health care provider. Document Released: 03/04/2015 Document Revised: 10/26/2015 Document Reviewed: 12/07/2014 Elsevier Interactive Patient Education  2017 [place] Prevention in the Home Falls can cause injuries. They can happen to people of all ages. There are many things you can do to make your home safe and to help prevent falls. What can I do on the outside of my home? Regularly fix the edges of walkways and driveways and fix any cracks. Remove anything that might make you trip as you walk through a door, such as a raised step or threshold. Trim any bushes or trees on the path to your home. Use bright outdoor lighting. Clear any walking paths of anything that might make someone trip, such as rocks or tools. Regularly check to see if handrails are loose or broken. Make sure that both sides of any steps have handrails. Any raised decks and porches should have guardrails on the edges. Have any leaves, snow, or ice cleared regularly. Use sand or salt on walking paths during winter. Clean up any spills in your garage right away. This includes oil or grease spills. What can I do in the bathroom? Use night lights. Install grab bars by the toilet and in the tub and shower. Do not use towel bars as grab bars. Use non-skid mats or decals in the tub or shower. If you need to sit down in the shower, use a plastic, non-slip stool. Keep the floor dry. Clean up any water that spills on the floor as soon as it happens. Remove soap  buildup in the tub or shower regularly. Attach bath mats securely with double-sided non-slip rug tape. Do not have throw rugs and other things on the floor that can make you trip. What can I do in the bedroom? Use night lights. Make sure that you have a light by your bed that is easy to reach. Do not use any sheets or blankets that are too big for your bed. They should not hang down onto the floor. Have a firm chair that has side arms. You can use this for support while you get dressed. Do not have throw rugs and other things on the floor that can make you trip. What can I do in the kitchen? Clean up any spills right away. Avoid walking on wet floors. Keep items that you use a lot in easy-to-reach places. If you need to reach something above you, use a strong step stool that has a grab bar. Keep electrical cords out of the way. Do not use floor polish or wax that makes floors slippery. If you must use wax, use non-skid floor wax. Do not have throw rugs and other things on the floor that can make you trip. What can I do with my stairs? Do not leave any items on the stairs. Make sure that there are handrails on both sides of the stairs and use them. Fix handrails that are broken or loose. Make sure that handrails are as  long as the stairways. Check any carpeting to make sure that it is firmly attached to the stairs. Fix any carpet that is loose or worn. Avoid having throw rugs at the top or bottom of the stairs. If you do have throw rugs, attach them to the floor with carpet tape. Make sure that you have a light switch at the top of the stairs and the bottom of the stairs. If you do not have them, ask someone to add them for you. What else can I do to help prevent falls? Wear shoes that: Do not have high heels. Have rubber bottoms. Are comfortable and fit you well. Are closed at the toe. Do not wear sandals. If you use a stepladder: Make sure that it is fully opened. Do not climb a closed  stepladder. Make sure that both sides of the stepladder are locked into place. Ask someone to hold it for you, if possible. Clearly mark and make sure that you can see: Any grab bars or handrails. First and last steps. Where the edge of each step is. Use tools that help you move around (mobility aids) if they are needed. These include: Canes. Walkers. Scooters. Crutches. Turn on the lights when you go into a dark area. Replace any light bulbs as soon as they burn out. Set up your furniture so you have a clear path. Avoid moving your furniture around. If any of your floors are uneven, fix them. If there are any pets around you, be aware of where they are. Review your medicines with your doctor. Some medicines can make you feel dizzy. This can increase your chance of falling. Ask your doctor what other things that you can do to help prevent falls. This information is not intended to replace advice given to you by your health care provider. Make sure you discuss any questions you have with your health care provider. Document Released: 12/02/2008 Document Revised: 07/14/2015 Document Reviewed: 03/12/2014 Elsevier Interactive Patient Education  2017 [employer].

## 2021-01-18 ENCOUNTER — Other Ambulatory Visit: Payer: Self-pay | Admitting: Internal Medicine

## 2021-01-18 DIAGNOSIS — Z86010 Personal history of colonic polyps: Secondary | ICD-10-CM

## 2021-01-18 DIAGNOSIS — K219 Gastro-esophageal reflux disease without esophagitis: Secondary | ICD-10-CM

## 2021-01-18 DIAGNOSIS — Z800 Family history of malignant neoplasm of digestive organs: Secondary | ICD-10-CM

## 2021-02-09 ENCOUNTER — Encounter: Payer: Self-pay | Admitting: Family Medicine

## 2021-02-09 ENCOUNTER — Other Ambulatory Visit: Payer: Self-pay | Admitting: Internal Medicine

## 2021-02-09 DIAGNOSIS — Z800 Family history of malignant neoplasm of digestive organs: Secondary | ICD-10-CM

## 2021-02-09 DIAGNOSIS — K219 Gastro-esophageal reflux disease without esophagitis: Secondary | ICD-10-CM

## 2021-02-09 DIAGNOSIS — Z86010 Personal history of colonic polyps: Secondary | ICD-10-CM

## 2021-02-10 NOTE — Telephone Encounter (Signed)
Pantoprazole Last rx:  09/19/20, #90 (by Dr. Hilarie Fredrickson) Last OV:  02/02/20, AWV prt 2 Next OV:  03/29/21,  AWV prt 2

## 2021-02-11 MED ORDER — PANTOPRAZOLE SODIUM 40 MG PO TBEC
40.0000 mg | DELAYED_RELEASE_TABLET | Freq: Every day | ORAL | 0 refills | Status: DC
Start: 2021-02-11 — End: 2021-05-22

## 2021-02-11 NOTE — Telephone Encounter (Signed)
ERx 

## 2021-02-24 ENCOUNTER — Other Ambulatory Visit: Payer: Self-pay | Admitting: Family Medicine

## 2021-03-08 ENCOUNTER — Other Ambulatory Visit (HOSPITAL_COMMUNITY): Payer: Self-pay

## 2021-03-13 ENCOUNTER — Telehealth: Payer: Self-pay | Admitting: Family Medicine

## 2021-03-18 ENCOUNTER — Other Ambulatory Visit: Payer: Self-pay | Admitting: Family Medicine

## 2021-03-18 DIAGNOSIS — R3129 Other microscopic hematuria: Secondary | ICD-10-CM

## 2021-03-18 DIAGNOSIS — D696 Thrombocytopenia, unspecified: Secondary | ICD-10-CM

## 2021-03-18 DIAGNOSIS — E785 Hyperlipidemia, unspecified: Secondary | ICD-10-CM

## 2021-03-18 DIAGNOSIS — N401 Enlarged prostate with lower urinary tract symptoms: Secondary | ICD-10-CM

## 2021-03-22 ENCOUNTER — Other Ambulatory Visit: Payer: PPO

## 2021-03-29 ENCOUNTER — Encounter: Payer: PPO | Admitting: Family Medicine

## 2021-05-22 ENCOUNTER — Other Ambulatory Visit (HOSPITAL_COMMUNITY): Payer: Self-pay

## 2021-05-22 ENCOUNTER — Other Ambulatory Visit: Payer: Self-pay | Admitting: Family Medicine

## 2021-05-22 DIAGNOSIS — Z86010 Personal history of colonic polyps: Secondary | ICD-10-CM

## 2021-05-22 DIAGNOSIS — Z800 Family history of malignant neoplasm of digestive organs: Secondary | ICD-10-CM

## 2021-05-22 DIAGNOSIS — K219 Gastro-esophageal reflux disease without esophagitis: Secondary | ICD-10-CM

## 2021-05-23 ENCOUNTER — Other Ambulatory Visit: Payer: Self-pay | Admitting: Family Medicine

## 2021-05-23 ENCOUNTER — Other Ambulatory Visit (HOSPITAL_COMMUNITY): Payer: Self-pay

## 2021-05-23 DIAGNOSIS — Z86010 Personal history of colonic polyps: Secondary | ICD-10-CM

## 2021-05-23 DIAGNOSIS — K219 Gastro-esophageal reflux disease without esophagitis: Secondary | ICD-10-CM

## 2021-05-23 DIAGNOSIS — Z800 Family history of malignant neoplasm of digestive organs: Secondary | ICD-10-CM

## 2021-05-23 MED ORDER — PANTOPRAZOLE SODIUM 40 MG PO TBEC
40.0000 mg | DELAYED_RELEASE_TABLET | Freq: Every day | ORAL | 0 refills | Status: DC
  Filled 2021-05-23: qty 90, 90d supply, fill #0

## 2021-06-02 ENCOUNTER — Encounter: Payer: Self-pay | Admitting: Family Medicine

## 2021-06-02 ENCOUNTER — Ambulatory Visit (INDEPENDENT_AMBULATORY_CARE_PROVIDER_SITE_OTHER): Payer: PPO | Admitting: Family Medicine

## 2021-06-02 VITALS — BP 118/68 | HR 64 | Ht 73.0 in | Wt 191.0 lb

## 2021-06-02 DIAGNOSIS — R1032 Left lower quadrant pain: Secondary | ICD-10-CM

## 2021-06-02 DIAGNOSIS — M25852 Other specified joint disorders, left hip: Secondary | ICD-10-CM | POA: Insufficient documentation

## 2021-06-02 LAB — CBC WITH DIFFERENTIAL/PLATELET
Basophils Absolute: 0 10*3/uL (ref 0.0–0.1)
Basophils Relative: 0.8 % (ref 0.0–3.0)
Eosinophils Absolute: 0.1 10*3/uL (ref 0.0–0.7)
Eosinophils Relative: 2.7 % (ref 0.0–5.0)
HCT: 39.9 % (ref 39.0–52.0)
Hemoglobin: 13.9 g/dL (ref 13.0–17.0)
Lymphocytes Relative: 29.8 % (ref 12.0–46.0)
Lymphs Abs: 1.3 10*3/uL (ref 0.7–4.0)
MCHC: 34.7 g/dL (ref 30.0–36.0)
MCV: 84.8 fl (ref 78.0–100.0)
Monocytes Absolute: 0.4 10*3/uL (ref 0.1–1.0)
Monocytes Relative: 8.6 % (ref 3.0–12.0)
Neutro Abs: 2.5 10*3/uL (ref 1.4–7.7)
Neutrophils Relative %: 58.1 % (ref 43.0–77.0)
Platelets: 174 10*3/uL (ref 150.0–400.0)
RBC: 4.7 Mil/uL (ref 4.22–5.81)
RDW: 13.1 % (ref 11.5–15.5)
WBC: 4.3 10*3/uL (ref 4.0–10.5)

## 2021-06-02 LAB — COMPREHENSIVE METABOLIC PANEL
ALT: 19 U/L (ref 0–53)
AST: 19 U/L (ref 0–37)
Albumin: 4.3 g/dL (ref 3.5–5.2)
Alkaline Phosphatase: 51 U/L (ref 39–117)
BUN: 25 mg/dL — ABNORMAL HIGH (ref 6–23)
CO2: 29 mEq/L (ref 19–32)
Calcium: 9.6 mg/dL (ref 8.4–10.5)
Chloride: 102 mEq/L (ref 96–112)
Creatinine, Ser: 0.94 mg/dL (ref 0.40–1.50)
GFR: 80.06 mL/min (ref >60.00)
Glucose, Bld: 118 mg/dL — ABNORMAL HIGH (ref 70–99)
Potassium: 4 mEq/L (ref 3.5–5.1)
Sodium: 137 mEq/L (ref 135–145)
Total Bilirubin: 0.8 mg/dL (ref 0.2–1.2)
Total Protein: 7.6 g/dL (ref 6.0–8.3)

## 2021-06-02 NOTE — Progress Notes (Signed)
? ? Patient ID: Jonathon Chavez, male    DOB: 03/15/1945, 76 y.o.   MRN: [id number] ? ?This visit was conducted in person. ? ?BP 118/68   Pulse 64   Ht 6' 1"  (1.854 m)   Wt 191 lb (86.6 kg)   SpO2 97%   BMI 25.20 kg/m?   ? ?CC:  ?Chief Complaint  ?Patient presents with  ? Abdominal Pain  ?  LLQ--groin area--sharp especially when stepping out the right foot or getting out of the chair--2 months. Tried  ibuprofen but  not help. Denied fever.  ? ? ?Subjective:  ? ?HPI: ?Jonathon Chavez is a 76 y.o. male patient of Dr. Synthia Innocent with history of HTN, GERD presenting on 06/02/2021 for Abdominal Pain (LLQ--groin area--sharp especially when stepping out the right foot or getting out of the chair--2 months. Tried  ibuprofen but  not help. Denied fever.) ? ? ?He reports new onset pain in last 2 months in left  grin... no real abdominal pain.. ? No proceeding event or change in activity. ?Pain  is sharp, triggered with movement, and standing up, lasts few moments and then continues as an ache as long he is standing on it. ? Improves if curling up on bed worse if stretching out. ?  ? No dysuria, no  blood in stool. ? Nml BMs for him.. daily to every few days. ? No fever. ? ?No change with eating, passing stool or urination. ? ? He is fairly active overall.. walking daily. ? ? Has tried ibuprofen, tylenol... no improvement. ?  2021 colonoscopy showed hemorrhoids, poylps and tics. ? ?Relevant past medical, surgical, family and social history reviewed and updated as indicated. Interim medical history since our last visit reviewed. ?Allergies and medications reviewed and updated. ?Outpatient Medications Prior to Visit  ?Medication Sig Dispense Refill  ? losartan (COZAAR) 50 MG tablet TAKE ONE TABLET BY MOUTH DAILY 90 tablet 0  ? Multiple Vitamins-Minerals (EMERGEN-C IMMUNE PLUS PO) Take by mouth daily as needed.    ? pantoprazole (PROTONIX) 40 MG tablet Take 1 tablet (40 mg total) by mouth daily. 90 tablet 0  ? Cholecalciferol  (VITAMIN D3 PO) Take by mouth. (Patient not taking: Reported on 01/04/2021)    ? tamsulosin (FLOMAX) 0.4 MG CAPS capsule TAKE ONE CAPSULE BY MOUTH DAILY (Patient not taking: Reported on 01/04/2021) 30 capsule 7  ? vitamin B-12 (CYANOCOBALAMIN) 1000 MCG tablet Take 1,000 mcg by mouth daily. (Patient not taking: Reported on 01/04/2021)    ? ?No facility-administered medications prior to visit.  ?  ? ?Per HPI unless specifically indicated in ROS section below ?Review of Systems  ?Constitutional:  Negative for fatigue and fever.  ?HENT:  Negative for ear pain.   ?Eyes:  Negative for pain.  ?Respiratory:  Negative for cough and shortness of breath.   ?Cardiovascular:  Negative for chest pain, palpitations and leg swelling.  ?Gastrointestinal:  Negative for abdominal pain.  ?Genitourinary:  Negative for dysuria.  ?Musculoskeletal:  Negative for arthralgias.  ?Neurological:  Negative for syncope, light-headedness and headaches.  ?Psychiatric/Behavioral:  Negative for dysphoric mood.   ?Objective:  ?BP 118/68   Pulse 64   Ht 6' 1"  (1.854 m)   Wt 191 lb (86.6 kg)   SpO2 97%   BMI 25.20 kg/m?   ?Wt Readings from Last 3 Encounters:  ?06/02/21 191 lb (86.6 kg)  ?01/04/21 188 lb (85.3 kg)  ?02/02/20 195 lb (88.5 kg)  ?  ?  ?Physical Exam ?Constitutional:   ?  Appearance: He is well-developed.  ?HENT:  ?   Head: Normocephalic.  ?   Right Ear: Hearing normal.  ?   Left Ear: Hearing normal.  ?   Nose: Nose normal.  ?Neck:  ?   Thyroid: No thyroid mass or thyromegaly.  ?   Vascular: No carotid bruit.  ?   Trachea: Trachea normal.  ?Cardiovascular:  ?   Rate and Rhythm: Normal rate and regular rhythm.  ?   Pulses: Normal pulses.  ?   Heart sounds: Heart sounds not distant. No murmur heard. ?  No friction rub. No gallop.  ?   Comments: No peripheral edema ?Pulmonary:  ?   Effort: Pulmonary effort is normal. No respiratory distress.  ?   Breath sounds: Normal breath sounds.  ?Abdominal:  ?   Tenderness: There is no right CVA  tenderness or left CVA tenderness.  ?   Hernia: A hernia is present. Hernia is present in the left inguinal area.  ?   Comments: Tender to palpation over anterior left groin, no bowel in scrotum but  possible palpated hernia in left groin with cough  ?Skin: ?   General: Skin is warm and dry.  ?   Findings: No rash.  ?Psychiatric:     ?   Speech: Speech normal.     ?   Behavior: Behavior normal.     ?   Thought Content: Thought content normal.  ? ?   ?Results for orders placed or performed in visit on 02/02/20  ?POCT Urinalysis Dipstick (Automated)  ?Result Value Ref Range  ? Color, UA yellow   ? Clarity, UA clear   ? Glucose, UA Negative Negative  ? Bilirubin, UA negative   ? Ketones, UA negative   ? Spec Grav, UA 1.025 1.010 - 1.025  ? Blood, UA 1+   ? pH, UA 5.5 5.0 - 8.0  ? Protein, UA Negative Negative  ? Urobilinogen, UA 0.2 0.2 or 1.0 E.U./dL  ? Nitrite, UA negative   ? Leukocytes, UA Negative Negative  ? ? ?This visit occurred during the SARS-CoV-2 public health emergency.  Safety protocols were in place, including screening questions prior to the visit, additional usage of staff PPE, and extensive cleaning of exam room while observing appropriate contact time as indicated for disinfecting solutions.  ? ?COVID 19 screen:  No recent travel or known exposure to [place] ?The patient denies respiratory symptoms of COVID 19 at this time. ?The importance of social distancing was discussed today.  ? ?Assessment and Plan ? ?  ?Problem List Items Addressed This Visit   ? ? Left groin pain - Primary  ?  Acute, worsening ? ?Pain is most consistent with left inguinal hernia.  There is possible hernia on physical exam and pain is at the location expected.  There is no testicular abnormalities no scrotal abnormalities, no abdominal pain. ?He has full range of motion of left hip joint and no evidence of referred pain from low back. ?He has no pain over groin muscles. ? ?I will move forward with  imaging to verify presence  of inguinal hernia.  I will check CBC and c-Met prior to imaging. ? ?Information provided for patient.  Reviewed return and ER precautions. ? ?  ?  ? Relevant Orders  ? CT PELVIS W CONTRAST  ? Comprehensive metabolic panel  ? CBC with Differential/Platelet  ? ? ? ?Eliezer Lofts, MD  ? ?

## 2021-06-02 NOTE — Assessment & Plan Note (Signed)
Acute, worsening ? ?Pain is most consistent with left inguinal hernia.  There is possible hernia on physical exam and pain is at the location expected.  There is no testicular abnormalities no scrotal abnormalities, no abdominal pain. ?He has full range of motion of left hip joint and no evidence of referred pain from low back. ?He has no pain over groin muscles. ? ?I will move forward with  imaging to verify presence of inguinal hernia.  I will check CBC and c-Met prior to imaging. ? ?Information provided for patient.  Reviewed return and ER precautions. ? ?

## 2021-06-02 NOTE — Patient Instructions (Addendum)
We will call to set up the CT scan. ?  Please stop at the lab to have labs drawn. ? Use ibuprofen 800 mg three times daily for pain. ? Go to ER if severe pain. ? ?

## 2021-06-05 ENCOUNTER — Other Ambulatory Visit (HOSPITAL_COMMUNITY): Payer: Self-pay

## 2021-06-05 ENCOUNTER — Other Ambulatory Visit: Payer: Self-pay | Admitting: Family Medicine

## 2021-06-05 MED ORDER — LOSARTAN POTASSIUM 50 MG PO TABS
50.0000 mg | ORAL_TABLET | Freq: Every day | ORAL | 0 refills | Status: DC
  Filled 2021-06-05: qty 90, 90d supply, fill #0

## 2021-06-06 ENCOUNTER — Telehealth: Payer: Self-pay | Admitting: Family Medicine

## 2021-06-06 NOTE — Telephone Encounter (Signed)
Pt wife called stating that pt saw Dr Diona Browner on 06/02/21. Pt wife stated that Dr Diona Browner was going to order a Cat Scan for pt. Pt wife states that they haven't heard anything. Please advise.  ?

## 2021-06-07 NOTE — Telephone Encounter (Signed)
Spoke with patient wife, aware that no Josem Kaufmann is required. She states that they are going to call to schedule for when they get back from out of town. Number given to the scheduling line - they are going to call to schedule CT.  ? ?Nothing further needed.  ? ?

## 2021-06-07 NOTE — Telephone Encounter (Signed)
Pt wife called checking on the status of pt CT Scan. Pt wife states that they will be going out of town on 06/09/21 and would like to know something before they go. Please advise. ?

## 2021-06-09 ENCOUNTER — Other Ambulatory Visit: Payer: PPO

## 2021-06-13 ENCOUNTER — Ambulatory Visit
Admission: RE | Admit: 2021-06-13 | Discharge: 2021-06-13 | Disposition: A | Discharge status: Home / Self Care | Payer: PPO | Source: Ambulatory Visit | Attending: Family Medicine | Admitting: Family Medicine

## 2021-06-13 DIAGNOSIS — R1032 Left lower quadrant pain: Secondary | ICD-10-CM | POA: Diagnosis not present

## 2021-06-13 DIAGNOSIS — N400 Enlarged prostate without lower urinary tract symptoms: Secondary | ICD-10-CM | POA: Diagnosis not present

## 2021-06-13 DIAGNOSIS — K5730 Diverticulosis of large intestine without perforation or abscess without bleeding: Secondary | ICD-10-CM | POA: Diagnosis not present

## 2021-06-13 MED ORDER — IOHEXOL 300 MG/ML  SOLN
100.0000 mL | Freq: Once | INTRAMUSCULAR | Status: AC | PRN
  Administered 2021-06-13: 100 mL via INTRAVENOUS

## 2021-06-16 ENCOUNTER — Encounter: Payer: PPO | Admitting: Family Medicine

## 2021-06-26 ENCOUNTER — Ambulatory Visit (INDEPENDENT_AMBULATORY_CARE_PROVIDER_SITE_OTHER)
Admission: RE | Admit: 2021-06-26 | Discharge: 2021-06-26 | Disposition: A | Discharge status: Home / Self Care | Payer: PPO | Source: Ambulatory Visit | Attending: Family Medicine | Admitting: Family Medicine

## 2021-06-26 ENCOUNTER — Ambulatory Visit (INDEPENDENT_AMBULATORY_CARE_PROVIDER_SITE_OTHER): Payer: PPO | Admitting: Family Medicine

## 2021-06-26 ENCOUNTER — Encounter: Payer: Self-pay | Admitting: Family Medicine

## 2021-06-26 VITALS — BP 126/70 | HR 75 | Temp 97.7°F | Ht 73.0 in | Wt 193.0 lb

## 2021-06-26 DIAGNOSIS — R1030 Lower abdominal pain, unspecified: Secondary | ICD-10-CM | POA: Diagnosis not present

## 2021-06-26 DIAGNOSIS — R1032 Left lower quadrant pain: Secondary | ICD-10-CM

## 2021-06-26 DIAGNOSIS — M533 Sacrococcygeal disorders, not elsewhere classified: Secondary | ICD-10-CM | POA: Diagnosis not present

## 2021-06-26 DIAGNOSIS — M1612 Unilateral primary osteoarthritis, left hip: Secondary | ICD-10-CM | POA: Diagnosis not present

## 2021-06-26 NOTE — Progress Notes (Signed)
? ? Patient ID: Jonathon Chavez, male    DOB: February 20, 1946, 75 y.o.   MRN: [id number] ? ?This visit was conducted in person. ? ?BP 126/70   Pulse 75   Temp 97.7 ?F (36.5 ?C)   Ht '6\' 1"'$  (1.854 m)   Wt 193 lb (87.5 kg)   SpO2 97%   BMI 25.46 kg/m?   ? ?CC: L groin pain  ?Subjective:  ? ?HPI: ?Jonathon Chavez is a 76 y.o. male presenting on 06/26/2021 for Groin Pain (C/o ongoing L groin pain. Seen about 1 mo ago for same sxs. Pain has not improved. Pt accompanied by wife, [name]. ) ? ? ?I last saw patient 01/2020 for CPE. ? ?Several month history of left groin pain.  ?Saw Dr Diona Browner last month - ?L inguinal hernia, CT scan did not show hernia. Diverticulosis without diverticulitis on CT. ??strained groin muscle.  ?Worse with twisting/turning.  ? ?Regularly walking 30-60 min daily. Notes does ok with walking, but pain worse with running. No new exercise routine. A few days a week works at [employer] at his [employer] - heavy box lifting doesn't affect this pain.  ? ?No fevers/chills, abd pain, nausea/vomiting, diarrhea, constipation, bowel changes or bladder symptoms of dysuria. No rectal pain or new lower back pain.  ? ?He's been taking ibuprofen '400mg'$  bid.  ?   ? ?Relevant past medical, surgical, family and social history reviewed and updated as indicated. Interim medical history since our last visit reviewed. ?Allergies and medications reviewed and updated. ?Outpatient Medications Prior to Visit  ?Medication Sig Dispense Refill  ? Ascorbic Acid (VITAMIN C) 1000 MG tablet Take 1,000 mg by mouth daily.    ? losartan (COZAAR) 50 MG tablet TAKE ONE TABLET BY MOUTH DAILY 90 tablet 0  ? pantoprazole (PROTONIX) 40 MG tablet Take 1 tablet (40 mg total) by mouth daily. 90 tablet 0  ? Multiple Vitamins-Minerals (EMERGEN-C IMMUNE PLUS PO) Take by mouth daily as needed.    ? ?No facility-administered medications prior to visit.  ?  ? ?Per HPI unless specifically indicated in ROS section below ?Review of Systems ? ?Objective:   ?BP 126/70   Pulse 75   Temp 97.7 ?F (36.5 ?C)   Ht '6\' 1"'$  (1.854 m)   Wt 193 lb (87.5 kg)   SpO2 97%   BMI 25.46 kg/m?   ?Wt Readings from Last 3 Encounters:  ?06/26/21 193 lb (87.5 kg)  ?06/02/21 191 lb (86.6 kg)  ?01/04/21 188 lb (85.3 kg)  ?  ?  ?Physical Exam ?Vitals and nursing note reviewed.  ?Constitutional:   ?   Appearance: Normal appearance. He is not ill-appearing.  ?Abdominal:  ?   General: Abdomen is flat. Bowel sounds are normal. There is no distension.  ?   Palpations: Abdomen is soft. There is no mass.  ?   Tenderness: There is no abdominal tenderness. There is no right CVA tenderness, left CVA tenderness, guarding or rebound.  ?   Hernia: No hernia is present.  ?   Comments: No obvious hernia  ?Musculoskeletal:  ?   Right lower leg: No edema.  ?   Left lower leg: No edema.  ?   Comments:  ?No pain midline spine ?No paraspinous mm tenderness ?Neg SLR bilaterally ?Reproducible discomfort with testing L hip in internal rotation  ?Skin: ?   General: Skin is warm and dry.  ?   Findings: No rash.  ?Neurological:  ?   Mental Status: He is alert.  ?  Psychiatric:     ?   Mood and Affect: Mood normal.     ?   Behavior: Behavior normal.  ? ?   ?Results for orders placed or performed in visit on 06/02/21  ?Comprehensive metabolic panel  ?Result Value Ref Range  ? Sodium 137 135 - 145 mEq/L  ? Potassium 4.0 3.5 - 5.1 mEq/L  ? Chloride 102 96 - 112 mEq/L  ? CO2 29 19 - 32 mEq/L  ? Glucose, Bld 118 (H) 70 - 99 mg/dL  ? BUN 25 (H) 6 - 23 mg/dL  ? Creatinine, Ser 0.94 0.40 - 1.50 mg/dL  ? Total Bilirubin 0.8 0.2 - 1.2 mg/dL  ? Alkaline Phosphatase 51 39 - 117 U/L  ? AST 19 0 - 37 U/L  ? ALT 19 0 - 53 U/L  ? Total Protein 7.6 6.0 - 8.3 g/dL  ? Albumin 4.3 3.5 - 5.2 g/dL  ? GFR 80.06 >60.00 mL/min  ? Calcium 9.6 8.4 - 10.5 mg/dL  ?CBC with Differential/Platelet  ?Result Value Ref Range  ? WBC 4.3 4.0 - 10.5 K/uL  ? RBC 4.70 4.22 - 5.81 Mil/uL  ? Hemoglobin 13.9 13.0 - 17.0 g/dL  ? HCT 39.9 39.0 - 52.0 %  ? MCV  84.8 78.0 - 100.0 fl  ? MCHC 34.7 30.0 - 36.0 g/dL  ? RDW 13.1 11.5 - 15.5 %  ? Platelets 174.0 150.0 - 400.0 K/uL  ? Neutrophils Relative % 58.1 43.0 - 77.0 %  ? Lymphocytes Relative 29.8 12.0 - 46.0 %  ? Monocytes Relative 8.6 3.0 - 12.0 %  ? Eosinophils Relative 2.7 0.0 - 5.0 %  ? Basophils Relative 0.8 0.0 - 3.0 %  ? Neutro Abs 2.5 1.4 - 7.7 K/uL  ? Lymphs Abs 1.3 0.7 - 4.0 K/uL  ? Monocytes Absolute 0.4 0.1 - 1.0 K/uL  ? Eosinophils Absolute 0.1 0.0 - 0.7 K/uL  ? Basophils Absolute 0.0 0.0 - 0.1 K/uL  ? ?CT PELVIS W CONTRAST ?CLINICAL DATA:  Left inguinal pain and soreness for 2 months ? ?EXAM: ?CT PELVIS WITH CONTRAST ? ?TECHNIQUE: ?Multidetector CT imaging of the pelvis was performed using the ?standard protocol following the bolus administration of intravenous ?contrast. ? ?RADIATION DOSE REDUCTION: This exam was performed according to the ?departmental dose-optimization program which includes automated ?exposure control, adjustment of the mA and/or kV according to ?patient size and/or use of iterative reconstruction technique. ? ?CONTRAST:  150m OMNIPAQUE IOHEXOL 300 MG/ML  SOLN ? ?COMPARISON:  10/16/2012 ? ?FINDINGS: ?Urinary Tract:  The distal ureters and bladder are unremarkable. ? ?Bowel: No bowel obstruction or ileus. Diverticulosis of the ?descending and proximal sigmoid colon without evidence of acute ?diverticulitis. Normal appendix right lower quadrant. ? ?Vascular/Lymphatic: No pathologically enlarged lymph nodes. No ?significant vascular abnormality seen. ? ?Reproductive:  Prostate is enlarged measuring 4.3 x 5.2 by 4.9 cm. ? ?Other: No free fluid or free intraperitoneal gas. No abdominal wall ?hernia. Specifically, no evidence of left inguinal hernia to ?correspond to the reported pain and soreness. ? ?Musculoskeletal: No acute or destructive bony lesions. Reconstructed ?images demonstrate no additional findings. ? ?IMPRESSION: ?1. Distal colonic diverticulosis without diverticulitis. ?2.  Enlarged prostate. ?3. No evidence of left inguinal hernia to correspond to the ?patient's reported pain and soreness. Otherwise unremarkable CT of ?the pelvis. ? ?Electronically Signed ?  By: MRanda NgoM.D. ?  On: 06/14/2021 11:18 ? ?Assessment & Plan:  ? ?Problem List Items Addressed This Visit   ? ? Left  groin pain - Primary  ?  Story/exam most consistent with left hip osteoarthritis. ?Recent labs and CT scan reassuringly negative for other pathology. ?Discussed NSAID use, check hip x-rays, and depending on degree of arthritis burden discussed possible orthopedic referral/evaluation. ?Patient and wife agree with plan. ? ?  ?  ? Relevant Orders  ? DG Hip Unilat W OR W/O Pelvis 2-3 Views Left  ?  ? ?No orders of the defined types were placed in this encounter. ? ?Orders Placed This Encounter  ?Procedures  ? DG Hip Unilat W OR W/O Pelvis 2-3 Views Left  ?  Standing Status:   Future  ?  Standing Expiration Date:   06/27/2022  ?  Order Specific Question:   Reason for Exam (SYMPTOM  OR DIAGNOSIS REQUIRED)  ?  Answer:   left groin pain eval hip OA  ?  Order Specific Question:   Preferred imaging location?  ?  Answer:   [facility]  ? ? ? ?Patient instructions: ?I think you have hip arthritis.  ?Left hip xray today ?Ok to continue ibuprofen 400-'600mg'$  2-3 times daily with meals for 1 week.  ?If significant arthritis, next step is likely orthopedic referral.  ? ?Follow up plan: ?Return if symptoms worsen or fail to improve. ? ?Ria Bush, MD   ?

## 2021-06-26 NOTE — Assessment & Plan Note (Signed)
Story/exam most consistent with left hip osteoarthritis. ?Recent labs and CT scan reassuringly negative for other pathology. ?Discussed NSAID use, check hip x-rays, and depending on degree of arthritis burden discussed possible orthopedic referral/evaluation. ?Patient and wife agree with plan. ?

## 2021-06-26 NOTE — Patient Instructions (Addendum)
I think you have hip arthritis.  ?Left hip xray today ?Ok to continue ibuprofen 400-'600mg'$  2-3 times daily with meals for 1 week.  ?If significant arthritis, next step is likely orthopedic referral.  ? ?Osteoarthritis ? ?Osteoarthritis is a type of arthritis. It refers to joint pain or joint disease. Osteoarthritis affects tissue that covers the ends of bones in joints (cartilage). Cartilage acts as a cushion between the bones and helps them move smoothly. Osteoarthritis occurs when cartilage in the joints gets worn down. Osteoarthritis is sometimes called "wear and tear" arthritis. ?Osteoarthritis is the most common form of arthritis. It often occurs in older people. It is a condition that gets worse over time. The joints most often affected by this condition are in the fingers, toes, hips, knees, and spine, including the neck and lower back. ?What are the causes? ?This condition is caused by the wearing down of cartilage that covers the ends of bones. ?What increases the risk? ?The following factors may make you more likely to develop this condition: ?Being age 74 or older. ?Obesity. ?Overuse of joints. ?Past injury of a joint. ?Past surgery on a joint. ?Family history of osteoarthritis. ?What are the signs or symptoms? ?The main symptoms of this condition are pain, swelling, and stiffness in the joint. Other symptoms may include: ?An enlarged joint. ?More pain and further damage caused by small pieces of bone or cartilage that break off and float inside of the joint. ?Small deposits of bone (osteophytes) that grow on the edges of the joint. ?A grating or scraping feeling inside the joint when you move it. ?Popping or creaking sounds when you move. ?Difficulty walking or exercising. ?An inability to grip items, twist your hand(s), or control the movements of your hands and fingers. ?How is this diagnosed? ?This condition may be diagnosed based on: ?Your medical history. ?A physical exam. ?Your symptoms. ?X-rays of  the affected joint(s). ?Blood tests to rule out other types of arthritis. ?How is this treated? ?There is no cure for this condition, but treatment can help control pain and improve joint function. Treatment may include a combination of therapies, such as: ?Pain relief techniques, such as: ?Applying heat and cold to the joint. ?Massage. ?A form of talk therapy called cognitive behavioral therapy (CBT). This therapy helps you set goals and follow up on the changes that you make. ?Medicines for pain and inflammation. The medicines can be taken by mouth or applied to the skin. They include: ?NSAIDs, such as ibuprofen. ?Prescription medicines. ?Strong anti-inflammatory medicines (corticosteroids). ?Certain nutritional supplements. ?A prescribed exercise program. You may work with a physical therapist. ?Assistive devices, such as a brace, wrap, splint, specialized glove, or cane. ?A weight control plan. ?Surgery, such as: ?An osteotomy. This is done to reposition the bones and relieve pain or to remove loose pieces of bone and cartilage. ?Joint replacement surgery. You may need this surgery if you have advanced osteoarthritis. ?Follow these instructions at home: ?Activity ?Rest your affected joints as told by your health care provider. ?Exercise as told by your health care provider. He or she may recommend specific types of exercise, such as: ?Strengthening exercises. These are done to strengthen the muscles that support joints affected by arthritis. ?Aerobic activities. These are exercises, such as brisk walking or water aerobics, that increase your heart rate. ?Range-of-motion activities. These help your joints move more easily. ?Balance and agility exercises. ?Managing pain, stiffness, and swelling ? ?  ? ?If directed, apply heat to the affected area as often  as told by your health care provider. Use the heat source that your health care provider recommends, such as a moist heat pack or a heating pad. ?If you have a  removable assistive device, remove it as told by your health care provider. ?Place a towel between your skin and the heat source. If your health care provider tells you to keep the assistive device on while you apply heat, place a towel between the assistive device and the heat source. ?Leave the heat on for 20-30 minutes. ?Remove the heat if your skin turns bright red. This is especially important if you are unable to feel pain, heat, or cold. You may have a greater risk of getting burned. ?If directed, put ice on the affected area. To do this: ?If you have a removable assistive device, remove it as told by your health care provider. ?Put ice in a plastic bag. ?Place a towel between your skin and the bag. If your health care provider tells you to keep the assistive device on during icing, place a towel between the assistive device and the bag. ?Leave the ice on for 20 minutes, 2-3 times a day. ?Move your fingers or toes often to reduce stiffness and swelling. ?Raise (elevate) the injured area above the level of your heart while you are sitting or lying down. ?General instructions ?Take over-the-counter and prescription medicines only as told by your health care provider. ?Maintain a healthy weight. Follow instructions from your health care provider for weight control. ?Do not use any products that contain nicotine or tobacco, such as cigarettes, e-cigarettes, and chewing tobacco. If you need help quitting, ask your health care provider. ?Use assistive devices as told by your health care provider. ?Keep all follow-up visits as told by your health care provider. This is important. ?Where to find more information ?[employer] of Arthritis and Musculoskeletal and Skin Diseases: www.niams.SouthEx[employer].es ?[employer] on Aging: http://kim-miller.com/ ?[employer] of Rheumatology: www.rheumatology.org ?Contact a health care provider if: ?You have redness, swelling, or a feeling of warmth in a joint that gets  worse. ?You have a fever along with joint or muscle aches. ?You develop a rash. ?You have trouble doing your normal activities. ?Get help right away if: ?You have pain that gets worse and is not relieved by pain medicine. ?Summary ?Osteoarthritis is a type of arthritis that affects tissue covering the ends of bones in joints (cartilage). ?This condition is caused by the wearing down of cartilage that covers the ends of bones. ?The main symptom of this condition is pain, swelling, and stiffness in the joint. ?There is no cure for this condition, but treatment can help control pain and improve joint function. ?This information is not intended to replace advice given to you by your health care provider. Make sure you discuss any questions you have with your health care provider. ?Document Revised: 02/02/2019 Document Reviewed: 02/02/2019 ?Elsevier Patient Education ? [place]. ? ?

## 2021-06-28 ENCOUNTER — Other Ambulatory Visit: Payer: Self-pay | Admitting: Family Medicine

## 2021-06-28 DIAGNOSIS — M25852 Other specified joint disorders, left hip: Secondary | ICD-10-CM

## 2021-07-11 ENCOUNTER — Encounter: Payer: Self-pay | Admitting: Physical Therapy

## 2021-07-11 ENCOUNTER — Ambulatory Visit: Payer: PPO | Attending: Family Medicine | Admitting: Physical Therapy

## 2021-07-11 DIAGNOSIS — M25852 Other specified joint disorders, left hip: Secondary | ICD-10-CM | POA: Diagnosis not present

## 2021-07-11 DIAGNOSIS — R262 Difficulty in walking, not elsewhere classified: Secondary | ICD-10-CM | POA: Diagnosis not present

## 2021-07-11 DIAGNOSIS — M25552 Pain in left hip: Secondary | ICD-10-CM | POA: Diagnosis not present
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
## 2021-07-13 ENCOUNTER — Encounter: Payer: PPO | Admitting: Physical Therapy

## 2021-07-13 ENCOUNTER — Ambulatory Visit: Payer: PPO | Admitting: Physical Therapy

## 2021-07-13 DIAGNOSIS — M25552 Pain in left hip: Secondary | ICD-10-CM

## 2021-07-13 DIAGNOSIS — R262 Difficulty in walking, not elsewhere classified: Secondary | ICD-10-CM

## 2021-07-13 NOTE — Therapy (Signed)
OUTPATIENT PHYSICAL THERAPY TREATMENT NOTE   Patient Name: Jonathon Chavez MRN: [id number] DOB:09/09/1945, 76 y.o., male Today's Date: 07/13/2021  PCP: Dr. Ria Bush  REFERRING PROVIDER: Dr. Ria Bush   END OF SESSION:   PT End of Session - 07/13/21 1724     Visit Number 2    Number of Visits 16    Date for PT Re-Evaluation 09/05/21    PT Start Time 79    PT Stop Time 9563    PT Time Calculation (min) 45 min    Activity Tolerance Patient limited by pain    Behavior During Therapy [facility] for tasks assessed/performed             Past Medical History:  Diagnosis Date   BPH (benign prostatic hypertrophy)    GERD (gastroesophageal reflux disease)    Hemorrhoids    Hypertension    Past Surgical History:  Procedure Laterality Date   CATARACT EXTRACTION Bilateral 2018   Dr Clent Jacks   CHOLECYSTECTOMY  8/14   Dr Pat Patrick   COLONOSCOPY  03/2013   sessile serrated polyp, diverticulosis, rpt 5 yrs (Pyrtle)   COLONOSCOPY  10/20/2019   TA, SSP, severe diverticulosis, grade IV hem (Pyrtle)   UPPER GASTROINTESTINAL ENDOSCOPY  10/20/2019   Barrett's, 2cm HH (Pyrtle)   Patient Active Problem List   Diagnosis Date Noted   Femoroacetabular impingement of left hip 06/02/2021   Barrett esophagus 02/02/2020   Thrombocytopenia ([place]) 01/23/2020   Branch retinal vein occlusion of left eye 08/11/2019   Left retinoschisis 08/11/2019   Retinal microaneurysm of left eye 08/11/2019   Medicare annual wellness visit, subsequent 12/25/2018   Microhematuria 12/25/2018   Right shoulder pain 03/10/2017   Hypertension, essential 12/12/2016   Eye problem 09/20/2016   Vitiligo 08/27/2016   Benign skin growth 02/16/2016   Dyslipidemia 02/16/2016   Advanced care planning/counseling discussion 02/16/2016   Benign prostatic hyperplasia    Routine general medical examination at a health care facility 08/28/2010   GERD 09/02/2007    REFERRING DIAG: REFERRING DIAG: M25.852  (ICD-10-CM) - Femoroacetabular impingement of left hip  THERAPY DIAG:  Pain in left hip  Difficulty in walking, not elsewhere classified  Rationale for Evaluation and Treatment Rehabilitation  PERTINENT HISTORY: N/a  PRECAUTIONS: None   SUBJECTIVE: Pt reports increased pain in left hip since doing his exercises   PAIN:  Are you having pain? Yes: NPRS scale: 3 to 4/10 Pain location: Left groin  Pain description: standing up from sitting down for a long time Aggravating factors: bending down  Relieving factors: Sitting and using Ibuprofen   OBJECTIVE       VITALS: BP 156/67 HR 73 SpO2 99   DIAGNOSTIC FINDINGS:    CLINICAL DATA:  Left groin pain.  Evaluate for hip osteoarthritis.   EXAM: DG HIP (WITH OR WITHOUT PELVIS) 2-3V LEFT   COMPARISON:  None   FINDINGS::  The bilateral femoroacetabular joint spaces are maintained. There is moderate convexity of the anterior superior left femoral head-neck junction that increases risk for CAM-type femoroacetabular impingement. Minimal 1 mm cortical step-off in this region on lateral view is favored to be secondary to bone overlap.   There are mild-to-moderate subchondral degenerative cystic changes within the this region of the anterior superior left femoral head-neck junction consistent with CAM-type femoroacetabular impingement.   Minimal inferior left sacroiliac joint subchondral sclerosis degenerative change. The pubic symphysis joint space is maintained. No dislocation.   Moderate stool throughout the colon. Mild retained oral  contrast within diverticula within the descending colon and proximal sigmoid colon.   IMPRESSION:: IMPRESSION: 1. No significant arthritis within either hip. 2. There is a CAM-type bump deformity within the anterior superior left femoral head-neck junction that increases risk for CAM-type femoroacetabular impingement.   PATIENT SURVEYS:  FOTO 57/72   COGNITION:           Overall  cognitive status:  Forgot incident of fall indicating some cognitive deficits                            SENSATION: WFL   MUSCLE LENGTH: Hamstrings: Right 90 deg; Left 90 deg Thomas test: Positive Bilateral    POSTURE: No Significant postural limitations   PALPATION: Left groin    LOWER EXTREMITY ROM:                        Active  Right 06/12/2021 Left 06/12/2021  Hip flexion 120 120  Hip extension 30 30  Hip abduction 45 45  Hip adduction 30 30  Hip internal rotation 45 45  Hip external rotation 45 45  Knee flexion 135 135  Knee extension 0 0  Ankle dorsiflexion 20 20  Ankle plantarflexion 50 50  Ankle inversion 35 35  Ankle eversion 15 15                                               (Blank rows = not tested)   LOWER EXTREMITY MMT:   MMT Right eval Left eval  Hip flexion 5 5  Hip extension 3+ 3+  Hip abduction 4+ 4+  Hip adduction 4 3+  Hip internal rotation      Hip external rotation      Knee flexion 5 5  Knee extension 5 5  Ankle dorsiflexion 5 5  Ankle plantarflexion      Ankle inversion      Ankle eversion       (Blank rows = not tested)              LOWER EXTREMITY SPECIAL TESTS:  Hip special tests: Saralyn Pilar (FABER) test: negative, Thomas test: positive , Ober's test: negative, Ely's test: positive LLE, and Hip scouring test: negative, FADIR: negative    FUNCTIONAL TESTS:  5 times sit to stand: NT  30 seconds chair stand test   GAIT: Distance walked: 25 ft  Assistive device utilized: None Level of assistance: Complete Independence Comments: Antalgic gait with decreased stance time on LLE and decreased step length with RLE        TODAY'S TREATMENT:        07/13/21            Nu-Step Seat 11 with level 2 resistance   5 Times Sit to Stand: 16 sec  (Individuals with times that exceed the listed time have worse than average performance - predictive of recurrent falls in healthy community-living subjects         - 70-79 y.o. 12.6 sec              30 sec chair stands= 12 reps *70-74 males <12 signifies an increased risk for falls             Side lying Hip Abduction 3 x 10  -mod VC and TC to adjust  hips from rotating forward and to extend leg backward to avoid compensating with hip flexion  Sitting Ball Squeeze with Hip Adduction Isometric with 3 sec hold 1 x 10  -Pt reports increased left groin pain that leads him to self-terminate exercise   Supine Bridges with Ball Squeeze 3 x 10   Standing Hip Adduction with Sliders 3 x 10  Initial   Prone Quad Stretch 2 x 30 sec on LLE  Supine Bridges 3 x 10      PATIENT EDUCATION:  Education details: form and technique for appropriate exercise and education about underlying pathology  Person educated: Patient and Spouse Education method: Consulting civil engineer, Media planner, Verbal cues, and Handouts Education comprehension: verbalized understanding, returned demonstration, verbal cues required, and tactile cues required     HOME EXERCISE PROGRAM: Access Code: [email or web address] URL: [email or web address] Date: 07/13/2021 Prepared by: Bradly Chris  Exercises - Prone Quadriceps Stretch with Strap  - 1 x daily - 7 x weekly - 1 sets - 3 reps - 60 hold - Sidelying Hip Abduction at Wall  - 1 x daily - 3 x weekly - 3 sets - 10 reps - Supine Bridge with Mini Swiss Ball Between Knees  - 1 x daily - 3 x weekly - 3 sets - 10 reps - Standing Hip Adduction with Counter Support  - 1 x daily - 3 x weekly - 3 sets - 10 reps   ASSESSMENT:   CLINICAL IMPRESSION: Pt exhibits decreased LE strength from left groin pain from impingement. He was also limited by pain performing isometric hip adduction. Due to hip abductor weakness, pt shows increased compensation with forward pelvic rotation and hip flexion that require significant correction for him to perform correctly. All other exercises were able to be performed by patient without experiencing an increase in his left groin pain. He will  continue tobenefit from skilled PT to reduce left hip pain and to increased left hip strength to maintain mobility in order to maintain quality of life by participating in volunteering at [employer] back walking and lifting food boxes and to decrease caregiver burden.   OBJECTIVE IMPAIRMENTS Abnormal gait, decreased endurance, difficulty walking, decreased strength, impaired flexibility, and pain.    ACTIVITY LIMITATIONS squatting, stairs, transfers, and locomotion level   PARTICIPATION LIMITATIONS: cleaning, shopping, community activity, and yard work   [place] Age and Time since onset of injury/illness/exacerbation are also affecting patient's functional outcome.    REHAB POTENTIAL: Good   CLINICAL DECISION MAKING: Stable/uncomplicated   EVALUATION COMPLEXITY: Low     GOALS: Goals reviewed with patient? No   SHORT TERM GOALS: Target date: 07/25/2021    Pt will be independent with HEP in order to improve strength and balance in order to decrease fall risk and improve function at home and work. Baseline: NT  Goal status: INITIAL       LONG TERM GOALS: Target date: 09/05/2021    Patient will have improved function and activity level as evidenced by an increase in FOTO score by 10 points or more.  Baseline: 57/72 Goal status: INITIAL   2.  Patient will improve gait mechanisms to achieve symmetrical stance time and step length between RLE and LLE for increased stability while ambulating.  Baseline: Decreased stance time on LLE and step length on RLE  Goal status: INITIAL   3.  Patient will demonstrate increase in hip MMT by 1 grade for increased hip stability to better tolerate load placed on hip in weight bearing and non-weight bearing positions.  Baseline:  Goal status: INITIAL   4.  Pt will decrease 5TSTS by at least 3 seconds in order to demonstrate clinically significant improvement in LE strength Baseline: NT  Goal status: INITIAL   5.  Patient will increase the  number of chair stands to >=12 reps to improve her/his LE endurance and to reduce his risk of falls.    Baseline: NT  Goal status: INITIAL   PLAN: PT FREQUENCY: 1-2x/week   PT DURATION: 8 weeks   PLANNED INTERVENTIONS: Therapeutic exercises, Therapeutic activity, Neuromuscular re-education, Balance training, Gait training, Patient/Family education, Joint manipulation, Joint mobilization, Stair training, DME instructions, Aquatic Therapy, Dry Needling, Electrical stimulation, Spinal manipulation, Spinal mobilization, Cryotherapy, Moist heat, Manual therapy, and Re-evaluation   PLAN FOR NEXT SESSION:  gait analysis, progress hip strengthening exercises (lunges)    Bradly Chris PT, DPT  Daneil Dan, PT 07/13/2021, 5:25 PM

## 2021-07-17 DIAGNOSIS — J189 Pneumonia, unspecified organism: Secondary | ICD-10-CM | POA: Diagnosis not present

## 2021-07-17 DIAGNOSIS — R070 Pain in throat: Secondary | ICD-10-CM | POA: Diagnosis not present

## 2021-07-17 DIAGNOSIS — R051 Acute cough: Secondary | ICD-10-CM | POA: Diagnosis not present

## 2021-07-18 ENCOUNTER — Ambulatory Visit: Payer: PPO | Admitting: Physical Therapy

## 2021-07-20 ENCOUNTER — Encounter: Payer: PPO | Admitting: Physical Therapy

## 2021-07-25 ENCOUNTER — Ambulatory Visit: Payer: PPO | Admitting: Physical Therapy

## 2021-07-27 ENCOUNTER — Ambulatory Visit: Payer: PPO | Admitting: Physical Therapy

## 2021-08-01 ENCOUNTER — Encounter: Payer: Self-pay | Admitting: Physical Therapy

## 2021-08-01 ENCOUNTER — Ambulatory Visit: Payer: PPO | Attending: Family Medicine | Admitting: Physical Therapy

## 2021-08-01 DIAGNOSIS — M25552 Pain in left hip: Secondary | ICD-10-CM | POA: Insufficient documentation

## 2021-08-01 DIAGNOSIS — R262 Difficulty in walking, not elsewhere classified: Secondary | ICD-10-CM | POA: Insufficient documentation

## 2021-08-01 NOTE — Therapy (Signed)
OUTPATIENT PHYSICAL THERAPY TREATMENT NOTE   Patient Name: Jonathon Chavez MRN: [id number] DOB:[birth year], 76 y.o., male Today's Date: 08/01/2021  PCP: Dr. Ria Bush  REFERRING PROVIDER: Dr. Ria Bush   END OF SESSION:   PT End of Session - 08/01/21 1026     Visit Number 3    Number of Visits 16    Date for PT Re-Evaluation 09/05/21    PT Start Time 1020    PT Stop Time 1100    PT Time Calculation (min) 40 min    Activity Tolerance Patient limited by pain    Behavior During Therapy [facility] for tasks assessed/performed             Past Medical History:  Diagnosis Date   BPH (benign prostatic hypertrophy)    GERD (gastroesophageal reflux disease)    Hemorrhoids    Hypertension    Past Surgical History:  Procedure Laterality Date   CATARACT EXTRACTION Bilateral 2018   Dr Clent Jacks   CHOLECYSTECTOMY  8/14   Dr Pat Patrick   COLONOSCOPY  03/2013   sessile serrated polyp, diverticulosis, rpt 5 yrs (Pyrtle)   COLONOSCOPY  10/20/2019   TA, SSP, severe diverticulosis, grade IV hem (Pyrtle)   UPPER GASTROINTESTINAL ENDOSCOPY  10/20/2019   Barrett's, 2cm HH (Pyrtle)   Patient Active Problem List   Diagnosis Date Noted   Femoroacetabular impingement of left hip 06/02/2021   Barrett esophagus 02/02/2020   Thrombocytopenia ([place]) 01/23/2020   Branch retinal vein occlusion of left eye 08/11/2019   Left retinoschisis 08/11/2019   Retinal microaneurysm of left eye 08/11/2019   Medicare annual wellness visit, subsequent 12/25/2018   Microhematuria 12/25/2018   Right shoulder pain 03/10/2017   Hypertension, essential 12/12/2016   Eye problem 09/20/2016   Vitiligo 08/27/2016   Benign skin growth 02/16/2016   Dyslipidemia 02/16/2016   Advanced care planning/counseling discussion 02/16/2016   Benign prostatic hyperplasia    Routine general medical examination at a health care facility 08/28/2010   GERD 09/02/2007    REFERRING DIAG: REFERRING DIAG: M25.852  (ICD-10-CM) - Femoroacetabular impingement of left hip  THERAPY DIAG:  Pain in left hip  Difficulty in walking, not elsewhere classified  Rationale for Evaluation and Treatment Rehabilitation  PERTINENT HISTORY: N/a  PRECAUTIONS: None   SUBJECTIVE: Pt is returning after missing several weeks because of pneumonia. He describes feeling severe neck pain that is worsening over the past few weeks and it is now more severe than hip pain.   PAIN:  Are you having pain? Yes: NPRS scale: 3 to 4/10 Pain location: Occiput neck pain  Pain description: Sharp  Aggravating factors: turning neck  Relieving factors: Sitting and using Ibuprofen   OBJECTIVE       VITALS: BP 156/67 HR 73 SpO2 99   DIAGNOSTIC FINDINGS:    CLINICAL DATA:  Left groin pain.  Evaluate for hip osteoarthritis.   EXAM: DG HIP (WITH OR WITHOUT PELVIS) 2-3V LEFT   COMPARISON:  None   FINDINGS::  The bilateral femoroacetabular joint spaces are maintained. There is moderate convexity of the anterior superior left femoral head-neck junction that increases risk for CAM-type femoroacetabular impingement. Minimal 1 mm cortical step-off in this region on lateral view is favored to be secondary to bone overlap.   There are mild-to-moderate subchondral degenerative cystic changes within the this region of the anterior superior left femoral head-neck junction consistent with CAM-type femoroacetabular impingement.   Minimal inferior left sacroiliac joint subchondral sclerosis degenerative change. The pubic symphysis  joint space is maintained. No dislocation.   Moderate stool throughout the colon. Mild retained oral contrast within diverticula within the descending colon and proximal sigmoid colon.   IMPRESSION:: IMPRESSION: 1. No significant arthritis within either hip. 2. There is a CAM-type bump deformity within the anterior superior left femoral head-neck junction that increases risk for  CAM-type femoroacetabular impingement.   PATIENT SURVEYS:  FOTO 57/72   COGNITION:           Overall cognitive status:  Forgot incident of fall indicating some cognitive deficits                            SENSATION: WFL   MUSCLE LENGTH: Hamstrings: Right 90 deg; Left 90 deg Thomas test: Positive Bilateral    POSTURE: No Significant postural limitations   PALPATION: Left groin    LOWER EXTREMITY ROM:                        Active  Right 06/12/2021 Left 06/12/2021  Hip flexion 120 120  Hip extension 30 30  Hip abduction 45 45  Hip adduction 30 30  Hip internal rotation 45 45  Hip external rotation 45 45  Knee flexion 135 135  Knee extension 0 0  Ankle dorsiflexion 20 20  Ankle plantarflexion 50 50  Ankle inversion 35 35  Ankle eversion 15 15                                               (Blank rows = not tested)   LOWER EXTREMITY MMT:   MMT Right eval Left eval  Hip flexion 5 5  Hip extension 3+ 3+  Hip abduction 4+ 4+  Hip adduction 4 3+  Hip internal rotation      Hip external rotation      Knee flexion 5 5  Knee extension 5 5  Ankle dorsiflexion 5 5  Ankle plantarflexion      Ankle inversion      Ankle eversion       (Blank rows = not tested)              LOWER EXTREMITY SPECIAL TESTS:  Hip special tests: Saralyn Pilar (FABER) test: negative, Thomas test: positive , Ober's test: negative, Ely's test: positive LLE, and Hip scouring test: negative, FADIR: negative    FUNCTIONAL TESTS:  5 times sit to stand: NT  30 seconds chair stand test   GAIT: Distance walked: 25 ft  Assistive device utilized: None Level of assistance: Complete Independence Comments: Antalgic gait with decreased stance time on LLE and decreased step length with RLE        TODAY'S TREATMENT:         08/01/21  Nu Step Seat 9 and Arms 9 with 0 resistance   No to dysphagia, diplopia, dysarthria, nystagmus or nausea   Cervical ROM  -Rotation R/L 40/40  -Lateral Flex R/L  20/20  -Flexion 45 -Ext 45   Upper Trap Stretch R/L 4 x 60 sec    MANUAL THERAPY   Upper Trap soft tissue massage and trigger massage  Cervical distraction   07/13/21            Nu-Step Seat 11 with level 2 resistance   5 Times Sit to Stand: 16 sec  (Individuals  with times that exceed the listed time have worse than average performance - predictive of recurrent falls in healthy community-living subjects         - 70-79 y.o. 12.6 sec             30 sec chair stands= 12 reps *70-74 males <12 signifies an increased risk for falls             Side lying Hip Abduction 3 x 10  -mod VC and TC to adjust hips from rotating forward and to extend leg backward to avoid compensating with hip flexion  Sitting Ball Squeeze with Hip Adduction Isometric with 3 sec hold 1 x 10  -Pt reports increased left groin pain that leads him to self-terminate exercise   Supine Bridges with Ball Squeeze 3 x 10   Standing Hip Adduction with Sliders 3 x 10  Initial   Prone Quad Stretch 2 x 30 sec on LLE  Supine Bridges 3 x 10      PATIENT EDUCATION:  Education details: form and technique for appropriate exercise and education about underlying pathology  Person educated: Patient and Spouse Education method: Consulting civil engineer, Media planner, Verbal cues, and Handouts Education comprehension: verbalized understanding, returned demonstration, verbal cues required, and tactile cues required     HOME EXERCISE PROGRAM: Access Code: [email or web address] URL: [email or web address] Date: 08/01/2021 Prepared by: Bradly Chris  Exercises - Prone Quadriceps Stretch with Strap  - 1 x daily - 7 x weekly - 1 sets - 3 reps - 60 hold - Sidelying Hip Abduction at Wall  - 1 x daily - 3 x weekly - 3 sets - 10 reps - Supine Bridge with Mini Swiss Ball Between Knees  - 1 x daily - 3 x weekly - 3 sets - 10 reps - Standing Hip Adduction with Counter Support  - 1 x daily - 3 x weekly - 3 sets - 10 reps - Seated Cervical  Sidebending Stretch  - 1 x daily - 7 x weekly - 1 sets - 3 reps - 60 hold   ASSESSMENT:   CLINICAL IMPRESSION: Pt returns after several weeks of absence due to pneumonia. He reports severe neck pain that has been worsening for past several months. After initial screen, pt presents with signs and symptoms of arthritis with pain with neck AROM and PROM especially rotation and side bending and pain localized over cervical spine. All red flags ruled out. Increased trap tension but not severe indicating that pain is likely not muscular in origin. Pt will be following up with PCP for further eval and treatment of neck pain. Pt instructed to communicate with PCP to write new order for neck pain as well as hip pain, so both body parts can be treated within the same plan of care. This was also communicated with PCP as well.  He will continue to benefit from skilled PT to reduce left hip pain and to increased left hip strength to maintain mobility in order to maintain quality of life by participating in volunteering at [employer] back walking and lifting food boxes and to decrease caregiver burden.    OBJECTIVE IMPAIRMENTS Abnormal gait, decreased endurance, difficulty walking, decreased strength, impaired flexibility, and pain.    ACTIVITY LIMITATIONS squatting, stairs, transfers, and locomotion level   PARTICIPATION LIMITATIONS: cleaning, shopping, community activity, and yard work   Jonathon Chavez Age and Time since onset of injury/illness/exacerbation are also affecting patient's functional outcome.    REHAB POTENTIAL: Good   CLINICAL DECISION MAKING: Stable/uncomplicated  EVALUATION COMPLEXITY: Low     GOALS: Goals reviewed with patient? No   SHORT TERM GOALS: Target date: 07/25/2021    Pt will be independent with HEP in order to improve strength and balance in order to decrease fall risk and improve function at home and work. Baseline: NT  Goal status: INITIAL       LONG TERM GOALS: Target  date: 09/05/2021    Patient will have improved function and activity level as evidenced by an increase in FOTO score by 10 points or more.  Baseline: 57/72 Goal status: INITIAL   2.  Patient will improve gait mechanisms to achieve symmetrical stance time and step length between RLE and LLE for increased stability while ambulating.  Baseline: Decreased stance time on LLE and step length on RLE  Goal status: INITIAL   3.  Patient will demonstrate increase in hip MMT by 1 grade for increased hip stability to better tolerate load placed on hip in weight bearing and non-weight bearing positions.  Baseline:  Goal status: INITIAL   4.  Pt will decrease 5TSTS by at least 3 seconds in order to demonstrate clinically significant improvement in LE strength Baseline: NT  Goal status: INITIAL   5.  Patient will increase the number of chair stands to >=12 reps to improve her/his LE endurance and to reduce his risk of falls.    Baseline: NT  Goal status: INITIAL   PLAN: PT FREQUENCY: 1-2x/week   PT DURATION: 8 weeks   PLANNED INTERVENTIONS: Therapeutic exercises, Therapeutic activity, Neuromuscular re-education, Balance training, Gait training, Patient/Family education, Joint manipulation, Joint mobilization, Stair training, DME instructions, Aquatic Therapy, Dry Needling, Electrical stimulation, Spinal manipulation, Spinal mobilization, Cryotherapy, Moist heat, Manual therapy, and Re-evaluation   PLAN FOR NEXT SESSION:  gait analysis, self hip mobilizations, manual for hip mobilizations   Bradly Chris PT, DPT  Daneil Dan, PT 08/01/2021, 10:27 AM

## 2021-08-02 ENCOUNTER — Other Ambulatory Visit: Payer: PPO

## 2021-08-02 ENCOUNTER — Encounter: Payer: Self-pay | Admitting: Family Medicine

## 2021-08-02 ENCOUNTER — Other Ambulatory Visit (HOSPITAL_COMMUNITY): Payer: Self-pay

## 2021-08-02 ENCOUNTER — Ambulatory Visit (INDEPENDENT_AMBULATORY_CARE_PROVIDER_SITE_OTHER): Payer: PPO | Admitting: Family Medicine

## 2021-08-02 VITALS — BP 140/70 | HR 88 | Temp 97.7°F | Ht 73.0 in | Wt 187.1 lb

## 2021-08-02 DIAGNOSIS — M25852 Other specified joint disorders, left hip: Secondary | ICD-10-CM

## 2021-08-02 DIAGNOSIS — M542 Cervicalgia: Secondary | ICD-10-CM | POA: Diagnosis not present

## 2021-08-02 DIAGNOSIS — M5030 Other cervical disc degeneration, unspecified cervical region: Secondary | ICD-10-CM | POA: Insufficient documentation

## 2021-08-02 MED ORDER — METHOCARBAMOL 500 MG PO TABS
500.0000 mg | ORAL_TABLET | Freq: Three times a day (TID) | ORAL | 0 refills | Status: DC | PRN
  Filled 2021-08-02: qty 30, 10d supply, fill #0

## 2021-08-02 NOTE — Progress Notes (Signed)
Patient ID: Jonathon Chavez, male    DOB: 05-May-1945, 76 y.o.   MRN: [id number]  This visit was conducted in person.  BP 140/70   Pulse 88   Temp 97.7 F (36.5 C) (Temporal)   Ht '6\' 1"'$  (1.854 m)   Wt 187 lb 2 oz (84.9 kg)   SpO2 95%   BMI 24.69 kg/m    CC: neck pain  Subjective:   HPI: Jonathon Chavez is a 76 y.o. male presenting on 08/02/2021 for Neck Pain (C/o neck pain.  Started about 3 wks ago. H/o neck pain off and on, this time not resolving.  Tried ibuprofen at night, barely helpful. Pt accompanied by wife, Jonathon Chavez. )   3 wk h/o neck pain, R -> L discomfort with radiation into occiput associated with HA, describes sharp stabbing pain "like glass", worse when laying on his back. Pain can be severe to 10/10, affecting ability to sleep. Some radiation of pain to R posterior shoulder but no shooting pain down arms or numbness/tingling or weakness of arms/hands.  Denies inciting trauma/injury or fall.  Tried ibuprofen '600mg'$  at night without significant benefit.   Undergoing PT for L groin pain - L hip osteoarthritis vs femoro-acetabular impingement. No significant improvement noted as of yet.   Seen at [facility] 07/17/2021 dx L lung PNA treated with zpack and amoxicillin, pneumonia has resolved. Felt wiped out after abx, ?thrush. Significant cough during pneumonia.      Relevant past medical, surgical, family and social history reviewed and updated as indicated. Interim medical history since our last visit reviewed. Allergies and medications reviewed and updated. Outpatient Medications Prior to Visit  Medication Sig Dispense Refill   Ascorbic Acid (VITAMIN C) 1000 MG tablet Take 1,000 mg by mouth daily.     losartan (COZAAR) 50 MG tablet TAKE ONE TABLET BY MOUTH DAILY 90 tablet 0   pantoprazole (PROTONIX) 40 MG tablet Take 1 tablet (40 mg total) by mouth daily. 90 tablet 0   No facility-administered medications prior to visit.     Per HPI unless specifically indicated  in ROS section below Review of Systems  Objective:  BP 140/70   Pulse 88   Temp 97.7 F (36.5 C) (Temporal)   Ht '6\' 1"'$  (1.854 m)   Wt 187 lb 2 oz (84.9 kg)   SpO2 95%   BMI 24.69 kg/m   Wt Readings from Last 3 Encounters:  08/02/21 187 lb 2 oz (84.9 kg)  06/26/21 193 lb (87.5 kg)  06/02/21 191 lb (86.6 kg)      Physical Exam Vitals and nursing note reviewed.  Constitutional:      Appearance: Normal appearance. He is not ill-appearing.  Neck:     Comments: No significant midline spine tenderness, discomfort to palpation L>R paracervical mm. FROM neck flexion/extension with limited ROM with lateral rotation and flexion bilaterally. No significant trapezius mm or rhomboid discomfort. No reproducible pain to palpation at bilateral shoulders.  Musculoskeletal:     Cervical back: Normal range of motion and neck supple. Tenderness present. No rigidity.  Lymphadenopathy:     Cervical: No cervical adenopathy.  Skin:    General: Skin is warm and dry.     Findings: No rash.  Neurological:     General: No focal deficit present.     Mental Status: He is alert.     Comments:  5/5 strength BUE Grip strength intact Sensation intact BUE  Psychiatric:  Mood and Affect: Mood normal.        Behavior: Behavior normal.        Assessment & Plan:   Problem List Items Addressed This Visit     Femoroacetabular impingement of left hip    Continue PT.  If no better, return to see sports medicine Dr Lorelei Pont.       Neck pain - Primary    3 wks of moderate to severe neck pain without neurological symptoms or significant red flags besides severity of pain.  Will treat as cervical neck strain/spasm with robaxin muscle relaxant course, continue ibuprofen and heating pad. Provided with gentle stretching exercises from SM pt advisor.  If not improving with treatment, low threshold to further image neck including flexion/extension views and if xrays ok, will refer to PT for neck as well as  L hip.       Relevant Orders   DG Cervical Spine Complete   DG Cervical Spine With Flex & Extend     Meds ordered this encounter  Medications   methocarbamol (ROBAXIN) 500 MG tablet    Sig: Take 1 tablet (500 mg total) by mouth 3 (three) times daily as needed for muscle spasms (sedation precations).    Dispense:  30 tablet    Refill:  0   Orders Placed This Encounter  Procedures   DG Cervical Spine Complete    Standing Status:   Future    Standing Expiration Date:   08/04/2022    Order Specific Question:   Reason for Exam (SYMPTOM  OR DIAGNOSIS REQUIRED)    Answer:   severe neck pain x 3 wks  without inciting trauma    Order Specific Question:   Preferred imaging location?    Answer:   [facility] Cervical Spine With Flex & Extend    Standing Status:   Future    Standing Expiration Date:   08/04/2022    Order Specific Question:   Reason for Exam (SYMPTOM  OR DIAGNOSIS REQUIRED)    Answer:   severe neck pain x 3 wks  without inciting trauma    Order Specific Question:   Preferred imaging location?    Answer:   [facility]     Patient Instructions  I think you have neck strain/spasm.  Treat with gentle stretching, heating pad to neck, start muscle relaxant mainly at night time as it can make you sleepy.  We will refer you back to PT for assistance as well.  Let us know if not improving and I will order neck xray for further evaluation.   Follow up plan: Return if symptoms worsen or fail to improve.  Ria Bush, MD

## 2021-08-02 NOTE — Assessment & Plan Note (Addendum)
Continue PT.  If no better, return to see sports medicine Dr Lorelei Pont.

## 2021-08-02 NOTE — Patient Instructions (Addendum)
I think you have neck strain/spasm.  Treat with gentle stretching, heating pad to neck, start muscle relaxant mainly at night time as it can make you sleepy.  We will refer you back to PT for assistance as well.  Let us know if not improving and I will order neck xray for further evaluation.

## 2021-08-03 ENCOUNTER — Ambulatory Visit (INDEPENDENT_AMBULATORY_CARE_PROVIDER_SITE_OTHER)
Admission: RE | Admit: 2021-08-03 | Discharge: 2021-08-03 | Disposition: A | Discharge status: Home / Self Care | Payer: PPO | Source: Ambulatory Visit | Attending: Family Medicine | Admitting: Family Medicine

## 2021-08-03 ENCOUNTER — Telehealth: Payer: Self-pay | Admitting: Family Medicine

## 2021-08-03 ENCOUNTER — Ambulatory Visit: Payer: PPO | Admitting: Physical Therapy

## 2021-08-03 ENCOUNTER — Encounter: Payer: Self-pay | Admitting: Physical Therapy

## 2021-08-03 DIAGNOSIS — M542 Cervicalgia: Secondary | ICD-10-CM

## 2021-08-03 DIAGNOSIS — M4312 Spondylolisthesis, cervical region: Secondary | ICD-10-CM | POA: Diagnosis not present

## 2021-08-03 DIAGNOSIS — M25552 Pain in left hip: Secondary | ICD-10-CM | POA: Diagnosis not present

## 2021-08-03 DIAGNOSIS — R262 Difficulty in walking, not elsewhere classified: Secondary | ICD-10-CM

## 2021-08-03 NOTE — Therapy (Signed)
OUTPATIENT PHYSICAL THERAPY TREATMENT NOTE   Patient Name: Jonathon Chavez MRN: [id number] DOB:09-15-1945, 76 y.o., male Today's Date: 08/03/2021  PCP: Dr. Ria Bush  REFERRING PROVIDER: Dr. Ria Bush   END OF SESSION:   PT End of Session - 08/03/21 1722     Visit Number 3    Number of Visits 16    Date for PT Re-Evaluation 09/05/21    PT Start Time 2595    PT Stop Time 1800    PT Time Calculation (min) 40 min    Activity Tolerance Patient limited by pain    Behavior During Therapy [facility] for tasks assessed/performed             Past Medical History:  Diagnosis Date   BPH (benign prostatic hypertrophy)    GERD (gastroesophageal reflux disease)    Hemorrhoids    Hypertension    Past Surgical History:  Procedure Laterality Date   CATARACT EXTRACTION Bilateral 2018   Dr Clent Jacks   CHOLECYSTECTOMY  8/14   Dr Pat Patrick   COLONOSCOPY  03/2013   sessile serrated polyp, diverticulosis, rpt 5 yrs (Pyrtle)   COLONOSCOPY  10/20/2019   TA, SSP, severe diverticulosis, grade IV hem ([facility]le)   UPPER GASTROINTESTINAL ENDOSCOPY  10/20/2019   Barrett's, 2cm HH (Pyrtle)   Patient Active Problem List   Diagnosis Date Noted   Neck pain 08/02/2021   Femoroacetabular impingement of left hip 06/02/2021   Barrett esophagus 02/02/2020   Thrombocytopenia ([place]) 01/23/2020   Branch retinal vein occlusion of left eye 08/11/2019   Left retinoschisis 08/11/2019   Retinal microaneurysm of left eye 08/11/2019   Medicare annual wellness visit, subsequent 12/25/2018   Microhematuria 12/25/2018   Right shoulder pain 03/10/2017   Hypertension, essential 12/12/2016   Eye problem 09/20/2016   Vitiligo 08/27/2016   Benign skin growth 02/16/2016   Dyslipidemia 02/16/2016   Advanced care planning/counseling discussion 02/16/2016   Benign prostatic hyperplasia    Routine general medical examination at a health care facility 08/28/2010   GERD 09/02/2007    REFERRING DIAG:  REFERRING DIAG: M25.852 (ICD-10-CM) - Femoroacetabular impingement of left hip  THERAPY DIAG:  Pain in left hip  Difficulty in walking, not elsewhere classified  Rationale for Evaluation and Treatment Rehabilitation  PERTINENT HISTORY: N/a  PRECAUTIONS: None   SUBJECTIVE: Pt reports seeing PCP for ongoing cervical pain and that he received X-rays. He requested order for PT for cervical neck and PCP waiting for results to send PT referral.   PAIN:  Are you having pain? Yes: NPRS scale: 3 to 4/10 Pain location: Occiput neck pain  Pain description: Sharp  Aggravating factors: turning neck  Relieving factors: Sitting and using Ibuprofen   OBJECTIVE       VITALS: BP 156/67 HR 73 SpO2 99   DIAGNOSTIC FINDINGS:    CLINICAL DATA:  Left groin pain.  Evaluate for hip osteoarthritis.   EXAM: DG HIP (WITH OR WITHOUT PELVIS) 2-3V LEFT   COMPARISON:  None   FINDINGS::  The bilateral femoroacetabular joint spaces are maintained. There is moderate convexity of the anterior superior left femoral head-neck junction that increases risk for CAM-type femoroacetabular impingement. Minimal 1 mm cortical step-off in this region on lateral view is favored to be secondary to bone overlap.   There are mild-to-moderate subchondral degenerative cystic changes within the this region of the anterior superior left femoral head-neck junction consistent with CAM-type femoroacetabular impingement.   Minimal inferior left sacroiliac joint subchondral sclerosis degenerative change. The  pubic symphysis joint space is maintained. No dislocation.   Moderate stool throughout the colon. Mild retained oral contrast within diverticula within the descending colon and proximal sigmoid colon.   IMPRESSION:: IMPRESSION: 1. No significant arthritis within either hip. 2. There is a CAM-type bump deformity within the anterior superior left femoral head-neck junction that increases risk for  CAM-type femoroacetabular impingement.   PATIENT SURVEYS:  FOTO 57/72   COGNITION:           Overall cognitive status:  Forgot incident of fall indicating some cognitive deficits                            SENSATION: WFL   MUSCLE LENGTH: Hamstrings: Right 90 deg; Left 90 deg Thomas test: Positive Bilateral    POSTURE: No Significant postural limitations   PALPATION: Left groin    LOWER EXTREMITY ROM:                        Active  Right  Left  Hip flexion 120 120  Hip extension 30 30  Hip abduction 45 45  Hip adduction 30 30  Hip internal rotation 45 45  Hip external rotation 45 45  Knee flexion 135 135  Knee extension 0 0  Ankle dorsiflexion 20 20  Ankle plantarflexion 50 50  Ankle inversion 35 35  Ankle eversion 15 15                                               (Blank rows = not tested)   LOWER EXTREMITY MMT:   MMT Right eval Left eval  Hip flexion 5 5  Hip extension 3+ 3+  Hip abduction 4+ 4+  Hip adduction 4 3+  Hip internal rotation      Hip external rotation      Knee flexion 5 5  Knee extension 5 5  Ankle dorsiflexion 5 5  Ankle plantarflexion      Ankle inversion      Ankle eversion       (Blank rows = not tested)              LOWER EXTREMITY SPECIAL TESTS:  Hip special tests: Saralyn Pilar (FABER) test: negative, Thomas test: positive , Ober's test: negative, Ely's test: positive LLE, and Hip scouring test: negative, FADIR: negative    FUNCTIONAL TESTS:  5 times sit to stand: NT  30 seconds chair stand test   GAIT: Distance walked: 25 ft  Assistive device utilized: None Level of assistance: Complete Independence Comments: Antalgic gait with decreased stance time on LLE and decreased step length with RLE        TODAY'S TREATMENT: 08/03/21  Nu Step Seat and Arms 10 with 1 resistance for 7 min   Cervical MMT  Flexion 5 Extension 5* Rot R/L 5/5  Sidebend R/L 5*/5*   Cervical Rot  Snag 3 x 10 Cervical Ext Snag 3 x 10  Prone  mobilizations of central spinous processes C1-C7 grade II to III  -hypomobile   Left transverse process on C5 TTP   Education about potential for cervical arthritis   08/01/21  Nu Step Seat 9 and Arms 9 with 0 resistance   No to dysphagia, diplopia, dysarthria, nystagmus or nausea   Cervical ROM  -Rotation R/L 40/40  -  Lateral Flex R/L 20/20  -Flexion 45 -Ext 45   Upper Trap Stretch R/L 4 x 60 sec    MANUAL THERAPY   Upper Trap soft tissue massage and trigger massage  Cervical distraction   07/13/21            Nu-Step Seat 11 with level 2 resistance   5 Times Sit to Stand: 16 sec  (Individuals with times that exceed the listed time have worse than average performance - predictive of recurrent falls in healthy community-living subjects         - 70-79 y.o. 12.6 sec             30 sec chair stands= 12 reps *70-74 males <12 signifies an increased risk for falls             Side lying Hip Abduction 3 x 10  -mod VC and TC to adjust hips from rotating forward and to extend leg backward to avoid compensating with hip flexion  Sitting Ball Squeeze with Hip Adduction Isometric with 3 sec hold 1 x 10  -Pt reports increased left groin pain that leads him to self-terminate exercise   Supine Bridges with Ball Squeeze 3 x 10   Standing Hip Adduction with Sliders 3 x 10  Initial   Prone Quad Stretch 2 x 30 sec on LLE  Supine Bridges 3 x 10      PATIENT EDUCATION:  Education details: form and technique for appropriate exercise and education about underlying pathology. Differentials for cervical pathology and explanation of underlying cervical anatomy  Person educated: Patient and Spouse Education method: Explanation, Demonstration, Verbal cues, and Handouts Education comprehension: verbalized understanding, returned demonstration, verbal cues required, and tactile cues required     HOME EXERCISE PROGRAM: Access Code: [email or web address] URL: [email or web address] Date:  08/03/2021 Prepared by: Bradly Chris  Exercises - Prone Quadriceps Stretch with Strap  - 1 x daily - 7 x weekly - 1 sets - 3 reps - 60 hold - Sidelying Hip Abduction at [place]  - 1 x daily - 3 x weekly - 3 sets - 10 reps - Supine Piriformis Stretch  - 1 x daily - 7 x weekly - 1 sets - 3 reps - 60 hold - Supine Bridge with Mini Swiss Ball Between Knees  - 1 x daily - 3 x weekly - 3 sets - 10 reps - Standing Hip Adduction with Counter Support  - 1 x daily - 3 x weekly - 3 sets - 10 reps - Seated Assisted Cervical Rotation with Towel  - 1 x daily - 7 x weekly - 3 sets - 10 reps - Upper Cervical Extension SNAG with Strap  - 1 x daily - 7 x weekly - 3 sets - 10 reps   ASSESSMENT:   CLINICAL IMPRESSION: Pt exhibits continued pain with cervical movement especially with extension and lateral side bending. He recently saw PCP about pain and received X-rays but impressions have not been posted to medical record at this point. Pt able to perform cervical snags to increase cervical rot and ext without experiencing an increase in his pain. Educated patient on treatment for cervical arthritis and hip impingement. Unable to address hip because of timing and will spend next session focusing on hip.    He will continue to benefit from skilled PT to reduce left hip pain and to increased left hip strength to maintain mobility in order to maintain quality of life by participating in volunteering at [employer] back walking  and lifting food boxes and to decrease caregiver burden.    OBJECTIVE IMPAIRMENTS Abnormal gait, decreased endurance, difficulty walking, decreased strength, impaired flexibility, and pain.    ACTIVITY LIMITATIONS squatting, stairs, transfers, and locomotion level   PARTICIPATION LIMITATIONS: cleaning, shopping, community activity, and yard work   [place] Age and Time since onset of injury/illness/exacerbation are also affecting patient's functional outcome.    REHAB POTENTIAL: Good    CLINICAL DECISION MAKING: Stable/uncomplicated   EVALUATION COMPLEXITY: Low     GOALS: Goals reviewed with patient? No   SHORT TERM GOALS: Target date: 07/25/2021    Pt will be independent with HEP in order to improve strength and balance in order to decrease fall risk and improve function at home and work. Baseline: NT  Goal status: INITIAL       LONG TERM GOALS: Target date: 09/05/2021    Patient will have improved function and activity level as evidenced by an increase in FOTO score by 10 points or more.  Baseline: 57/72 Goal status: INITIAL   2.  Patient will improve gait mechanisms to achieve symmetrical stance time and step length between RLE and LLE for increased stability while ambulating.  Baseline: Decreased stance time on LLE and step length on RLE  Goal status: INITIAL   3.  Patient will demonstrate increase in hip MMT by 1 grade for increased hip stability to better tolerate load placed on hip in weight bearing and non-weight bearing positions.  Baseline:  Goal status: INITIAL   4.  Pt will decrease 5TSTS by at least 3 seconds in order to demonstrate clinically significant improvement in LE strength Baseline: NT  Goal status: INITIAL   5.  Patient will increase the number of chair stands to >=12 reps to improve her/his LE endurance and to reduce his risk of falls.    Baseline: NT  Goal status: INITIAL   PLAN: PT FREQUENCY: 1-2x/week   PT DURATION: 8 weeks   PLANNED INTERVENTIONS: Therapeutic exercises, Therapeutic activity, Neuromuscular re-education, Balance training, Gait training, Patient/Family education, Joint manipulation, Joint mobilization, Stair training, DME instructions, Aquatic Therapy, Dry Needling, Electrical stimulation, Spinal manipulation, Spinal mobilization, Cryotherapy, Moist heat, Manual therapy, and Re-evaluation   PLAN FOR NEXT SESSION:  gait analysis, self hip mobilizations, manual for hip mobilizations   Bradly Chris PT, DPT   Daneil Dan, PT 08/03/2021, 5:23 PM

## 2021-08-03 NOTE — Assessment & Plan Note (Addendum)
3 wks of moderate to severe neck pain without neurological symptoms or significant red flags besides severity of pain.  Will treat as cervical neck strain/spasm with robaxin muscle relaxant course, continue ibuprofen and heating pad. Provided with gentle stretching exercises from SM pt advisor.  If not improving with treatment, low threshold to further image neck including flexion/extension views and if xrays ok, will refer to PT for neck as well as L hip.

## 2021-08-03 NOTE — Telephone Encounter (Signed)
Plz call for update on how he did last night with robaxin muscle relaxant.  If it didn't help, recommend he come in for neck xrays and if normal then I will refer back to PT to work on hip and neck.

## 2021-08-03 NOTE — Telephone Encounter (Addendum)
Spoke with pt's wife, Shea Evans (on dpr), asking how pt did with Robaxin.  Says per pt, it had no affect on the pain.  He to one this morning and still no help.  I relayed Dr. Synthia Innocent message.  Verbalizes understanding and will inform pt to have him come in for x-rays.

## 2021-08-06 ENCOUNTER — Other Ambulatory Visit: Payer: Self-pay | Admitting: Family Medicine

## 2021-08-06 ENCOUNTER — Encounter: Payer: Self-pay | Admitting: Family Medicine

## 2021-08-06 DIAGNOSIS — M5030 Other cervical disc degeneration, unspecified cervical region: Secondary | ICD-10-CM

## 2021-08-06 DIAGNOSIS — E1169 Type 2 diabetes mellitus with other specified complication: Secondary | ICD-10-CM

## 2021-08-06 MED ORDER — PREDNISONE 20 MG PO TABS
ORAL_TABLET | ORAL | 0 refills | Status: DC
  Filled 2021-08-06: qty 10, 7d supply, fill #0

## 2021-08-07 ENCOUNTER — Telehealth: Payer: Self-pay | Admitting: Physical Therapy

## 2021-08-07 ENCOUNTER — Other Ambulatory Visit: Payer: PPO

## 2021-08-07 ENCOUNTER — Other Ambulatory Visit (HOSPITAL_COMMUNITY): Payer: Self-pay

## 2021-08-07 MED ORDER — PREDNISONE 20 MG PO TABS: ORAL_TABLET | ORAL | 0 refills | Status: DC

## 2021-08-07 NOTE — Telephone Encounter (Signed)
Called pt to inquiry about future PT apts given recent imaging findings. Pt reports that he would like to cancel the next two appointments to see how prednisone affects him and to get into to see a spine specialist for further evaluation and treatment of his cervical spine pain.

## 2021-08-08 ENCOUNTER — Ambulatory Visit: Payer: PPO | Admitting: Physical Therapy

## 2021-08-09 ENCOUNTER — Encounter: Payer: PPO | Admitting: Family Medicine

## 2021-08-10 ENCOUNTER — Ambulatory Visit: Payer: PPO | Admitting: Physical Therapy

## 2021-08-15 ENCOUNTER — Ambulatory Visit: Payer: PPO | Admitting: Physical Therapy

## 2021-08-16 ENCOUNTER — Telehealth: Payer: Self-pay

## 2021-08-16 ENCOUNTER — Other Ambulatory Visit (HOSPITAL_COMMUNITY): Payer: Self-pay

## 2021-08-16 ENCOUNTER — Other Ambulatory Visit: Payer: Self-pay | Admitting: Family Medicine

## 2021-08-16 DIAGNOSIS — Z800 Family history of malignant neoplasm of digestive organs: Secondary | ICD-10-CM

## 2021-08-16 DIAGNOSIS — K219 Gastro-esophageal reflux disease without esophagitis: Secondary | ICD-10-CM

## 2021-08-16 DIAGNOSIS — Z86010 Personal history of colonic polyps: Secondary | ICD-10-CM

## 2021-08-16 MED ORDER — PANTOPRAZOLE SODIUM 40 MG PO TBEC
40.0000 mg | DELAYED_RELEASE_TABLET | Freq: Every day | ORAL | 0 refills | Status: DC
  Filled 2021-08-16: qty 90, 90d supply, fill #0

## 2021-08-16 NOTE — Telephone Encounter (Signed)
Pt wife called back and said that she talked to Dr Letta Pate office and they told her it would take about 2 weeks for them to review the order and get back to them if the could take him, and even if they can take him it would be out in August. Patient wife said they would need to do something to help him until then. Please advise. Call back is [phone]

## 2021-08-16 NOTE — Telephone Encounter (Addendum)
Don't recommend second course of prednisone. That was a duplicate.  Do they have appt with Dr Letta Pate yet?

## 2021-08-16 NOTE — Telephone Encounter (Signed)
Received a call from patients wife who states Jonathon Chavez has finished the prednisone, while on it his pain was at a level 5 which was tolerable for him but now that he has finished the prednisone his pain is back up to a 10. There is another prescription of Prednisone at the pharmacy and wants to know if he can take it knowing they are waiting on Dr.Kristin to respond back.

## 2021-08-16 NOTE — Telephone Encounter (Signed)
Patient's wife [name] notified as instructed by telephone and verbalized understanding. Patient's wife stated that they called for the appointment and is waiting to hear back from Dr. Letta Pate office about the appointment. Advised patient's wife that she should call the office back and find out the status of the appointment that needs to be scheduled.

## 2021-08-17 ENCOUNTER — Encounter: Payer: PPO | Admitting: Physical Therapy

## 2021-08-18 ENCOUNTER — Encounter: Payer: Self-pay | Admitting: Physical Medicine & Rehabilitation

## 2021-08-18 MED ORDER — TRAMADOL HCL 50 MG PO TABS: 50.0000 mg | ORAL_TABLET | Freq: Two times a day (BID) | ORAL | 0 refills | Status: DC | PRN

## 2021-08-18 NOTE — Telephone Encounter (Signed)
See today's phone note.  

## 2021-08-18 NOTE — Telephone Encounter (Addendum)
Spoke with wife to discuss treatment options while he gets in to see PM&R.  Rx tramadol '50mg'$  BID PRN #30. Update with effect.  Discussed this is controlled substance and possible side effects.  Ok to take 2nd prednisone course if tramadol ineffective.

## 2021-08-18 NOTE — Addendum Note (Signed)
Addended by: Ria Bush on: 08/18/2021 07:32 AM   Modules accepted: Orders

## 2021-08-21 MED ORDER — HYDROCODONE-ACETAMINOPHEN 5-325 MG PO TABS: 0.5000 | ORAL_TABLET | Freq: Two times a day (BID) | ORAL | 0 refills | Status: DC | PRN

## 2021-08-21 NOTE — Telephone Encounter (Signed)
[place] CSRS reviewed. Tramadol sedating and ineffective for pain.  Trial hydrocodone, + prednisone taper.

## 2021-08-21 NOTE — Addendum Note (Signed)
Addended by: Ria Bush on: 08/21/2021 05:49 PM   Modules accepted: Orders

## 2021-08-24 ENCOUNTER — Encounter: Payer: PPO | Admitting: Physical Therapy

## 2021-08-29 ENCOUNTER — Encounter: Payer: PPO | Admitting: Physical Therapy

## 2021-08-31 ENCOUNTER — Encounter: Payer: PPO | Admitting: Physical Therapy

## 2021-09-01 MED ORDER — PREDNISONE 50 MG PO TABS: 50.0000 mg | ORAL_TABLET | Freq: Every day | ORAL | 0 refills | Status: DC

## 2021-09-01 MED ORDER — HYDROCODONE-ACETAMINOPHEN 5-325 MG PO TABS: 0.5000 | ORAL_TABLET | Freq: Three times a day (TID) | ORAL | 0 refills | Status: DC | PRN

## 2021-09-01 NOTE — Addendum Note (Signed)
Addended by: Ria Bush on: 09/01/2021 05:57 PM   Modules accepted: Orders

## 2021-09-01 NOTE — Telephone Encounter (Signed)
Please schedule patient for Monday at 12 noon.

## 2021-09-01 NOTE — Telephone Encounter (Signed)
error 

## 2021-09-01 NOTE — Telephone Encounter (Signed)
Spoke with patient/wife. Significant improvement while on prednisone taper.  Ongoing posterior neck pain with radiation to occiput.  His headache is now radiation to bilateral temples as well as foggy peripherals which started yesterday. Pain in bilateral jaw when chewing.  No shoulder pain/stiffness or hip girdle pain.  No fevers, nausea, malaise.   Concern for GCA with recent new symptoms - will start prednisone '50mg'$  daily and have him come into office Monday for evaluation.

## 2021-09-04 ENCOUNTER — Encounter: Payer: Self-pay | Admitting: Family Medicine

## 2021-09-04 ENCOUNTER — Ambulatory Visit (INDEPENDENT_AMBULATORY_CARE_PROVIDER_SITE_OTHER): Payer: PPO | Admitting: Family Medicine

## 2021-09-04 VITALS — BP 124/72 | HR 77 | Temp 97.7°F | Ht 73.0 in | Wt 183.0 lb

## 2021-09-04 DIAGNOSIS — M5030 Other cervical disc degeneration, unspecified cervical region: Secondary | ICD-10-CM | POA: Diagnosis not present

## 2021-09-04 DIAGNOSIS — M2669 Other specified disorders of temporomandibular joint: Secondary | ICD-10-CM | POA: Diagnosis not present

## 2021-09-04 DIAGNOSIS — H539 Unspecified visual disturbance: Secondary | ICD-10-CM

## 2021-09-04 DIAGNOSIS — R519 Headache, unspecified: Secondary | ICD-10-CM | POA: Diagnosis not present

## 2021-09-04 DIAGNOSIS — M316 Other giant cell arteritis: Secondary | ICD-10-CM | POA: Insufficient documentation

## 2021-09-04 LAB — CBC WITH DIFFERENTIAL/PLATELET
Basophils Absolute: 0 10*3/uL (ref 0.0–0.1)
Basophils Relative: 0.2 % (ref 0.0–3.0)
Eosinophils Absolute: 0 10*3/uL (ref 0.0–0.7)
Eosinophils Relative: 0.1 % (ref 0.0–5.0)
HCT: 37.9 % — ABNORMAL LOW (ref 39.0–52.0)
Hemoglobin: 13 g/dL (ref 13.0–17.0)
Lymphocytes Relative: 7.9 % — ABNORMAL LOW (ref 12.0–46.0)
Lymphs Abs: 0.7 10*3/uL (ref 0.7–4.0)
MCHC: 34.4 g/dL (ref 30.0–36.0)
MCV: 84.9 fl (ref 78.0–100.0)
Monocytes Absolute: 0.1 10*3/uL (ref 0.1–1.0)
Monocytes Relative: 1.5 % — ABNORMAL LOW (ref 3.0–12.0)
Neutro Abs: 7.9 10*3/uL — ABNORMAL HIGH (ref 1.4–7.7)
Neutrophils Relative %: 90.3 % — ABNORMAL HIGH (ref 43.0–77.0)
Platelets: 218 10*3/uL (ref 150.0–400.0)
RBC: 4.46 Mil/uL (ref 4.22–5.81)
RDW: 13.3 % (ref 11.5–15.5)
WBC: 8.7 10*3/uL (ref 4.0–10.5)

## 2021-09-04 LAB — COMPREHENSIVE METABOLIC PANEL
ALT: 16 U/L (ref 0–53)
AST: 15 U/L (ref 0–37)
Albumin: 4.1 g/dL (ref 3.5–5.2)
Alkaline Phosphatase: 38 U/L — ABNORMAL LOW (ref 39–117)
BUN: 23 mg/dL (ref 6–23)
CO2: 26 mEq/L (ref 19–32)
Calcium: 9.4 mg/dL (ref 8.4–10.5)
Chloride: 101 mEq/L (ref 96–112)
Creatinine, Ser: 0.93 mg/dL (ref 0.40–1.50)
GFR: 80.95 mL/min (ref >60.00)
Glucose, Bld: 161 mg/dL — ABNORMAL HIGH (ref 70–99)
Potassium: 4.6 mEq/L (ref 3.5–5.1)
Sodium: 137 mEq/L (ref 135–145)
Total Bilirubin: 0.4 mg/dL (ref 0.2–1.2)
Total Protein: 7.1 g/dL (ref 6.0–8.3)

## 2021-09-04 LAB — C-REACTIVE PROTEIN: CRP: 1.3 mg/dL (ref 0.5–20.0)

## 2021-09-04 LAB — SEDIMENTATION RATE: Sed Rate: 56 mm/hr — ABNORMAL HIGH (ref 0–20)

## 2021-09-04 LAB — TSH: TSH: 0.66 u[IU]/mL (ref 0.35–5.50)

## 2021-09-04 NOTE — Telephone Encounter (Signed)
Pt scheduled  

## 2021-09-04 NOTE — Progress Notes (Signed)
Patient ID: Jonathon Chavez, male    DOB: October 18, 1945, 76 y.o.   MRN: [id number]  This visit was conducted in person.  BP 124/72   Pulse 77   Temp 97.7 F (36.5 C) (Temporal)   Ht '6\' 1"'$  (1.854 m)   Wt 183 lb (83 kg)   SpO2 95%   BMI 24.14 kg/m    CC: headache, neck pain  Subjective:   HPI: Jonathon Chavez is a 76 y.o. male presenting on 09/04/2021 for Headache (C/o ongoing HA and neck pain.  Pt accompanied by wife, [name]. )   See prior note for details   Symptoms started with L groin pain ~05/2021. Workup included CT scan, hip xrays. Thought possible groin strain vs hip OA vs femoroacetabular impingement - referred to PT without much benefit.   In interim, saw [facility] end of May with L lung PNA treated with amoxicillin and azithromycin.   Seen again 07/2021 - with new onset neck pain radiating into occiput leading to occipital headache, worse when laying on back, with radiation to R posterior shoulder.  Cervical neck xrays with flexion/extension 08/03/2021 showed advanced disc and facet degeneration with multilevel listhesis and neural foraminal narrowing (potentially severe) - referred to PM&R, appt scheduled late 09/2021.  Treated with prednisone 7d taper x2 ('40mg'$ /'20mg'$ ) with significant improvement each time while on prednisone as well as hydrocodone for breakthrough pain.   Notes ongoing posterior neck pain with radiation to occiput worse off prednisone, but over the past 3-4 days has started having new radiation of pain to temples, foggy vision in periphery, and bilateral jaw pain when chewing. Denies shoulder pain/stiffness or hip girdle pain. No fevers/chills, nausea, malaise otherwise.  On 7/15, prednisone '50mg'$  daily course was started given concern for possible temporal arteritis given above symptoms.   Prior to starting prednisone, had ongoing severe posterior neck pain predominantly R of midline, bilateral shoulder pain and stiffness, as well as headache from occiput into  bilateral temples.  Prednisone has helped resolve L groin pain.  He's lost 10 lbs in last few months, had been intentionally cutting down sugars, has also not felt well.  He's felt overall well since starting prednisone.   No h/o glaucoma.  He sees Dr Morene Crocker ([facility]).      Relevant past medical, surgical, family and social history reviewed and updated as indicated. Interim medical history since our last visit reviewed. Allergies and medications reviewed and updated. Outpatient Medications Prior to Visit  Medication Sig Dispense Refill   Ascorbic Acid (VITAMIN C) 1000 MG tablet Take 1,000 mg by mouth daily.     HYDROcodone-acetaminophen (NORCO/VICODIN) 5-325 MG tablet Take 0.5-1 tablets by mouth 3 (three) times daily as needed for moderate pain. 15 tablet 0   losartan (COZAAR) 50 MG tablet TAKE ONE TABLET BY MOUTH DAILY 90 tablet 0   pantoprazole (PROTONIX) 40 MG tablet Take 1 tablet by mouth daily. 90 tablet 0   predniSONE (DELTASONE) 50 MG tablet Take 1 tablet (50 mg total) by mouth daily with breakfast. 30 tablet 0   methocarbamol (ROBAXIN) 500 MG tablet Take 1 tablet (500 mg total) by mouth 3 (three) times daily as needed for muscle spasms (sedation precations). 30 tablet 0   No facility-administered medications prior to visit.     Per HPI unless specifically indicated in ROS section below Review of Systems  Objective:  BP 124/72   Pulse 77   Temp 97.7 F (36.5 C) (Temporal)   Ht '6\' 1"'$  (1.854  m)   Wt 183 lb (83 kg)   SpO2 95%   BMI 24.14 kg/m   Wt Readings from Last 3 Encounters:  09/04/21 183 lb (83 kg)  08/02/21 187 lb 2 oz (84.9 kg)  06/26/21 193 lb (87.5 kg)      Physical Exam Vitals and nursing note reviewed.  Constitutional:      Appearance: Normal appearance. He is not ill-appearing.  HENT:     Head: Normocephalic and atraumatic.     Comments:  No temporal pain to palpation No TMJ pain to palpation     Right Ear: Tympanic membrane, ear canal and  external ear normal. There is no impacted cerumen.     Left Ear: Tympanic membrane, ear canal and external ear normal. There is no impacted cerumen.     Mouth/Throat:     Mouth: Mucous membranes are moist.     Pharynx: Oropharynx is clear. No oropharyngeal exudate or posterior oropharyngeal erythema.  Eyes:     Extraocular Movements: Extraocular movements intact.     Conjunctiva/sclera: Conjunctivae normal.     Pupils: Pupils are equal, round, and reactive to light.     Comments: Did not appreciate papilledema on limited fundoscopic exam  Neck:     Comments: Stiff movements of neck  Cardiovascular:     Rate and Rhythm: Normal rate and regular rhythm.     Pulses: Normal pulses.     Heart sounds: Normal heart sounds. No murmur heard. Pulmonary:     Effort: Pulmonary effort is normal. No respiratory distress.     Breath sounds: Normal breath sounds. No wheezing, rhonchi or rales.  Musculoskeletal:     Cervical back: Normal range of motion and neck supple.     Right lower leg: No edema.     Left lower leg: No edema.  Lymphadenopathy:     Cervical: No cervical adenopathy.  Skin:    General: Skin is warm and dry.     Findings: No rash.     Comments: Vitiligo  Neurological:     Mental Status: He is alert.  Psychiatric:        Mood and Affect: Mood normal.        Behavior: Behavior normal.       Results for orders placed or performed in visit on 06/02/21  Comprehensive metabolic panel  Result Value Ref Range   Sodium 137 135 - 145 mEq/L   Potassium 4.0 3.5 - 5.1 mEq/L   Chloride 102 96 - 112 mEq/L   CO2 29 19 - 32 mEq/L   Glucose, Bld 118 (H) 70 - 99 mg/dL   BUN 25 (H) 6 - 23 mg/dL   Creatinine, Ser 0.94 0.40 - 1.50 mg/dL   Total Bilirubin 0.8 0.2 - 1.2 mg/dL   Alkaline Phosphatase 51 39 - 117 U/L   AST 19 0 - 37 U/L   ALT 19 0 - 53 U/L   Total Protein 7.6 6.0 - 8.3 g/dL   Albumin 4.3 3.5 - 5.2 g/dL   GFR 80.06 >60.00 mL/min   Calcium 9.6 8.4 - 10.5 mg/dL  CBC with  Differential/Platelet  Result Value Ref Range   WBC 4.3 4.0 - 10.5 K/uL   RBC 4.70 4.22 - 5.81 Mil/uL   Hemoglobin 13.9 13.0 - 17.0 g/dL   HCT 39.9 39.0 - 52.0 %   MCV 84.8 78.0 - 100.0 fl   MCHC 34.7 30.0 - 36.0 g/dL   RDW 13.1 11.5 - 15.5 %  Platelets 174.0 150.0 - 400.0 K/uL   Neutrophils Relative % 58.1 43.0 - 77.0 %   Lymphocytes Relative 29.8 12.0 - 46.0 %   Monocytes Relative 8.6 3.0 - 12.0 %   Eosinophils Relative 2.7 0.0 - 5.0 %   Basophils Relative 0.8 0.0 - 3.0 %   Neutro Abs 2.5 1.4 - 7.7 K/uL   Lymphs Abs 1.3 0.7 - 4.0 K/uL   Monocytes Absolute 0.4 0.1 - 1.0 K/uL   Eosinophils Absolute 0.1 0.0 - 0.7 K/uL   Basophils Absolute 0.0 0.0 - 0.1 K/uL   No results found for: "ESRSEDRATE", "POCTSEDRATE"   Assessment & Plan:   Problem List Items Addressed This Visit     DDD (degenerative disc disease), cervical    Known advanced cervical osteoarthritis changes with potentially severe neural foraminal narrowing however without significant radiculitis symptoms. Has PM&R f/u scheduled end of August.       Temporal headache - Primary    New concerning significant bilateral temporal headache associated with vision changes (foggy vision to periphery) and jaw claudication, in setting of possible prior PMR.  Prednisone '50mg'$  started 09/02/2021.  Check inflammatory markers, refer to rheumatology and ophthalmology urgently for further evaluation/diagnosis of possible temporal arteritis.  Discussed all of this with patient as well as risks of long term prednisone use hence I want to try to formalize this diagnosis prior to committing him to long-term prednisone use.  Will closely monitor, has CPE scheduled in 2 wks.       Relevant Orders   Sedimentation rate   C-reactive protein   Comprehensive metabolic panel   CBC with Differential/Platelet   TSH   Ambulatory referral to Rheumatology   Ambulatory referral to Ophthalmology   Vision changes    Foggy peripheral vision without  double vision or sudden vision loss.  Refer urgently to ophtho for exam.       Relevant Orders   Ambulatory referral to Rheumatology   Ambulatory referral to Ophthalmology   Other Visit Diagnoses     Jaw claudication       Relevant Orders   Ambulatory referral to Rheumatology   Ambulatory referral to Ophthalmology        No orders of the defined types were placed in this encounter.  Orders Placed This Encounter  Procedures   Sedimentation rate   C-reactive protein   Comprehensive metabolic panel   CBC with Differential/Platelet   TSH   Ambulatory referral to Rheumatology    Referral Priority:   Urgent    Referral Type:   Consultation    Referral Reason:   Specialty Services Required    Requested Specialty:   Rheumatology    Number of Visits Requested:   1   Ambulatory referral to Ophthalmology    Referral Priority:   Urgent    Referral Type:   Consultation    Referral Reason:   Specialty Services Required    Requested Specialty:   Ophthalmology    Number of Visits Requested:   1    Patient instructions: I want you to see eye doctor as well as the rheumatologist.  We will call you for appointments. Labs today  Keep blood work for physical as well as 2 wk physical.   Follow up plan: No follow-ups on file.  Ria Bush, MD

## 2021-09-04 NOTE — Patient Instructions (Addendum)
I want you to see eye doctor as well as the rheumatologist.  We will call you for appointments. Labs today  Keep blood work for physical as well as 2 wk physical.   Temporal Arteritis  Temporal arteritis is a condition that causes arteries to become inflamed. It usually affects arteries in your head and face, but arteries in any part of the body can become inflamed. The condition is also called giant cell arteritis.  Temporal arteritis can cause serious problems such as blindness. Early treatment can help prevent these problems. What are the causes? The cause of this condition is not known. What increases the risk? The following factors may make you more likely to develop this condition: Being older than 50. Being a woman. Being Caucasian. Being of [place], [place], [place], [place] or [place] ancestry. Having a family history of the condition. Having a certain condition that causes muscle pain and stiffness (polymyalgia rheumatica, PMR). What are the signs or symptoms? Some people with temporal arteritis have just one symptom, while others have several symptoms. Most symptoms are related to the head and face. These may include: Headache. Hard, swollen, or tender temples. This is common. Your temples are the areas on either side of your forehead. Pain when combing your hair or when laying your head down. Pain in the jaw when chewing. Pain in the throat or tongue. Problems with your vision, such as sudden loss of vision in one eye, or seeing double. Other symptoms may include: Fever. Tiredness (fatigue). A dry cough. Pain in the hips and shoulders. Pain in the arms during exercise. Depression. Weight loss. How is this diagnosed? This condition may be diagnosed based on: Your symptoms. Your medical history. A physical exam. Tests, including: Blood tests. A test in which a tissue sample is removed from an artery so it can be examined (biopsy). Imaging tests, such as an  ultrasound or MRI. How is this treated? This condition may be treated with: A type of medicine to reduce inflammation (corticosteroid). Medicines to weaken your immune system (immunosuppressants). Other medicines to treat vision problems. You will need to see your health care provider while you are being treated. The medicines used to treat this condition can increase your risk of problems such as bone loss and diabetes. During follow-up visits, your health care provider will check for problems by: Doing blood tests and bone density tests. Checking your blood pressure and blood sugar. Follow these instructions at home: Medicines Take over-the-counter and prescription medicines only as told by your health care provider. Take any vitamins or supplements recommended by your health care provider. These may include vitamin D and calcium, which help keep your bones from becoming weak. Eating and drinking  Eat a heart-healthy diet. This may include: Eating high-fiber foods, such as fresh fruits and vegetables, whole grains, and beans. Eating heart-healthy fats (omega-3 fats), such as fish, flaxseed, and flaxseed oil. Limiting foods that are high in saturated fat and cholesterol, such as processed and fried foods, fatty meat, and full-fat dairy. Limiting how much salt (sodium) you eat. Include calcium and vitamin D in your diet. Good sources of calcium and vitamin D include: Low-fat dairy products such as milk, yogurt, and cheese. Certain fish, such as fresh or canned salmon, tuna, and sardines. Products that have calcium and vitamin D added to them (fortified products), such as fortified cereals or juice. General instructions Exercise. Talk with your health care provider about what exercises are okay for you. Exercises that increase your heart  rate (aerobic exercise), such as walking, are often recommended. Aerobic exercise helps control your blood pressure and prevent bone loss. Stay up to date  on all vaccines as directed by your health care provider. Keep all follow-up visits as told by your health care provider. This is important. Contact a health care provider if: Your symptoms get worse. You develop signs of infection, such as fever, swelling, redness, warmth, and tenderness. Get help right away if: You lose your vision. Your pain does not go away, even after you take medicine. You have chest pain. You have trouble breathing. One side of your face or body suddenly becomes weak or numb. These symptoms may represent a serious problem that is an emergency. Do not wait to see if the symptoms will go away. Get medical help right away. Call your local emergency services (911 in the [place] Do not drive yourself to the hospital. Summary Temporal arteritis is a condition that causes arteries to become inflamed. It usually affects arteries in your head and face. This condition can cause serious problems, such as blindness. Treatment can help prevent these problems. Symptoms may include hard or tender temples, pain in your jaw when chewing, problems with your vision, or pain in your hips and shoulders. Take over-the-counter and prescription medicines as told by your health care provider. This information is not intended to replace advice given to you by your health care provider. Make sure you discuss any questions you have with your health care provider. Document Revised: 04/19/2020 Document Reviewed: 04/19/2020 Elsevier Patient Education  [place] Polymyalgia rheumatica (PMR) is an inflammatory disorder that causes the muscles and joints to ache and become stiff. Sometimes, PMR leads to a more dangerous condition that can cause vision loss (temporal arteritis or giant cell arteritis). What are the causes? The exact cause of PMR is not known. What increases the risk? You are more likely to develop this condition if you are: Male. 68 years old or  older. Of [place] descent. What are the signs or symptoms? Pain and stiffness are the main symptoms of PMR and affect both sides of the body. These symptoms: May be worse after inactivity and in the morning. May affect your: Hips, buttocks, and thighs. Neck, arms, and shoulders. This can make it hard to raise your arms above your head. Hands and wrists. Other symptoms include: Fever. Tiredness. Weakness. Depression. Decreased appetite. This may lead to weight loss. Symptoms could start slowly but often come on suddenly, sometimes even overnight, or over a few days. How is this diagnosed? This condition is diagnosed with your medical history and a physical exam. You may need to see a health care provider who specializes in diseases of the joints, muscles, and bones (rheumatologist). You may also have tests, including: Blood tests. Often, test results that show inflammation are very elevated. Imaging studies like X-rays or ultrasound, which may be done to rule out other conditions. These are often usual in PMR. How is this treated? PMR usually goes away without treatment, but it may take years. PMR often responds rapidly (within a few days) to low-dose glucocorticoids (steroids). These medicines have serious side effects. Once your symptoms are better controlled, the dose should be lowered to find the lowest possible dose that controls your symptoms. Regular exercise and rest will also help your symptoms. Follow these instructions at home:  Take over-the-counter and prescription medicines only as told by your health care provider. Make sure to get enough  rest and sleep. Eat a healthy and nutritious diet that includes calcium and vitamin D. Calcium is important for bone health, and vitamin D helps your body absorb calcium. Good sources of calcium and vitamin D include: Certain fatty fish, such as salmon and tuna. Products that have calcium and vitamin D added to them (are  fortified), such as fortified cereals. Collard greens, turnip greens, broccoli, and kale. Egg yolks. Cheese. Liver. Try to exercise most days of the week. Ask your health care provider what type of exercises are best for you. Keep all follow-up visits. This is important. Contact a health care provider if: Your symptoms do not improve with medicine. You have side effects from steroids. These may include: Weight gain. Swelling. Insomnia. Mood changes. Bruising. High blood sugar readings, if you have diabetes. Higher than normal blood pressure readings, if you monitor your blood pressure. Get help right away if: You develop symptoms of temporal arteritis, such as: A change in vision. Severe headache. Scalp pain. Jaw pain. These symptoms may represent a serious problem that is an emergency. Do not wait to see if the symptoms will go away. Get medical help right away. Call your local emergency services (911 in the [place] Do not drive yourself to the hospital. Summary Polymyalgia rheumatica is an inflammatory disorder that causes aching and stiffness in your muscles and joints. The exact cause of this condition is not known. This condition usually goes away without treatment. Your health care provider may give you low-dose glucocorticoids (steroids) to help manage your pain and stiffness. Rest and regular exercise will help the symptoms. This information is not intended to replace advice given to you by your health care provider. Make sure you discuss any questions you have with your health care provider. Document Revised: 06/09/2020 Document Reviewed: 06/09/2020 Elsevier Patient Education  [place].

## 2021-09-04 NOTE — Assessment & Plan Note (Signed)
Known advanced cervical osteoarthritis changes with potentially severe neural foraminal narrowing however without significant radiculitis symptoms. Has PM&R f/u scheduled end of August.

## 2021-09-04 NOTE — Assessment & Plan Note (Addendum)
Foggy peripheral vision without double vision or sudden vision loss.  Refer urgently to ophtho for exam.

## 2021-09-04 NOTE — Assessment & Plan Note (Addendum)
New concerning significant bilateral temporal headache associated with vision changes (foggy vision to periphery) and jaw claudication, in setting of possible prior PMR.  Prednisone '50mg'$  started 09/02/2021.  Check inflammatory markers, refer to rheumatology and ophthalmology urgently for further evaluation/diagnosis of possible temporal arteritis.  Discussed all of this with patient as well as risks of long term prednisone use hence I want to try to formalize this diagnosis prior to committing him to long-term prednisone use.  Will closely monitor, has CPE scheduled in 2 wks.

## 2021-09-05 ENCOUNTER — Encounter: Payer: Self-pay | Admitting: *Deleted

## 2021-09-05 ENCOUNTER — Encounter: Payer: PPO | Admitting: Physical Therapy

## 2021-09-07 ENCOUNTER — Encounter: Payer: PPO | Admitting: Physical Therapy

## 2021-09-13 ENCOUNTER — Other Ambulatory Visit (INDEPENDENT_AMBULATORY_CARE_PROVIDER_SITE_OTHER): Payer: PPO

## 2021-09-13 DIAGNOSIS — R7309 Other abnormal glucose: Secondary | ICD-10-CM | POA: Diagnosis not present

## 2021-09-13 DIAGNOSIS — Z79899 Other long term (current) drug therapy: Secondary | ICD-10-CM | POA: Diagnosis not present

## 2021-09-13 DIAGNOSIS — N401 Enlarged prostate with lower urinary tract symptoms: Secondary | ICD-10-CM

## 2021-09-13 DIAGNOSIS — R3129 Other microscopic hematuria: Secondary | ICD-10-CM

## 2021-09-13 DIAGNOSIS — E785 Hyperlipidemia, unspecified: Secondary | ICD-10-CM | POA: Diagnosis not present

## 2021-09-13 DIAGNOSIS — R351 Nocturia: Secondary | ICD-10-CM

## 2021-09-13 DIAGNOSIS — D696 Thrombocytopenia, unspecified: Secondary | ICD-10-CM

## 2021-09-13 DIAGNOSIS — M316 Other giant cell arteritis: Secondary | ICD-10-CM | POA: Diagnosis not present

## 2021-09-13 LAB — URINALYSIS, ROUTINE W REFLEX MICROSCOPIC
Bilirubin Urine: NEGATIVE
Ketones, ur: NEGATIVE
Leukocytes,Ua: NEGATIVE
Nitrite: NEGATIVE
Specific Gravity, Urine: 1.015 (ref 1.000–1.030)
Total Protein, Urine: NEGATIVE
Urine Glucose: NEGATIVE
Urobilinogen, UA: 0.2 (ref 0.0–1.0)
WBC, UA: NONE SEEN (ref 0–?)
pH: 6.5 (ref 5.0–8.0)

## 2021-09-13 LAB — COMPREHENSIVE METABOLIC PANEL
ALT: 22 U/L (ref 0–53)
AST: 14 U/L (ref 0–37)
Albumin: 3.9 g/dL (ref 3.5–5.2)
Alkaline Phosphatase: 41 U/L (ref 39–117)
BUN: 28 mg/dL — ABNORMAL HIGH (ref 6–23)
CO2: 31 mEq/L (ref 19–32)
Calcium: 9.1 mg/dL (ref 8.4–10.5)
Chloride: 99 mEq/L (ref 96–112)
Creatinine, Ser: 0.99 mg/dL (ref 0.40–1.50)
GFR: 75.08 mL/min (ref >60.00)
Glucose, Bld: 126 mg/dL — ABNORMAL HIGH (ref 70–99)
Potassium: 4.2 mEq/L (ref 3.5–5.1)
Sodium: 136 mEq/L (ref 135–145)
Total Bilirubin: 0.5 mg/dL (ref 0.2–1.2)
Total Protein: 6.6 g/dL (ref 6.0–8.3)

## 2021-09-13 LAB — CBC WITH DIFFERENTIAL/PLATELET
Basophils Absolute: 0 10*3/uL (ref 0.0–0.1)
Basophils Relative: 0.1 % (ref 0.0–3.0)
Eosinophils Absolute: 0 10*3/uL (ref 0.0–0.7)
Eosinophils Relative: 0.1 % (ref 0.0–5.0)
HCT: 40.4 % (ref 39.0–52.0)
Hemoglobin: 13.7 g/dL (ref 13.0–17.0)
Lymphocytes Relative: 9.4 % — ABNORMAL LOW (ref 12.0–46.0)
Lymphs Abs: 1 10*3/uL (ref 0.7–4.0)
MCHC: 33.9 g/dL (ref 30.0–36.0)
MCV: 85.7 fl (ref 78.0–100.0)
Monocytes Absolute: 0.6 10*3/uL (ref 0.1–1.0)
Monocytes Relative: 5.4 % (ref 3.0–12.0)
Neutro Abs: 9.3 10*3/uL — ABNORMAL HIGH (ref 1.4–7.7)
Neutrophils Relative %: 85 % — ABNORMAL HIGH (ref 43.0–77.0)
Platelets: 177 10*3/uL (ref 150.0–400.0)
RBC: 4.71 Mil/uL (ref 4.22–5.81)
RDW: 13.7 % (ref 11.5–15.5)
WBC: 11 10*3/uL — ABNORMAL HIGH (ref 4.0–10.5)

## 2021-09-13 LAB — LIPID PANEL
Cholesterol: 208 mg/dL — ABNORMAL HIGH (ref 0–200)
HDL: 51 mg/dL (ref >39.00)
LDL Cholesterol: 141 mg/dL — ABNORMAL HIGH (ref 0–99)
NonHDL: 156.89
Total CHOL/HDL Ratio: 4
Triglycerides: 80 mg/dL (ref 0.0–149.0)
VLDL: 16 mg/dL (ref 0.0–40.0)

## 2021-09-13 LAB — HEMOGLOBIN A1C: Hgb A1c MFr Bld: 7.1 % — ABNORMAL HIGH (ref 4.6–6.5)

## 2021-09-13 LAB — PSA: PSA: 2.02 ng/mL (ref 0.10–4.00)

## 2021-09-13 NOTE — Addendum Note (Signed)
Addended by: Ellamae Sia on: 09/13/2021 08:17 AM   Modules accepted: Orders

## 2021-09-14 NOTE — Telephone Encounter (Signed)
This is a STAT referral. Please follow up to ensure appt scheduled within 1 wk. If not, please let me know if I need to speak with physician in specialty office to ensure he is evaluated within requested time frame.

## 2021-09-15 ENCOUNTER — Ambulatory Visit: Payer: Self-pay | Admitting: General Surgery

## 2021-09-15 DIAGNOSIS — M316 Other giant cell arteritis: Secondary | ICD-10-CM | POA: Diagnosis not present

## 2021-09-15 NOTE — H&P (Signed)
PATIENT PROFILE: Jonathon Chavez is a 76 y.o. male who presents to the Clinic for consultation at the request of Defoor, [place] for evaluation of temporal artery biopsy.  PCP:  Jonathon Bush, MD  HISTORY OF PRESENT ILLNESS: Jonathon Chavez reports he has been dealing with temporal headaches for the last month.  Initially he thought that the pain was coming from his neck.  He started treatment with prednisone by his primary care physician with improvement of the neck pain and headaches.  Once the prednisone was tapered he again started having bilateral temporal headaches.  In addition he has been having worsening bilateral blurry vision.  He also endorses jaw claudication and tightness.  He has had at least 3 courses of prednisone with improvement of the symptoms but with recurrence once he gets out of prednisone.  He cannot identify any aggravating factor.  Alleviating factor is prednisone.   PROBLEM LIST: Problem List  Date Reviewed: 09/13/2021  None   GENERAL REVIEW OF SYSTEMS:   General ROS: negative for - chills, fatigue, fever, weight gain or weight loss Allergy and Immunology ROS: negative for - hives  Hematological and Lymphatic ROS: negative for - bleeding problems or bruising, negative for palpable nodes Endocrine ROS: negative for - heat or cold intolerance, hair changes Respiratory ROS: negative for - cough, shortness of breath or wheezing Cardiovascular ROS: no chest pain or palpitations GI ROS: negative for nausea, vomiting, abdominal pain, diarrhea, constipation Musculoskeletal ROS: Positive for - joint swelling or muscle pain Neurological ROS: negative for - confusion, syncope.  Positive for headaches Dermatological ROS: negative for pruritus and rash Psychiatric: negative for anxiety, depression, difficulty sleeping and memory loss  MEDICATIONS: Current Outpatient Medications  Medication Sig Dispense Refill   ascorbic acid, vitamin C, (VITAMIN C) 1000 MG tablet Take 1,000 mg  by mouth once daily     HYDROcodone-acetaminophen (NORCO) 5-325 mg tablet Take 1 tablet by mouth every 6 (six) hours as needed for Pain     losartan (COZAAR) 50 MG tablet Take 50 mg by mouth once daily     methocarbamoL (ROBAXIN) 500 MG tablet Take 500 mg by mouth 3 (three) times daily as needed     pantoprazole (PROTONIX) 40 MG DR tablet Take 40 mg by mouth once daily     predniSONE (DELTASONE) 50 MG tablet Take 50 mg by mouth once daily     No current facility-administered medications for this visit.    ALLERGIES: Lisinopril  PAST MEDICAL HISTORY: Hypertension  PAST SURGICAL HISTORY: Past surgical history reviewed.  No pertinent past surgical history.  FAMILY HISTORY: Family History  Problem Relation Age of Onset   Arthritis Brother    Arthritis Paternal Grandmother      SOCIAL HISTORY: Social History   Socioeconomic History   Marital status: Married  Occupational History   Occupation: Retired  Tobacco Use   Smoking status: Never   Smokeless tobacco: Never  Vaping Use   Vaping Use: Never used    PHYSICAL EXAM: Vitals:   09/15/21 1024  BP: 110/63  Pulse: 77   Body mass index is 24.41 kg/m. Weight: 83.9 kg (185 lb)   GENERAL: Alert, active, oriented x3  HEENT: Pupils equal reactive to light. Extraocular movements are intact. Sclera clear. Palpebral conjunctiva normal red color.Pharynx clear.  Palpable bilateral temporal arteries.  NECK: Supple with no palpable mass and no adenopathy.  LUNGS: Sound clear with no rales rhonchi or wheezes.  HEART: Regular rhythm S1 and S2 without murmur.  ABDOMEN:  Soft and depressible, nontender with no palpable mass, no hepatomegaly.   EXTREMITIES: Well-developed well-nourished symmetrical with no dependent edema.  NEUROLOGICAL: Awake alert oriented, facial expression symmetrical, moving all extremities.  REVIEW OF DATA: I have reviewed the following data today: Initial consult on 09/13/2021  Component Date Value    Sedimentation Rate-Autom* 09/13/2021 12    C Reactive Protein - Lab* 09/13/2021 <1    Hepatitis B Surface Anti* 09/13/2021 Negative    Hep B Surface Ab, Qual -* 09/13/2021 Non Reactive    Hep B Core Total Ab - [place]* 09/13/2021 Negative    HCV Ab - [facility] 09/13/2021 Non Reactive    Interpretation: - [facility] 09/13/2021 Comment      ASSESSMENT: Jonathon Chavez is a 76 y.o. male presenting for consultation for temporal artery biopsy.  Patient referred from rheumatology for evaluation of temporal artery biopsy.  Patient with bilateral temporal headaches that has been resolving with prednisone but recurs when prednisone is tapered.  He has had elevated ESR.  He also has had bilateral blurry vision.  I discussed with patient the goal of the temporal artery biopsy.  This is not a treatment procedure but will give more information to his treatment team for further management.  I discussed with patient the risk of surgery that includes bleeding, infection, hematoma, pain, among others.  Patient understand the chances of false negative results due to current treatment of prednisone.  The patient report he understood and agreed to proceed.  Giant cell arteritis syndrome (CMS-HCC) [M31.6]  PLAN: Temporal artery biopsy (00941) Continue prednisone therapy as prescribed Avoid taking any blood thinner before the procedure Contact us if you have any concern.   Patient and his wife verbalized understanding, all questions were answered, and were agreeable with the plan outlined above.     Jonathon Pun, MD  Electronically signed by Jonathon Pun, MD

## 2021-09-15 NOTE — H&P (View-Only) (Signed)
PATIENT PROFILE: Graciano Batson is a 76 y.o. male who presents to the Clinic for consultation at the request of Def[place], [place] for evaluation of temporal artery biopsy.  PCP:  Ria Bush, MD  HISTORY OF PRESENT ILLNESS: Mr. Heitz reports he has been dealing with temporal headaches for the last month.  Initially he thought that the pain was coming from his neck.  He started treatment with prednisone by his primary care physician with improvement of the neck pain and headaches.  Once the prednisone was tapered he again started having bilateral temporal headaches.  In addition he has been having worsening bilateral blurry vision.  He also endorses jaw claudication and tightness.  He has had at least 3 courses of prednisone with improvement of the symptoms but with recurrence once he gets out of prednisone.  He cannot identify any aggravating factor.  Alleviating factor is prednisone.   PROBLEM LIST: Problem List  Date Reviewed: 09/13/2021  None   GENERAL REVIEW OF SYSTEMS:   General ROS: negative for - chills, fatigue, fever, weight gain or weight loss Allergy and Immunology ROS: negative for - hives  Hematological and Lymphatic ROS: negative for - bleeding problems or bruising, negative for palpable nodes Endocrine ROS: negative for - heat or cold intolerance, hair changes Respiratory ROS: negative for - cough, shortness of breath or wheezing Cardiovascular ROS: no chest pain or palpitations GI ROS: negative for nausea, vomiting, abdominal pain, diarrhea, constipation Musculoskeletal ROS: Positive for - joint swelling or muscle pain Neurological ROS: negative for - confusion, syncope.  Positive for headaches Dermatological ROS: negative for pruritus and rash Psychiatric: negative for anxiety, depression, difficulty sleeping and memory loss  MEDICATIONS: Current Outpatient Medications  Medication Sig Dispense Refill   ascorbic acid, vitamin C, (VITAMIN C) 1000 MG tablet Take 1,000 mg  by mouth once daily     HYDROcodone-acetaminophen (NORCO) 5-325 mg tablet Take 1 tablet by mouth every 6 (six) hours as needed for Pain     losartan (COZAAR) 50 MG tablet Take 50 mg by mouth once daily     methocarbamoL (ROBAXIN) 500 MG tablet Take 500 mg by mouth 3 (three) times daily as needed     pantoprazole (PROTONIX) 40 MG DR tablet Take 40 mg by mouth once daily     predniSONE (DELTASONE) 50 MG tablet Take 50 mg by mouth once daily     No current facility-administered medications for this visit.    ALLERGIES: Lisinopril  PAST MEDICAL HISTORY: Hypertension  PAST SURGICAL HISTORY: Past surgical history reviewed.  No pertinent past surgical history.  FAMILY HISTORY: Family History  Problem Relation Age of Onset   Arthritis Brother    Arthritis Paternal Grandmother      SOCIAL HISTORY: Social History   Socioeconomic History   Marital status: Married  Occupational History   Occupation: Retired  Tobacco Use   Smoking status: Never   Smokeless tobacco: Never  Vaping Use   Vaping Use: Never used    PHYSICAL EXAM: Vitals:   09/15/21 1024  BP: 110/63  Pulse: 77   Body mass index is 24.41 kg/m. Weight: 83.9 kg (185 lb)   GENERAL: Alert, active, oriented x3  HEENT: Pupils equal reactive to light. Extraocular movements are intact. Sclera clear. Palpebral conjunctiva normal red color.Pharynx clear.  Palpable bilateral temporal arteries.  NECK: Supple with no palpable mass and no adenopathy.  LUNGS: Sound clear with no rales rhonchi or wheezes.  HEART: Regular rhythm S1 and S2 without murmur.  ABDOMEN:  Soft and depressible, nontender with no palpable mass, no hepatomegaly.   EXTREMITIES: Well-developed well-nourished symmetrical with no dependent edema.  NEUROLOGICAL: Awake alert oriented, facial expression symmetrical, moving all extremities.  REVIEW OF DATA: I have reviewed the following data today: Initial consult on 09/13/2021  Component Date Value    Sedimentation Rate-Autom* 09/13/2021 12    C Reactive Protein - Lab* 09/13/2021 <1    Hepatitis B Surface Anti* 09/13/2021 Negative    Hep B Surface Ab, Qual -* 09/13/2021 Non Reactive    Hep B Core Total Ab - [place]* 09/13/2021 Negative    HCV Ab - [facility] 09/13/2021 Non Reactive    Interpretation: - [facility] 09/13/2021 Comment      ASSESSMENT: Mr. Serano is a 76 y.o. male presenting for consultation for temporal artery biopsy.  Patient referred from rheumatology for evaluation of temporal artery biopsy.  Patient with bilateral temporal headaches that has been resolving with prednisone but recurs when prednisone is tapered.  He has had elevated ESR.  He also has had bilateral blurry vision.  I discussed with patient the goal of the temporal artery biopsy.  This is not a treatment procedure but will give more information to his treatment team for further management.  I discussed with patient the risk of surgery that includes bleeding, infection, hematoma, pain, among others.  Patient understand the chances of false negative results due to current treatment of prednisone.  The patient report he understood and agreed to proceed.  Giant cell arteritis syndrome (CMS-HCC) [M31.6]  PLAN: Temporal artery biopsy (82574) Continue prednisone therapy as prescribed Avoid taking any blood thinner before the procedure Contact us if you have any concern.   Patient and his wife verbalized understanding, all questions were answered, and were agreeable with the plan outlined above.     Herbert Pun, MD  Electronically signed by Herbert Pun, MD

## 2021-09-18 ENCOUNTER — Ambulatory Visit: Payer: PPO | Admitting: Certified Registered Nurse Anesthetist

## 2021-09-18 ENCOUNTER — Other Ambulatory Visit: Payer: Self-pay

## 2021-09-18 ENCOUNTER — Encounter: Payer: Self-pay | Admitting: General Surgery

## 2021-09-18 ENCOUNTER — Encounter
Admission: RE | Disposition: A | Discharge status: Home / Self Care | Payer: Self-pay | Source: Home / Self Care | Attending: General Surgery

## 2021-09-18 ENCOUNTER — Ambulatory Visit
Admission: RE | Admit: 2021-09-18 | Discharge: 2021-09-18 | Disposition: A | Discharge status: Home / Self Care | Payer: PPO | Attending: General Surgery | Admitting: General Surgery

## 2021-09-18 DIAGNOSIS — M316 Other giant cell arteritis: Secondary | ICD-10-CM | POA: Insufficient documentation

## 2021-09-18 DIAGNOSIS — I10 Essential (primary) hypertension: Secondary | ICD-10-CM

## 2021-09-18 DIAGNOSIS — Z7952 Long term (current) use of systemic steroids: Secondary | ICD-10-CM | POA: Diagnosis not present

## 2021-09-18 DIAGNOSIS — I776 Arteritis, unspecified: Secondary | ICD-10-CM | POA: Diagnosis not present

## 2021-09-18 DIAGNOSIS — Z87891 Personal history of nicotine dependence: Secondary | ICD-10-CM | POA: Diagnosis not present

## 2021-09-18 HISTORY — PX: ARTERY BIOPSY: SHX891

## 2021-09-18 SURGERY — BIOPSY TEMPORAL ARTERY
Anesthesia: General | Site: Head

## 2021-09-18 MED ORDER — HYDROCORTISONE SOD SUC (PF) 100 MG IJ SOLR
INTRAMUSCULAR | Status: DC | PRN
  Administered 2021-09-18: 100 mg via INTRAVENOUS

## 2021-09-18 MED ORDER — FAMOTIDINE 20 MG PO TABS
ORAL_TABLET | ORAL | Status: DC
Start: 2021-09-18 — End: 2021-09-18
  Filled 2021-09-18: qty 1

## 2021-09-18 MED ORDER — LIDOCAINE-EPINEPHRINE 1 %-1:100000 IJ SOLN
INTRAMUSCULAR | Status: AC
  Filled 2021-09-18: qty 1

## 2021-09-18 MED ORDER — CEFAZOLIN SODIUM-DEXTROSE 2-4 GM/100ML-% IV SOLN
INTRAVENOUS | Status: AC
  Filled 2021-09-18: qty 100

## 2021-09-18 MED ORDER — PROPOFOL 1000 MG/100ML IV EMUL
INTRAVENOUS | Status: AC
  Filled 2021-09-18: qty 100

## 2021-09-18 MED ORDER — FENTANYL CITRATE (PF) 100 MCG/2ML IJ SOLN
INTRAMUSCULAR | Status: AC
  Filled 2021-09-18: qty 2

## 2021-09-18 MED ORDER — FENTANYL CITRATE (PF) 100 MCG/2ML IJ SOLN: 25.0000 ug | INTRAMUSCULAR | Status: DC | PRN

## 2021-09-18 MED ORDER — DEXMEDETOMIDINE (PRECEDEX) IN NS 20 MCG/5ML (4 MCG/ML) IV SYRINGE
PREFILLED_SYRINGE | INTRAVENOUS | Status: DC | PRN
  Administered 2021-09-18: 4 ug via INTRAVENOUS

## 2021-09-18 MED ORDER — PROPOFOL 500 MG/50ML IV EMUL
INTRAVENOUS | Status: DC | PRN
  Administered 2021-09-18: 45 ug/kg/min via INTRAVENOUS

## 2021-09-18 MED ORDER — CHLORHEXIDINE GLUCONATE 0.12 % MT SOLN
OROMUCOSAL | Status: AC
  Filled 2021-09-18: qty 15

## 2021-09-18 MED ORDER — CHLORHEXIDINE GLUCONATE 0.12 % MT SOLN
OROMUCOSAL | Status: AC
  Administered 2021-09-18: 15 mL via OROMUCOSAL
  Filled 2021-09-18: qty 15

## 2021-09-18 MED ORDER — CHLORHEXIDINE GLUCONATE 0.12 % MT SOLN: 15.0000 mL | Freq: Once | OROMUCOSAL | Status: AC

## 2021-09-18 MED ORDER — LACTATED RINGERS IV SOLN: INTRAVENOUS | Status: DC

## 2021-09-18 MED ORDER — CEFAZOLIN SODIUM-DEXTROSE 2-4 GM/100ML-% IV SOLN
2.0000 g | INTRAVENOUS | Status: AC
  Administered 2021-09-18: 2 g via INTRAVENOUS

## 2021-09-18 MED ORDER — ORAL CARE MOUTH RINSE: 15.0000 mL | Freq: Once | OROMUCOSAL | Status: AC

## 2021-09-18 MED ORDER — MIDAZOLAM HCL 2 MG/2ML IJ SOLN
INTRAMUSCULAR | Status: AC
  Filled 2021-09-18: qty 2

## 2021-09-18 MED ORDER — MIDAZOLAM HCL 2 MG/2ML IJ SOLN
INTRAMUSCULAR | Status: DC | PRN
  Administered 2021-09-18: 1.5 mg via INTRAVENOUS
  Administered 2021-09-18: .5 mg via INTRAVENOUS

## 2021-09-18 MED ORDER — LIDOCAINE-EPINEPHRINE 1 %-1:100000 IJ SOLN
INTRAMUSCULAR | Status: DC | PRN
  Administered 2021-09-18: 5 mL via INTRAMUSCULAR

## 2021-09-18 MED ORDER — FENTANYL CITRATE (PF) 100 MCG/2ML IJ SOLN
INTRAMUSCULAR | Status: DC | PRN
Start: 2021-09-18 — End: 2021-09-18
  Administered 2021-09-18: 50 ug via INTRAVENOUS
  Administered 2021-09-18 (×2): 25 ug via INTRAVENOUS

## 2021-09-18 SURGICAL SUPPLY — 47 items
ADH SKN CLS APL DERMABOND .7 (GAUZE/BANDAGES/DRESSINGS) ×1
BLADE SURG 15 STRL LF DISP TIS (BLADE) ×2 IMPLANT
BLADE SURG 15 STRL SS (BLADE) ×2
BLADE SURG SZ11 CARB STEEL (BLADE) ×3 IMPLANT
CNTNR SPEC 2.5X3XGRAD LEK (MISCELLANEOUS)
CONT SPEC 4OZ STER OR WHT (MISCELLANEOUS)
CONT SPEC 4OZ STRL OR WHT (MISCELLANEOUS)
CONTAINER SPEC 2.5X3XGRAD LEK (MISCELLANEOUS) IMPLANT
COTTON BALL STRL MEDIUM (GAUZE/BANDAGES/DRESSINGS) ×3 IMPLANT
DERMABOND ADVANCED (GAUZE/BANDAGES/DRESSINGS) ×1
DERMABOND ADVANCED .7 DNX12 (GAUZE/BANDAGES/DRESSINGS) ×2 IMPLANT
DRAPE LAPAROTOMY 77X122 PED (DRAPES) ×3 IMPLANT
DRSG TELFA 3X4 N-ADH STERILE (GAUZE/BANDAGES/DRESSINGS) ×3 IMPLANT
ELECT CAUTERY BLADE 6.4 (BLADE) ×3 IMPLANT
ELECT REM PT RETURN 9FT ADLT (ELECTROSURGICAL) ×2
ELECTRODE REM PT RTRN 9FT ADLT (ELECTROSURGICAL) ×2 IMPLANT
GAUZE 4X4 16PLY ~~LOC~~+RFID DBL (SPONGE) ×3 IMPLANT
GLOVE BIO SURGEON STRL SZ 6.5 (GLOVE) ×5 IMPLANT
GLOVE BIOGEL PI IND STRL 6.5 (GLOVE) ×2 IMPLANT
GLOVE BIOGEL PI INDICATOR 6.5 (GLOVE) ×3
GOWN STRL REUS W/ TWL LRG LVL3 (GOWN DISPOSABLE) ×2 IMPLANT
GOWN STRL REUS W/ TWL XL LVL3 (GOWN DISPOSABLE) ×4 IMPLANT
GOWN STRL REUS W/TWL LRG LVL3 (GOWN DISPOSABLE) ×2
GOWN STRL REUS W/TWL XL LVL3 (GOWN DISPOSABLE) ×4
KIT TURNOVER KIT A (KITS) ×3 IMPLANT
LABEL OR SOLS (LABEL) ×3 IMPLANT
MANIFOLD NEPTUNE II (INSTRUMENTS) ×3 IMPLANT
NDL HYPO 25X1 1.5 SAFETY (NEEDLE) IMPLANT
NEEDLE HYPO 25X1 1.5 SAFETY (NEEDLE) IMPLANT
NS IRRIG 500ML POUR BTL (IV SOLUTION) ×3 IMPLANT
PACK BASIN MINOR ARMC (MISCELLANEOUS) ×3 IMPLANT
SOL PREP PVP 2OZ (MISCELLANEOUS)
SOLUTION PREP PVP 2OZ (MISCELLANEOUS) ×2 IMPLANT
SUCTION FRAZIER HANDLE 10FR (MISCELLANEOUS) ×2
SUCTION TUBE FRAZIER 10FR DISP (MISCELLANEOUS) ×2 IMPLANT
SUT MNCRL AB 4-0 PS2 18 (SUTURE) ×3 IMPLANT
SUT SILK 2 0 (SUTURE) ×2
SUT SILK 2-0 18XBRD TIE 12 (SUTURE) ×2 IMPLANT
SUT SILK 3 0 (SUTURE) ×2
SUT SILK 3-0 18XBRD TIE 12 (SUTURE) ×2 IMPLANT
SUT SILK 4 0 (SUTURE) ×2
SUT SILK 4-0 18XBRD TIE 12 (SUTURE) ×2 IMPLANT
SUT VIC AB 3-0 SH 27 (SUTURE) ×2
SUT VIC AB 3-0 SH 27X BRD (SUTURE) ×2 IMPLANT
SYR 10ML LL (SYRINGE) IMPLANT
SYR BULB IRRIG 60ML STRL (SYRINGE) ×3 IMPLANT
WATER STERILE IRR 500ML POUR (IV SOLUTION) ×2 IMPLANT

## 2021-09-18 NOTE — Anesthesia Preprocedure Evaluation (Signed)
Anesthesia Evaluation  Patient identified by MRN, date of birth, ID band Patient awake    Reviewed: Allergy & Precautions, H&P , NPO status , Patient's Chart, lab work & pertinent test results, reviewed documented beta blocker date and time   History of Anesthesia Complications Negative for: history of anesthetic complications  Airway Mallampati: I  TM Distance: >3 FB Neck ROM: full    Dental  (+) Dental Advidsory Given, Caps, Teeth Intact, Chipped   Pulmonary neg pulmonary ROS, former smoker,    Pulmonary exam normal breath sounds clear to auscultation       Cardiovascular Exercise Tolerance: Good hypertension, (-) angina(-) Past MI and (-) Cardiac Stents Normal cardiovascular exam(-) dysrhythmias (-) Valvular Problems/Murmurs Rhythm:regular Rate:Normal     Neuro/Psych negative neurological ROS  negative psych ROS   GI/Hepatic Neg liver ROS, GERD  ,  Endo/Other  negative endocrine ROS  Renal/GU negative Renal ROS  negative genitourinary   Musculoskeletal   Abdominal   Peds  Hematology negative hematology ROS (+)   Anesthesia Other Findings Past Medical History: No date: BPH (benign prostatic hypertrophy) No date: GERD (gastroesophageal reflux disease) No date: Hemorrhoids No date: Hypertension   Reproductive/Obstetrics negative OB ROS                             Anesthesia Physical Anesthesia Plan  ASA: 2  Anesthesia Plan: General   Post-op Pain Management:    Induction: Intravenous  PONV Risk Score and Plan: 2 and Propofol infusion and TIVA  Airway Management Planned: Natural Airway, Simple Face Mask and Nasal Cannula  Additional Equipment:   Intra-op Plan:   Post-operative Plan:   Informed Consent: I have reviewed the patients History and Physical, chart, labs and discussed the procedure including the risks, benefits and alternatives for the proposed anesthesia with  the patient or authorized representative who has indicated his/her understanding and acceptance.     Dental Advisory Given  Plan Discussed with: Anesthesiologist, CRNA and Surgeon  Anesthesia Plan Comments:         Anesthesia Quick Evaluation

## 2021-09-18 NOTE — Discharge Instructions (Addendum)
  Diet: Resume home heart healthy regular diet.   Activity: Increase activity as tolerated. Lght activity and walking are encouraged. Do not drive or drink alcohol if taking narcotic pain medications.  Wound care: May shower with soapy water and pat dry (do not rub incisions), but no baths or submerging incision underwater until follow-up. (no swimming)   Medications: Resume all home medications. For mild to moderate pain: acetaminophen (Tylenol) or ibuprofen (if no kidney disease). Combining Tylenol with alcohol can substantially increase your risk of causing liver disease. Narcotic pain medications, if prescribed, can be used for severe pain, though may cause nausea, constipation, and drowsiness. Do not combine Tylenol and Norco within a 6 hour period as Norco contains Tylenol. If you do not need the narcotic pain medication, you do not need to fill the prescription.  Call office [phone]) at any time if any questions, worsening pain, fevers/chills, bleeding, drainage from incision site, or other concerns.   AMBULATORY SURGERY  DISCHARGE INSTRUCTIONS   The drugs that you were given will stay in your system until tomorrow so for the next 24 hours you should not:  Drive an automobile Make any legal decisions Drink any alcoholic beverage   You may resume regular meals tomorrow.  Today it is better to start with liquids and gradually work up to solid foods.  You may eat anything you prefer, but it is better to start with liquids, then soup and crackers, and gradually work up to solid foods.   Please notify your doctor immediately if you have any unusual bleeding, trouble breathing, redness and pain at the surgery site, drainage, fever, or pain not relieved by medication.    Additional Instructions:  Please contact your physician with any problems or Same Day Surgery at [phone], Monday through Friday 6 am to 4 pm, or [place] at [facility] number at [phone].

## 2021-09-18 NOTE — Transfer of Care (Signed)
Immediate Anesthesia Transfer of Care Note  Patient: Jonathon Chavez  Procedure(s) Performed: BIOPSY TEMPORAL ARTERY (Head)  Patient Location: PACU  Anesthesia Type:General  Level of Consciousness: awake, drowsy and patient cooperative  Airway & Oxygen Therapy: Patient Spontanous Breathing  Post-op Assessment: Report given to RN and Post -op Vital signs reviewed and stable  Post vital signs: Reviewed and stable  Last Vitals:  Vitals Value Taken Time  BP 107/62 09/18/21 1525  Temp    Pulse 68 09/18/21 1528  Resp 14 09/18/21 1528  SpO2 98 % 09/18/21 1528  Vitals shown include unvalidated device data.  Last Pain:  Vitals:   09/18/21 1239  TempSrc: Temporal  PainSc: 3          Complications: No notable events documented.

## 2021-09-18 NOTE — Interval H&P Note (Signed)
History and Physical Interval Note:  09/18/2021 1:27 PM  Jonathon Chavez  has presented today for surgery, with the diagnosis of M31.6 Giant cell arteritis.  The various methods of treatment have been discussed with the patient and family. After consideration of risks, benefits and other options for treatment, the patient has consented to  Procedure(s): BIOPSY TEMPORAL ARTERY (N/A) as a surgical intervention.  The patient's history has been reviewed, patient examined, no change in status, stable for surgery.  I have reviewed the patient's chart and labs.  Questions were answered to the patient's satisfaction.     Herbert Pun

## 2021-09-18 NOTE — Anesthesia Procedure Notes (Signed)
Date/Time: 09/18/2021 1:59 PM  Performed by: Demetrius Charity, CRNAPre-anesthesia Checklist: Patient identified, Emergency Drugs available, Suction available, Patient being monitored and Timeout performed Patient Re-evaluated:Patient Re-evaluated prior to induction Oxygen Delivery Method: Simple face mask Placement Confirmation: CO2 detector and positive ETCO2

## 2021-09-18 NOTE — Progress Notes (Signed)
Patient awake/alert x4. No c/o's pain, left temporal area with dermabond c/d/I Tolerated crackers and soda while in pacu without event. Reviewed procedure with patient and verbalizes understanding.

## 2021-09-18 NOTE — Anesthesia Postprocedure Evaluation (Signed)
Anesthesia Post Note  Patient: Jonathon Chavez  Procedure(s) Performed: BIOPSY TEMPORAL ARTERY (Head)  Patient location during evaluation: PACU Anesthesia Type: General Level of consciousness: awake and alert Pain management: pain level controlled Vital Signs Assessment: post-procedure vital signs reviewed and stable Respiratory status: spontaneous breathing, nonlabored ventilation, respiratory function stable and patient connected to nasal cannula oxygen Cardiovascular status: blood pressure returned to baseline and stable Postop Assessment: no apparent nausea or vomiting Anesthetic complications: no   No notable events documented.   Last Vitals:  Vitals:   09/18/21 1545 09/18/21 1558  BP: 131/66 124/66  Pulse: 66 (!) 57  Resp: 20 16  Temp: (!) 36.4 C (!) 36.1 C  SpO2: 96% 98%    Last Pain:  Vitals:   09/18/21 1558  TempSrc: Temporal  PainSc: 0-No pain                 Arita Miss

## 2021-09-18 NOTE — Op Note (Signed)
Pre Op Diagnosis: Giant cell arteritis  Post Op Diagnosis: Same  Procedure:  Left Temporal artery biopsy  Anesthesia: MAC, local  Surgeon: Dr. Windell Moment  Indications: This 76 y.o. year old male has new-onset headache, jaw claudication, blurry vision and erythrocyte sedimentation rate greater than 50 mm/h with left > right temporal artery reduced pulsation. The patient has been informed that a temporal artery biopsy is required to confirm the diagnosis of temporal arteritis. The risks of the procedure were explained to the patient who elected to proceed with the biopsy.   Description of procedure: The patient was placed in the supine position with the head turned so the operative side is up. A time-out was completed verifying correct patient, procedure, site, positioning, and implant(s) and/or special equipment prior to beginning this procedure. The left temporal area was prepped and draped in the usual sterile fashion. Local anesthetics (1% Lidocaine). A 4 cm incision was made directly over the artery through the skin and subcutaneous tissue using a #15 blade scalpel. The temporal artery was identified in the superficial layers of the superficial temporal fascia. Blunt dissection with a hemostat was performed parallel to the vessel to avoid tearing it. The dissection proceeded beneath the vessel so that a hemostat was passed below it. After the vessel was isolated, 4-0 silk sutures were passed around the proximal and distal portions of the isolated artery and tied. Branches of the main artery were also ligated. The vessel was then transected. After ensuring hemostasis, the subcutaneous tissues were closed using interrupted 4-0 Vicryl sutures. The skin was closed with a running subcuticular 4-0 Monocryl sutures. Dermabond applied over the wound and covered with sterile dressings.  The patient tolerated well the procedure.  The specimen was sent to the pathologist.   Specimen: Left Temporal  Artery  Herbert Pun, MD, FACS

## 2021-09-19 ENCOUNTER — Other Ambulatory Visit: Payer: Self-pay | Admitting: Family Medicine

## 2021-09-19 ENCOUNTER — Encounter: Payer: Self-pay | Admitting: General Surgery

## 2021-09-19 ENCOUNTER — Other Ambulatory Visit (HOSPITAL_COMMUNITY): Payer: Self-pay

## 2021-09-19 MED ORDER — LOSARTAN POTASSIUM 50 MG PO TABS
50.0000 mg | ORAL_TABLET | Freq: Every day | ORAL | 0 refills | Status: DC
  Filled 2021-09-19: qty 90, 90d supply, fill #0

## 2021-09-19 NOTE — Telephone Encounter (Signed)
Pt was seen yesterday 09/13/21 with [facility]  Notes in Epic/CareEverywhere  Nothing further needed.

## 2021-09-20 ENCOUNTER — Encounter: Payer: Self-pay | Admitting: Family Medicine

## 2021-09-20 ENCOUNTER — Ambulatory Visit (INDEPENDENT_AMBULATORY_CARE_PROVIDER_SITE_OTHER): Payer: PPO | Admitting: Family Medicine

## 2021-09-20 VITALS — BP 130/68 | HR 85 | Temp 97.5°F | Ht 71.75 in | Wt 180.4 lb

## 2021-09-20 DIAGNOSIS — Z0000 Encounter for general adult medical examination without abnormal findings: Secondary | ICD-10-CM

## 2021-09-20 DIAGNOSIS — M5030 Other cervical disc degeneration, unspecified cervical region: Secondary | ICD-10-CM | POA: Diagnosis not present

## 2021-09-20 DIAGNOSIS — Z7189 Other specified counseling: Secondary | ICD-10-CM

## 2021-09-20 DIAGNOSIS — I10 Essential (primary) hypertension: Secondary | ICD-10-CM

## 2021-09-20 DIAGNOSIS — D696 Thrombocytopenia, unspecified: Secondary | ICD-10-CM

## 2021-09-20 DIAGNOSIS — M316 Other giant cell arteritis: Secondary | ICD-10-CM

## 2021-09-20 DIAGNOSIS — H539 Unspecified visual disturbance: Secondary | ICD-10-CM | POA: Diagnosis not present

## 2021-09-20 DIAGNOSIS — K219 Gastro-esophageal reflux disease without esophagitis: Secondary | ICD-10-CM | POA: Diagnosis not present

## 2021-09-20 DIAGNOSIS — N401 Enlarged prostate with lower urinary tract symptoms: Secondary | ICD-10-CM | POA: Diagnosis not present

## 2021-09-20 DIAGNOSIS — E1169 Type 2 diabetes mellitus with other specified complication: Secondary | ICD-10-CM | POA: Diagnosis not present

## 2021-09-20 DIAGNOSIS — L80 Vitiligo: Secondary | ICD-10-CM

## 2021-09-20 DIAGNOSIS — K2270 Barrett's esophagus without dysplasia: Secondary | ICD-10-CM | POA: Diagnosis not present

## 2021-09-20 DIAGNOSIS — Z0001 Encounter for general adult medical examination with abnormal findings: Secondary | ICD-10-CM

## 2021-09-20 DIAGNOSIS — M25852 Other specified joint disorders, left hip: Secondary | ICD-10-CM

## 2021-09-20 DIAGNOSIS — R351 Nocturia: Secondary | ICD-10-CM

## 2021-09-20 DIAGNOSIS — E785 Hyperlipidemia, unspecified: Secondary | ICD-10-CM

## 2021-09-20 LAB — SURGICAL PATHOLOGY

## 2021-09-20 MED ORDER — PREDNISONE 50 MG PO TABS: 50.0000 mg | ORAL_TABLET | Freq: Every day | ORAL | 0 refills | Status: DC

## 2021-09-20 NOTE — Patient Instructions (Addendum)
Prednisone refill printed out today - to have on hand in case needed, but preference to rheumatology treatment plan.  Ok to call and cancel appointment with PM&R spine doctor.  Check with pharmacy about preferred insurance brand for glucometer and let me know and I will send in.  Keep me updated on sugar levels.  Good to see you today Return in 3 months for follow up visit.   Diabetes Mellitus and Nutrition, Adult When you have diabetes, or diabetes mellitus, it is very important to have healthy eating habits because your blood sugar (glucose) levels are greatly affected by what you eat and drink. Eating healthy foods in the right amounts, at about the same times every day, can help you: Manage your blood glucose. Lower your risk of heart disease. Improve your blood pressure. Reach or maintain a healthy weight. What can affect my meal plan? Every person with diabetes is different, and each person has different needs for a meal plan. Your health care provider may recommend that you work with a dietitian to make a meal plan that is best for you. Your meal plan may vary depending on factors such as: The calories you need. The medicines you take. Your weight. Your blood glucose, blood pressure, and cholesterol levels. Your activity level. Other health conditions you have, such as heart or kidney disease. How do carbohydrates affect me? Carbohydrates, also called carbs, affect your blood glucose level more than any other type of food. Eating carbs raises the amount of glucose in your blood. It is important to know how many carbs you can safely have in each meal. This is different for every person. Your dietitian can help you calculate how many carbs you should have at each meal and for each snack. How does alcohol affect me? Alcohol can cause a decrease in blood glucose (hypoglycemia), especially if you use insulin or take certain diabetes medicines by mouth. Hypoglycemia can be a  life-threatening condition. Symptoms of hypoglycemia, such as sleepiness, dizziness, and confusion, are similar to symptoms of having too much alcohol. Do not drink alcohol if: Your health care provider tells you not to drink. You are pregnant, may be pregnant, or are planning to become pregnant. If you drink alcohol: Limit how much you have to: 0-1 drink a day for women. 0-2 drinks a day for men. Know how much alcohol is in your drink. In the [place] one drink equals one 12 oz bottle of beer (355 mL), one 5 oz glass of wine (148 mL), or one 1 oz glass of hard liquor (44 mL). Keep yourself hydrated with water, diet soda, or unsweetened iced tea. Keep in mind that regular soda, juice, and other mixers may contain a lot of sugar and must be counted as carbs. What are tips for following this plan?  Reading food labels Start by checking the serving size on the Nutrition Facts label of packaged foods and drinks. The number of calories and the amount of carbs, fats, and other nutrients listed on the label are based on one serving of the item. Many items contain more than one serving per package. Check the total grams (g) of carbs in one serving. Check the number of grams of saturated fats and trans fats in one serving. Choose foods that have a low amount or none of these fats. Check the number of milligrams (mg) of salt (sodium) in one serving. Most people should limit total sodium intake to less than 2,300 mg per day. Always check the  nutrition information of foods labeled as "low-fat" or "nonfat." These foods may be higher in added sugar or refined carbs and should be avoided. Talk to your dietitian to identify your daily goals for nutrients listed on the label. Shopping Avoid buying canned, pre-made, or processed foods. These foods tend to be high in fat, sodium, and added sugar. Shop around the outside edge of the grocery store. This is where you will most often find fresh fruits and vegetables,  bulk grains, fresh meats, and fresh dairy products. Cooking Use low-heat cooking methods, such as baking, instead of high-heat cooking methods, such as deep frying. Cook using healthy oils, such as olive, canola, or sunflower oil. Avoid cooking with butter, cream, or high-fat meats. Meal planning Eat meals and snacks regularly, preferably at the same times every day. Avoid going long periods of time without eating. Eat foods that are high in fiber, such as fresh fruits, vegetables, beans, and whole grains. Eat 4-6 oz (112-168 g) of lean protein each day, such as lean meat, chicken, fish, eggs, or tofu. One ounce (oz) (28 g) of lean protein is equal to: 1 oz (28 g) of meat, chicken, or fish. 1 egg.  cup (62 g) of tofu. Eat some foods each day that contain healthy fats, such as avocado, nuts, seeds, and fish. What foods should I eat? Fruits Berries. Apples. Oranges. Peaches. Apricots. Plums. Grapes. Mangoes. Papayas. Pomegranates. Kiwi. Cherries. Vegetables Leafy greens, including lettuce, spinach, kale, chard, collard greens, mustard greens, and cabbage. Beets. Cauliflower. Broccoli. Carrots. Green beans. Tomatoes. Peppers. Onions. Cucumbers. Brussels sprouts. Grains Whole grains, such as whole-wheat or whole-grain bread, crackers, tortillas, cereal, and pasta. Unsweetened oatmeal. Quinoa. Brown or wild rice. Meats and other proteins Seafood. Poultry without skin. Lean cuts of poultry and beef. Tofu. Nuts. Seeds. Dairy Low-fat or fat-free dairy products such as milk, yogurt, and cheese. The items listed above may not be a complete list of foods and beverages you can eat and drink. Contact a dietitian for more information. What foods should I avoid? Fruits Fruits canned with syrup. Vegetables Canned vegetables. Frozen vegetables with butter or cream sauce. Grains Refined white flour and flour products such as bread, pasta, snack foods, and cereals. Avoid all processed foods. Meats and  other proteins Fatty cuts of meat. Poultry with skin. Breaded or fried meats. Processed meat. Avoid saturated fats. Dairy Full-fat yogurt, cheese, or milk. Beverages Sweetened drinks, such as soda or iced tea. The items listed above may not be a complete list of foods and beverages you should avoid. Contact a dietitian for more information. Questions to ask a health care provider Do I need to meet with a certified diabetes care and education specialist? Do I need to meet with a dietitian? What number can I call if I have questions? When are the best times to check my blood glucose? Where to find more information: American Diabetes Association: diabetes.org Academy of Nutrition and Dietetics: eatright.[employer] of Diabetes and Digestive and Kidney Diseases: [employer] Association of Diabetes Care & Education Specialists: diabeteseducator.org Summary It is important to have healthy eating habits because your blood sugar (glucose) levels are greatly affected by what you eat and drink. It is important to use alcohol carefully. A healthy meal plan will help you manage your blood glucose and lower your risk of heart disease. Your health care provider may recommend that you work with a dietitian to make a meal plan that is best for you. This information is not intended to  replace advice given to you by your health care provider. Make sure you discuss any questions you have with your health care provider. Document Revised: 09/09/2019 Document Reviewed: 09/09/2019 Elsevier Patient Education  [place] Maintenance After Age 79 After age 4, you are at a higher risk for certain long-term diseases and infections as well as injuries from falls. Falls are a major cause of broken bones and head injuries in people who are older than age 32. Getting regular preventive care can help to keep you healthy and well. Preventive care includes getting regular testing and making  lifestyle changes as recommended by your health care provider. Talk with your health care provider about: Which screenings and tests you should have. A screening is a test that checks for a disease when you have no symptoms. A diet and exercise plan that is right for you. What should I know about screenings and tests to prevent falls? Screening and testing are the best ways to find a health problem early. Early diagnosis and treatment give you the best chance of managing medical conditions that are common after age 55. Certain conditions and lifestyle choices may make you more likely to have a fall. Your health care provider may recommend: Regular vision checks. Poor vision and conditions such as cataracts can make you more likely to have a fall. If you wear glasses, make sure to get your prescription updated if your vision changes. Medicine review. Work with your health care provider to regularly review all of the medicines you are taking, including over-the-counter medicines. Ask your health care provider about any side effects that may make you more likely to have a fall. Tell your health care provider if any medicines that you take make you feel dizzy or sleepy. Strength and balance checks. Your health care provider may recommend certain tests to check your strength and balance while standing, walking, or changing positions. Foot health exam. Foot pain and numbness, as well as not wearing proper footwear, can make you more likely to have a fall. Screenings, including: Osteoporosis screening. Osteoporosis is a condition that causes the bones to get weaker and break more easily. Blood pressure screening. Blood pressure changes and medicines to control blood pressure can make you feel dizzy. Depression screening. You may be more likely to have a fall if you have a fear of falling, feel depressed, or feel unable to do activities that you used to do. Alcohol use screening. Using too much alcohol can  affect your balance and may make you more likely to have a fall. Follow these instructions at home: Lifestyle Do not drink alcohol if: Your health care provider tells you not to drink. If you drink alcohol: Limit how much you have to: 0-1 drink a day for women. 0-2 drinks a day for men. Know how much alcohol is in your drink. In the [place] one drink equals one 12 oz bottle of beer (355 mL), one 5 oz glass of wine (148 mL), or one 1 oz glass of hard liquor (44 mL). Do not use any products that contain nicotine or tobacco. These products include cigarettes, chewing tobacco, and vaping devices, such as e-cigarettes. If you need help quitting, ask your health care provider. Activity  Follow a regular exercise program to stay fit. This will help you maintain your balance. Ask your health care provider what types of exercise are appropriate for you. If you need a cane or walker, use it as recommended by your health  care provider. Wear supportive shoes that have nonskid soles. Safety  Remove any tripping hazards, such as rugs, cords, and clutter. Install safety equipment such as grab bars in bathrooms and safety rails on stairs. Keep rooms and walkways well-lit. General instructions Talk with your health care provider about your risks for falling. Tell your health care provider if: You fall. Be sure to tell your health care provider about all falls, even ones that seem minor. You feel dizzy, tiredness (fatigue), or off-balance. Take over-the-counter and prescription medicines only as told by your health care provider. These include supplements. Eat a healthy diet and maintain a healthy weight. A healthy diet includes low-fat dairy products, low-fat (lean) meats, and fiber from whole grains, beans, and lots of fruits and vegetables. Stay current with your vaccines. Schedule regular health, dental, and eye exams. Summary Having a healthy lifestyle and getting preventive care can help to protect  your health and wellness after age 49. Screening and testing are the best way to find a health problem early and help you avoid having a fall. Early diagnosis and treatment give you the best chance for managing medical conditions that are more common for people who are older than age 22. Falls are a major cause of broken bones and head injuries in people who are older than age 57. Take precautions to prevent a fall at home. Work with your health care provider to learn what changes you can make to improve your health and wellness and to prevent falls. This information is not intended to replace advice given to you by your health care provider. Make sure you discuss any questions you have with your health care provider. Document Revised: 06/27/2020 Document Reviewed: 06/27/2020 Elsevier Patient Education  [place].

## 2021-09-20 NOTE — Progress Notes (Unsigned)
Patient ID: Jonathon Chavez, male    DOB: [birth year], 76 y.o.   MRN: [id number]  This visit was conducted in person.  BP 130/68   Pulse 85   Temp (!) 97.5 F (36.4 C) (Temporal)   Ht 5' 11.75" (1.822 m)   Wt 180 lb 6 oz (81.8 kg)   SpO2 96%   BMI 24.63 kg/m    CC: CPE Subjective:   HPI: Jonathon Chavez is a 76 y.o. male presenting on 09/20/2021 for Annual Exam [facility] prt 2. Pt accompanied by wife, [name].  )   See prior note for details. Recent concerning symptoms for giant cell arteritis, started on prednisone '50mg'$  daily. Saw rheum, s/p temporal artery biopsy positive for temporal arteritis. Discussing starting actemra in hopes of minimizing prednisone use  Saw health advisor 12/2020. Note reviewed.   No results found.  Flowsheet Row Clinical Support from 01/04/2021 in [place] at [place]  PHQ-2 Total Score 0          01/04/2021   10:44 AM 01/01/2020   10:36 AM 09/15/2019    1:50 PM 12/25/2018   11:36 AM 08/29/2017   12:19 PM  Fall Risk   Falls in the past year? 0 0 0 0 No  Comment   Emmi Telephone Survey: data to providers prior to load    Number falls in past yr: 0 0     Injury with Fall? 0 0     Risk for fall due to : No Fall Risks Medication side effect     Follow up Falls prevention discussed Falls evaluation completed;Falls prevention discussed        Healthy diet changes - ketodiet, low carbs, sugars. Hopeful this will reverse trending hyperglycemia since starting prednisone.   Preventative: COLONOSCOPY 10/20/2019 - TA, SSP, severe diverticulosis, grade IV hem, rpt 5 yrs (Pyrtle) - rec surgery, pt not currently interested in hemorrhoid treatment  UPPER GASTROINTESTINAL ENDOSCOPY 10/20/2019 - Barrett's, 2cm HH ([facility])  Prostate cancer screening - yearly PSA (brother with prostate cancer), nocturia x2-3. Declines DRE today, h/o hemorrhoids Lung cancer screening - not eligible Flu shot yearly Tdap 2014  [place] 07/2019, 09/2019, no  booster  Pneumovax 2014, prevnar 2015  zostavax - 2012 Shingrix - discussed - to check at pharmacy but deferring given prednisone use Advanced directive discussion - No living will. Working on this. Wife would be health care POA. Would accept resuscitation--no prolonged ventilation. Probably no tube feeds if cognitively unaware.  Seat belt use discussed.  Sunscreen use and discussed. No changing moles on skin.  Ex smoker (quit 1975)  Alcohol - none Dentist - overdue  Eye exam - yearly - saw retinologist until released  Bowel - no constipation Bladder - no incontinence, notes weakening of stream  Lives with wife.  Occupation: truck Geophysicist/field seismologist for [employer] Activity: increased walking - walks dog, physical work  Diet: good water, fruits/vegetables daily      Relevant past medical, surgical, family and social history reviewed and updated as indicated. Interim medical history since our last visit reviewed. Allergies and medications reviewed and updated. Outpatient Medications Prior to Visit  Medication Sig Dispense Refill   Ascorbic Acid (VITAMIN C) 1000 MG tablet Take 1,000 mg by mouth daily.     HYDROcodone-acetaminophen (NORCO/VICODIN) 5-325 MG tablet Take 0.5-1 tablets by mouth 3 (three) times daily as needed for moderate pain. 15 tablet 0   losartan (COZAAR) 50 MG tablet TAKE ONE TABLET BY MOUTH DAILY  90 tablet 0   pantoprazole (PROTONIX) 40 MG tablet Take 1 tablet by mouth daily. 90 tablet 0   predniSONE (DELTASONE) 50 MG tablet Take 1 tablet (50 mg total) by mouth daily with breakfast. 30 tablet 0   No facility-administered medications prior to visit.     Per HPI unless specifically indicated in ROS section below Review of Systems  Constitutional:  Positive for appetite change (increase). Negative for activity change, chills, fatigue, fever and unexpected weight change.  HENT:  Negative for hearing loss.   Eyes:  Negative for visual disturbance.  Respiratory:  Negative for  cough, chest tightness, shortness of breath and wheezing.   Cardiovascular:  Negative for chest pain, palpitations and leg swelling.  Gastrointestinal:  Negative for abdominal distention, abdominal pain, blood in stool, constipation, diarrhea, nausea and vomiting.  Genitourinary:  Negative for difficulty urinating and hematuria.  Musculoskeletal:  Negative for arthralgias, myalgias and neck pain.  Skin:  Negative for rash.  Neurological:  Positive for dizziness (orthostatic) and headaches. Negative for seizures and syncope.  Hematological:  Negative for adenopathy. Bruises/bleeds easily.  Psychiatric/Behavioral:  Negative for dysphoric mood. The patient is not nervous/anxious.     Objective:  BP 130/68   Pulse 85   Temp (!) 97.5 F (36.4 C) (Temporal)   Ht 5' 11.75" (1.822 m)   Wt 180 lb 6 oz (81.8 kg)   SpO2 96%   BMI 24.63 kg/m   Wt Readings from Last 3 Encounters:  09/20/21 180 lb 6 oz (81.8 kg)  09/18/21 177 lb 12.8 oz (80.6 kg)  09/04/21 183 lb (83 kg)      Physical Exam Vitals and nursing note reviewed.  Constitutional:      General: He is not in acute distress.    Appearance: Normal appearance. He is well-developed. He is not ill-appearing.  HENT:     Head: Normocephalic and atraumatic.     Comments: Healing incision to left face anterior to ear    Right Ear: Hearing, tympanic membrane, ear canal and external ear normal.     Left Ear: Hearing, tympanic membrane, ear canal and external ear normal.  Eyes:     General: No scleral icterus.    Extraocular Movements: Extraocular movements intact.     Conjunctiva/sclera: Conjunctivae normal.     Pupils: Pupils are equal, round, and reactive to light.  Neck:     Thyroid: No thyroid mass or thyromegaly.  Cardiovascular:     Rate and Rhythm: Normal rate and regular rhythm.     Pulses: Normal pulses.          Radial pulses are 2+ on the right side and 2+ on the left side.     Heart sounds: Normal heart sounds. No murmur  heard. Pulmonary:     Effort: Pulmonary effort is normal. No respiratory distress.     Breath sounds: Normal breath sounds. No wheezing, rhonchi or rales.  Abdominal:     General: Bowel sounds are normal. There is no distension.     Palpations: Abdomen is soft. There is no mass.     Tenderness: There is no abdominal tenderness. There is no guarding or rebound.     Hernia: No hernia is present.  Musculoskeletal:        General: Normal range of motion.     Cervical back: Normal range of motion and neck supple.     Right lower leg: No edema.     Left lower leg: No edema.  Lymphadenopathy:  Cervical: No cervical adenopathy.  Skin:    General: Skin is warm and dry.     Findings: Bruising and ecchymosis present. No rash.          Comments: R forearm at site of recent IV  Neurological:     General: No focal deficit present.     Mental Status: He is alert and oriented to person, place, and time.  Psychiatric:        Mood and Affect: Mood normal.        Behavior: Behavior normal.        Thought Content: Thought content normal.        Judgment: Judgment normal.       Results for orders placed or performed during the hospital encounter of 09/18/21  Surgical pathology  Result Value Ref Range   SURGICAL PATHOLOGY      SURGICAL PATHOLOGY CASE: [id number] PATIENT: Horris Latino Surgical Pathology Report     Specimen Submitted: A. Temporal artery, left  Clinical History: M31.6 giant cell arteritis      DIAGNOSIS: A. TEMPORAL ARTERY, LEFT; BIOPSY: - MORPHOLOGIC FINDINGS CONSISTENT WITH TEMPORAL ARTERITIS (SEE COMMENT).  Comment: There are lymphocytes and macrophages focally infiltrate infiltrating the vascular wall,  in addition there are occasional multinucleate giant cells associated with disruption of elastin - features of temporal arteritis (giant cell arteritis).  GROSS DESCRIPTION: A. Labeled: Left temporal artery biopsy Received: Fresh Collection time:  3:09 PM on 09/18/2021 Placed into formalin time: 3:48 PM on 09/18/2021 Tissue fragment(s): 1 Size: 3.6 cm in length by 0.2 cm in diameter Description: Received is a tan, hemorrhagic tubular fragment.  The specimen is bisected Entirely submitted in 2 cassettes.  CM 09/19/2021  Final Diagnosis performed by Theodora Blow, MD.   Electronically signed 09/20/2021 12:44:31PM The electronic signature indicates that the named Attending Pathologist has evaluated the specimen Technical component performed at [facility], [address] [place], [place] [address] Lab: [phone] Dir: Rush Farmer, MD, MMM  Professional component performed at [facility], [address], [place], [place], [place] [address] Lab: [phone] Dir: Kathi Simpers, MD     Assessment & Plan:   Problem List Items Addressed This Visit     Encounter for general adult medical examination with abnormal findings - Primary (Chronic)    Preventative protocols reviewed and updated unless pt declined. Discussed healthy diet and lifestyle.       Advanced care planning/counseling discussion (Chronic)    Advanced directive discussion - No living will. Working on this. Wife would be health care POA. Would accept resuscitation--no prolonged ventilation. Probably no tube feeds if cognitively unaware.       GERD    Stable period on pantoprazole '40mg'$  daily - continue.  H/o barrett's      Benign prostatic hyperplasia    PSA stable.       Dyslipidemia associated with type 2 diabetes mellitus ([place])    LDL too high with new diabetes diagnosis.  He is not interested in statin treatment at this time.  Desires to work on healthy diet/lifestyle to improve cholesterol. Consider rechecking in 4-6 months.  The 10-year ASCVD risk score (Arnett DK, et al., 2019) is: 46.4%   Values used to calculate the score:     Age: 29 years     Sex: Male     Is Non-Hispanic African American: No     Diabetic: Yes     Tobacco smoker:  No     Systolic Blood Pressure:  130 mmHg     Is BP treated: Yes     HDL Cholesterol: 51 mg/dL     Total Cholesterol: 208 mg/dL       Vitiligo   Hypertension, essential    Chronic, stable - continue losartan.       Thrombocytopenia ([place])    plt levels normal on latest check.       Barrett esophagus    Followed by GI - continue daily PPI      Femoroacetabular impingement of left hip    Possible red herring - symptoms may have been more PMR related.       DDD (degenerative disc disease), cervical    With recent temporal arteritis diagnosis, he desires to defer PM&R eval at this time which is reasonable, will continue rheum f/u.       Relevant Medications   predniSONE (DELTASONE) 50 MG tablet   Temporal arteritis ([place])    Reviewed disease course to date.  He has upcoming ophthalmology evaluation planned next week.  He will continue to f/u with rheumatology.  He has enough prednisone '50mg'$  to last until mid august - I have provided WASP for rpt prednisone in case needed prior to return to rheum.  Discussing Actemra as prednisone-sparing treatment.       Vision changes    See above. Unclear if related to temporal arteritis. On prednisone '50mg'$  daily.       Type 2 diabetes mellitus with other specified complication ([place])    New diagnosis, anticipate prednisone-driven.  Discussed latest A1c, recommended fasting and postprandial sugar ranges, discussed possible need for medication especially while on prednisone.  He will check on preferred glucometer brand and I will send in for him to have at home.  RTC 3 mo DM f/u visit.         Meds ordered this encounter  Medications   predniSONE (DELTASONE) 50 MG tablet    Sig: Take 1 tablet (50 mg total) by mouth daily with breakfast.    Dispense:  30 tablet    Refill:  0   No orders of the defined types were placed in this encounter.   Patient instructions: Prednisone refill printed out today - to have on hand in case needed,  but preference to rheumatology treatment plan.  Ok to call and cancel appointment with PM&R spine doctor.  Check with pharmacy about preferred insurance brand for glucometer and let me know and I will send in.  Good to see you today Return in 3 months for follow up visit.   Follow up plan: Return in about 3 months (around 12/21/2021) for follow up visit.  Ria Bush, MD

## 2021-09-21 ENCOUNTER — Encounter (INDEPENDENT_AMBULATORY_CARE_PROVIDER_SITE_OTHER): Payer: PPO | Admitting: Ophthalmology

## 2021-09-21 ENCOUNTER — Other Ambulatory Visit (HOSPITAL_COMMUNITY): Payer: Self-pay

## 2021-09-21 DIAGNOSIS — R7303 Prediabetes: Secondary | ICD-10-CM | POA: Insufficient documentation

## 2021-09-21 DIAGNOSIS — E1169 Type 2 diabetes mellitus with other specified complication: Secondary | ICD-10-CM | POA: Insufficient documentation

## 2021-09-21 MED ORDER — ONETOUCH VERIO VI STRP: ORAL_STRIP | 3 refills | Status: DC

## 2021-09-21 MED ORDER — ONETOUCH VERIO FLEX SYSTEM W/DEVICE KIT: PACK | 0 refills | Status: AC

## 2021-09-21 MED ORDER — ONETOUCH DELICA PLUS LANCET33G MISC: 3 refills | Status: AC

## 2021-09-21 NOTE — Assessment & Plan Note (Signed)
New diagnosis, anticipate prednisone-driven.  Discussed latest A1c, recommended fasting and postprandial sugar ranges, discussed possible need for medication especially while on prednisone.  He will check on preferred glucometer brand and I will send in for him to have at home.  RTC 3 mo DM f/u visit.

## 2021-09-21 NOTE — Assessment & Plan Note (Signed)
Advanced directive discussion - No living will. Working on this. Wife would be health care POA. Would accept resuscitation--no prolonged ventilation. Probably no tube feeds if cognitively unaware.

## 2021-09-21 NOTE — Assessment & Plan Note (Signed)
Stable period on pantoprazole '40mg'$  daily - continue.  H/o barrett's

## 2021-09-21 NOTE — Assessment & Plan Note (Signed)
Possible red herring - symptoms may have been more PMR related.

## 2021-09-21 NOTE — Telephone Encounter (Signed)
E-scribed [employer], OneTouch Verio test strips and [employer] Plus lancets to [facility].

## 2021-09-21 NOTE — Addendum Note (Signed)
Addended by: Brenton Grills on: 02/23/1028 13:14 PM   Modules accepted: Orders

## 2021-09-21 NOTE — Assessment & Plan Note (Signed)
Chronic, stable - continue losartan.

## 2021-09-21 NOTE — Assessment & Plan Note (Signed)
plt levels normal on latest check.

## 2021-09-21 NOTE — Assessment & Plan Note (Signed)
With recent temporal arteritis diagnosis, he desires to defer PM&R eval at this time which is reasonable, will continue rheum f/u.

## 2021-09-21 NOTE — Assessment & Plan Note (Addendum)
PSA stable

## 2021-09-21 NOTE — Assessment & Plan Note (Signed)
LDL too high with new diabetes diagnosis.  He is not interested in statin treatment at this time.  Desires to work on healthy diet/lifestyle to improve cholesterol. Consider rechecking in 4-6 months.  The 10-year ASCVD risk score (Arnett DK, et al., 2019) is: 46.4%   Values used to calculate the score:     Age: 76 years     Sex: Male     Is Non-Hispanic African American: No     Diabetic: Yes     Tobacco smoker: No     Systolic Blood Pressure: 912 mmHg     Is BP treated: Yes     HDL Cholesterol: 51 mg/dL     Total Cholesterol: 208 mg/dL

## 2021-09-21 NOTE — Assessment & Plan Note (Addendum)
See above. Unclear if related to temporal arteritis. On prednisone '50mg'$  daily.

## 2021-09-21 NOTE — Assessment & Plan Note (Addendum)
Preventative protocols reviewed and updated unless pt declined. Discussed healthy diet and lifestyle.  

## 2021-09-21 NOTE — Assessment & Plan Note (Addendum)
Followed by GI - continue daily PPI

## 2021-09-21 NOTE — Assessment & Plan Note (Signed)
Reviewed disease course to date.  He has upcoming ophthalmology evaluation planned next week.  He will continue to f/u with rheumatology.  He has enough prednisone '50mg'$  to last until mid august - I have provided WASP for rpt prednisone in case needed prior to return to rheum.  Discussing Actemra as prednisone-sparing treatment.

## 2021-09-22 NOTE — Telephone Encounter (Signed)
Thank you :)

## 2021-09-25 ENCOUNTER — Ambulatory Visit (INDEPENDENT_AMBULATORY_CARE_PROVIDER_SITE_OTHER): Payer: PPO | Admitting: Ophthalmology

## 2021-09-25 DIAGNOSIS — E1169 Type 2 diabetes mellitus with other specified complication: Secondary | ICD-10-CM

## 2021-09-25 DIAGNOSIS — H348322 Tributary (branch) retinal vein occlusion, left eye, stable: Secondary | ICD-10-CM | POA: Diagnosis not present

## 2021-09-25 DIAGNOSIS — Z961 Presence of intraocular lens: Secondary | ICD-10-CM | POA: Diagnosis not present

## 2021-09-25 DIAGNOSIS — M316 Other giant cell arteritis: Secondary | ICD-10-CM | POA: Diagnosis not present

## 2021-09-25 NOTE — Assessment & Plan Note (Addendum)
The nature of diabetic retinopathy was explained using the following analogy: "Retinopathy develops in the body's blood supply like salty water corrodes the linings of pipes in a house, until rust appears, then holes in the pipes develop which leak followed by destruction and loss of the pipes as the corrosion turns them to dust. In a similar fashion, Diabetes damages the blood supply of the body by cumulative long--term elevated blood sugar, which corrodes the blood supply in the body, particularly the blood vessels supplying the retina, kidneys, and nerves".  Thus, control of blood sugar, slows the progression of the corrosive effect of diabetes mellitus.   No signs of diabetic retinopathy

## 2021-09-25 NOTE — Assessment & Plan Note (Signed)
No signs of recurrence of CME or BRVO OS, looks stabilized with collateralization of the vessels on the nerve

## 2021-09-25 NOTE — Assessment & Plan Note (Signed)
Follow-up with Dr. Katy Fitch

## 2021-09-25 NOTE — Progress Notes (Signed)
  09/25/2021     CHIEF COMPLAINT Patient presents for  Chief Complaint  Patient presents with   Eye Problem   Patient has evanescent blurred vision, which comes and goes.  And now with persistence of blurred vision.     HISTORY OF PRESENT ILLNESS: Jonathon Chavez is a 76 y.o. male who presents to the clinic today for:   HPI   WIP- Blurred vision- Ref by PCP for FU. Patient had recent head pain, and subsequently underwent temporal artery biopsy said to be "positive".  For which patient has commenced therapy with steroids systemically.  Dr. Gutierrez and consultant are working on potentially changing the patient to Actemra.    Pt was last seen about a year ago. Pt is experiencing blurry vision.  Pt stated, "I went to PCP and I had an artery biopsy. I was sent here for blurry vision for the first time but it has progressively gotten worse over the last two months." Pt denies floaters and FOL.      Last edited by ,  A, MD on 09/25/2021  2:26 PM.      Referring physician: Groat, Christopher, MD [address] [place],  [place] [address]  HISTORICAL INFORMATION:   Selected notes from the medical record:     Lab Results  Component Value Date   HGBA1C 7.1 (H) 09/13/2021     CURRENT MEDICATIONS: No current outpatient medications on file. (Ophthalmic Drugs)   No current facility-administered medications for this visit. (Ophthalmic Drugs)   Current Outpatient Medications (Other)  Medication Sig   Ascorbic Acid (VITAMIN C) 1000 MG tablet Take 1,000 mg by mouth daily.   Blood Glucose Monitoring Suppl (ONETOUCH VERIO FLEX SYSTEM) w/Device KIT Use as instructed to check blood sugar once a day   glucose blood (ONETOUCH VERIO) test strip Use as instructed to check blood sugar once a day   HYDROcodone-acetaminophen (NORCO/VICODIN) 5-325 MG tablet Take 0.5-1 tablets by mouth 3 (three) times daily as needed for moderate pain.   Lancets (ONETOUCH DELICA PLUS  LANCET33G) MISC Use as instructed to check blood sugar once a day   losartan (COZAAR) 50 MG tablet TAKE ONE TABLET BY MOUTH DAILY   pantoprazole (PROTONIX) 40 MG tablet Take 1 tablet by mouth daily.   predniSONE (DELTASONE) 50 MG tablet Take 1 tablet (50 mg total) by mouth daily with breakfast.   No current facility-administered medications for this visit. (Other)      REVIEW OF SYSTEMS: ROS   Negative for: Constitutional, Gastrointestinal, Neurological, Skin, Genitourinary, Musculoskeletal, HENT, Endocrine, Cardiovascular, Eyes, Respiratory, Psychiatric, Allergic/Imm, Heme/Lymph Last edited by Ma, San on 09/25/2021  1:58 PM.       ALLERGIES Allergies  Allergen Reactions   Lisinopril Cough    Dry cough    PAST MEDICAL HISTORY Past Medical History:  Diagnosis Date   BPH (benign prostatic hypertrophy)    GERD (gastroesophageal reflux disease)    Hemorrhoids    Hypertension    Past Surgical History:  Procedure Laterality Date   ARTERY BIOPSY N/A 09/18/2021   Procedure: BIOPSY TEMPORAL ARTERY;  Surgeon: Cintron-Diaz, Edgardo, MD;  Location: [facility] ORS;  Service: General;  Laterality: N/A;   CATARACT EXTRACTION Bilateral 2018   Dr Robert Groat   CHOLECYSTECTOMY  8/14   Dr Ely   COLONOSCOPY  03/2013   sessile serrated polyp, diverticulosis, rpt 5 yrs (Pyrtle)   COLONOSCOPY  10/20/2019   TA, SSP, severe diverticulosis, grade IV hem (Pyrtle)   UPPER   GASTROINTESTINAL ENDOSCOPY  10/20/2019   Barrett's, 2cm HH (Pyrtle)    FAMILY HISTORY Family History  Problem Relation Age of Onset   Diabetes Maternal Grandmother    Dementia Father        alz   CAD Father 10       5v CABG   AAA (abdominal aortic aneurysm) Father    CVA Father 65   Heart disease Father    Cancer Mother        ?ovarian cancer   Cancer Brother        esophageal cancer   Esophageal cancer Brother    Cancer Brother 74       prostate cancer   Colon cancer Neg Hx    Colon polyps Neg Hx    Stomach cancer  Neg Hx    Rectal cancer Neg Hx     SOCIAL HISTORY Social History   Tobacco Use   Smoking status: Former    Types: Cigarettes    Quit date: 03/25/1973    Years since quitting: 48.5   Smokeless tobacco: Never  Vaping Use   Vaping Use: Never used  Substance Use Topics   Alcohol use: No   Drug use: No         OPHTHALMIC EXAM:  Base Eye Exam     Visual Acuity (ETDRS)       Right Left   Dist [place] 20/20 20/20         Tonometry (Tonopen, 2:03 PM)       Right Left   Pressure 18 23         Pupils       Pupils APD   Right PERRL None   Left PERRL None         Visual Fields       Left Right    Full Full         Extraocular Movement       Right Left    Full, Ortho Full, Ortho         Neuro/Psych     Oriented x3: Yes   Mood/Affect: Normal         Dilation     Both eyes: 1.0% Mydriacyl, 2.5% Phenylephrine @ 2:03 PM           Slit Lamp and Fundus Exam     External Exam       Right Left   External Normal Normal         Slit Lamp Exam       Right Left   Lids/Lashes Normal Normal   Conjunctiva/Sclera White and quiet White and quiet   Cornea Clear Clear   Anterior Chamber Deep and quiet Deep and quiet   Iris Round and reactive Round and reactive   Lens Centered posterior chamber intraocular lens Centered posterior chamber intraocular lens   Anterior Vitreous Normal Normal         Fundus Exam       Right Left   Posterior Vitreous Posterior vitreous detachment Posterior vitreous detachment   Disc Normal, no edema no pallor Normal No pallor, no edema   C/D Ratio 0.2 0.1   Macula Normal Normal   Vessels Normal Normal   Periphery Normal Retinoschisis, stable, no new findings            IMAGING AND PROCEDURES  Imaging and Procedures for 09/25/21  OCT, Retina - OU - Both Eyes       Right Eye Quality was  good. Scan locations included subfoveal. Central Foveal Thickness: 289. Progression has been stable. Findings  include normal foveal contour.   Left Eye Quality was good. Scan locations included subfoveal. Central Foveal Thickness: 296. Progression has been stable. Findings include normal foveal contour.   Notes No active maculopathy.  History of BRVO OS.  OS, nerve with a very small collateral, no NVD.      Color Fundus Photography Optos - OU - Both Eyes       Right Eye Disc findings include normal observations. Macula : normal observations. Vessels : normal observations. Periphery : normal observations.   Left Eye Macula : normal observations. Vessels : normal observations. Periphery : normal observations.              ASSESSMENT/PLAN:  Temporal arteritis (HCC) No signs of temporal arteritis induced optic nerve change, that is to say no evidence of arteritic anterior ischemic optic neuropathy OU.  Symptomatology and evanescent blurred vision or looking through smudged vision or is not accountable by the findings of the posterior segment today.  There is no active maculopathy, there is no signs of vascular occlusion.  Patient informed that if monocular or binocular profound dimming darkening or her complete loss of vision were to ever occur he is to seek prompt evaluation in the nearest large emergency room telling that he has temporal arteritis and has had a blindness develop in 1 or both eyes.  Branch retinal vein occlusion of left eye No signs of recurrence of CME or BRVO OS, looks stabilized with collateralization of the vessels on the nerve  Type 2 diabetes mellitus with other specified complication (HCC) The nature of diabetic retinopathy was explained using the following analogy: "Retinopathy develops in the body's blood supply like salty water corrodes the linings of pipes in a house, until rust appears, then holes in the pipes develop which leak followed by destruction and loss of the pipes as the corrosion turns them to dust. In a similar fashion, Diabetes damages the blood  supply of the body by cumulative long--term elevated blood sugar, which corrodes the blood supply in the body, particularly the blood vessels supplying the retina, kidneys, and nerves".  Thus, control of blood sugar, slows the progression of the corrosive effect of diabetes mellitus.   No signs of diabetic retinopathy  Pseudophakia of both eyes Follow-up with Dr. Groat     ICD-10-CM   1. Branch retinal vein occlusion of left eye, unspecified complication status  H34.8322 OCT, Retina - OU - Both Eyes    Color Fundus Photography Optos - OU - Both Eyes    2. Temporal arteritis (HCC)  M31.6     3. Type 2 diabetes mellitus with other specified complication, without long-term current use of insulin (HCC)  E11.69     4. Pseudophakia of both eyes  Z96.1       1.  OU no signs of active AI ON in either eye.  No signs of retinal vasculitis OU.  Good acuity  2.  Patient does need visual field testing will suggest follow-up visual field testing and further analysis with Dr. Christopher Groat.  3.  Patient is still enjoys 20/20 vision but complains of this persistent blurred vision.  I have no explanation for this and will asked Dr. Groat to render his opinion.  He continues on 50 mg daily of prednisone and with no signs of active retinitis, vasculitis, arteritis of the central retinal artery perfusion or AI ON in either eye.    Ophthalmic Meds Ordered this visit:  No orders of the defined types were placed in this encounter.      Return in about 1 year (around 09/26/2022) for And follow-up in the meantime with Dr. Katy Fitch for visual field.  There are no Patient Instructions on file for this visit.   Explained the diagnoses, plan, and follow up with the patient and they expressed understanding.  Patient expressed understanding of the importance of proper follow up care.   Clent Demark  M.D. Diseases & Surgery of the Retina and Vitreous [facility] [place] 09/25/21     Abbreviations: M myopia (nearsighted); A astigmatism; H hyperopia (farsighted); P presbyopia; Mrx spectacle prescription;  CTL contact lenses; OD right eye; OS left eye; OU both eyes  XT exotropia; ET esotropia; PEK punctate epithelial keratitis; PEE punctate epithelial erosions; DES dry eye syndrome; MGD meibomian gland dysfunction; ATs artificial tears; PFAT's preservative free artificial tears; [place] nuclear sclerotic cataract; PSC posterior subcapsular cataract; ERM epi-retinal membrane; PVD posterior vitreous detachment; RD retinal detachment; DM diabetes mellitus; DR diabetic retinopathy; NPDR non-proliferative diabetic retinopathy; PDR proliferative diabetic retinopathy; CSME clinically significant macular edema; DME diabetic macular edema; dbh dot blot hemorrhages; CWS cotton wool spot; POAG primary open angle glaucoma; C/D cup-to-disc ratio; HVF humphrey visual field; GVF goldmann visual field; OCT optical coherence tomography; IOP intraocular pressure; BRVO Branch retinal vein occlusion; CRVO central retinal vein occlusion; CRAO central retinal artery occlusion; BRAO branch retinal artery occlusion; RT retinal tear; SB scleral buckle; PPV pars plana vitrectomy; VH Vitreous hemorrhage; PRP panretinal laser photocoagulation; IVK intravitreal kenalog; VMT vitreomacular traction; MH Macular hole;  NVD neovascularization of the disc; NVE neovascularization elsewhere; AREDS age related eye disease study; ARMD age related macular degeneration; POAG primary open angle glaucoma; EBMD epithelial/anterior basement membrane dystrophy; ACIOL anterior chamber intraocular lens; IOL intraocular lens; PCIOL posterior chamber intraocular lens; Phaco/IOL phacoemulsification with intraocular lens placement; [place] photorefractive keratectomy; LASIK laser assisted in situ keratomileusis; HTN hypertension; DM diabetes mellitus; COPD chronic obstructive pulmonary disease

## 2021-09-25 NOTE — Assessment & Plan Note (Signed)
No signs of temporal arteritis induced optic nerve change, that is to say no evidence of arteritic anterior ischemic optic neuropathy OU.  Symptomatology and evanescent blurred vision or looking through smudged vision or is not accountable by the findings of the posterior segment today.  There is no active maculopathy, there is no signs of vascular occlusion.  Patient informed that if monocular or binocular profound dimming darkening or her complete loss of vision were to ever occur he is to seek prompt evaluation in the nearest large emergency room telling that he has temporal arteritis and has had a blindness develop in 1 or both eyes.

## 2021-09-29 ENCOUNTER — Other Ambulatory Visit: Payer: Self-pay | Admitting: Rheumatology

## 2021-09-29 DIAGNOSIS — M47812 Spondylosis without myelopathy or radiculopathy, cervical region: Secondary | ICD-10-CM

## 2021-09-29 DIAGNOSIS — M542 Cervicalgia: Secondary | ICD-10-CM

## 2021-10-04 DIAGNOSIS — H43813 Vitreous degeneration, bilateral: Secondary | ICD-10-CM | POA: Diagnosis not present

## 2021-10-04 DIAGNOSIS — H33193 Other retinoschisis and retinal cysts, bilateral: Secondary | ICD-10-CM | POA: Diagnosis not present

## 2021-10-04 DIAGNOSIS — Z961 Presence of intraocular lens: Secondary | ICD-10-CM | POA: Diagnosis not present

## 2021-10-04 DIAGNOSIS — M316 Other giant cell arteritis: Secondary | ICD-10-CM | POA: Diagnosis not present

## 2021-10-04 DIAGNOSIS — H18513 Endothelial corneal dystrophy, bilateral: Secondary | ICD-10-CM | POA: Diagnosis not present

## 2021-10-04 DIAGNOSIS — H40013 Open angle with borderline findings, low risk, bilateral: Secondary | ICD-10-CM | POA: Diagnosis not present

## 2021-10-05 ENCOUNTER — Ambulatory Visit: Payer: PPO | Admitting: Physical Medicine & Rehabilitation

## 2021-10-09 DIAGNOSIS — Z961 Presence of intraocular lens: Secondary | ICD-10-CM | POA: Diagnosis not present

## 2021-10-09 DIAGNOSIS — M316 Other giant cell arteritis: Secondary | ICD-10-CM | POA: Diagnosis not present

## 2021-10-09 DIAGNOSIS — H18513 Endothelial corneal dystrophy, bilateral: Secondary | ICD-10-CM | POA: Diagnosis not present

## 2021-10-09 DIAGNOSIS — H43813 Vitreous degeneration, bilateral: Secondary | ICD-10-CM | POA: Diagnosis not present

## 2021-10-09 DIAGNOSIS — H33193 Other retinoschisis and retinal cysts, bilateral: Secondary | ICD-10-CM | POA: Diagnosis not present

## 2021-10-09 DIAGNOSIS — H40013 Open angle with borderline findings, low risk, bilateral: Secondary | ICD-10-CM | POA: Diagnosis not present

## 2021-10-11 DIAGNOSIS — Z7952 Long term (current) use of systemic steroids: Secondary | ICD-10-CM | POA: Diagnosis not present

## 2021-10-11 DIAGNOSIS — Z79899 Other long term (current) drug therapy: Secondary | ICD-10-CM | POA: Diagnosis not present

## 2021-10-11 DIAGNOSIS — M316 Other giant cell arteritis: Secondary | ICD-10-CM | POA: Insufficient documentation

## 2021-10-11 DIAGNOSIS — F19982 Other psychoactive substance use, unspecified with psychoactive substance-induced sleep disorder: Secondary | ICD-10-CM | POA: Diagnosis not present

## 2021-10-11 LAB — CBC AND DIFFERENTIAL
Hemoglobin: 14.9 (ref 13.5–17.5)
Platelets: 136 10*3/uL — AB (ref 150–400)
WBC: 9.5

## 2021-10-11 LAB — POCT ERYTHROCYTE SEDIMENTATION RATE, NON-AUTOMATED: Sed Rate: 7

## 2021-10-11 LAB — HEMOGLOBIN A1C: Hemoglobin A1C: 7.6

## 2021-10-13 ENCOUNTER — Ambulatory Visit
Admission: RE | Admit: 2021-10-13 | Discharge: 2021-10-13 | Disposition: A | Discharge status: Home / Self Care | Payer: PPO | Source: Ambulatory Visit | Attending: Rheumatology | Admitting: Rheumatology

## 2021-10-13 DIAGNOSIS — M542 Cervicalgia: Secondary | ICD-10-CM

## 2021-10-13 DIAGNOSIS — M4802 Spinal stenosis, cervical region: Secondary | ICD-10-CM | POA: Diagnosis not present

## 2021-10-13 DIAGNOSIS — M47812 Spondylosis without myelopathy or radiculopathy, cervical region: Secondary | ICD-10-CM | POA: Diagnosis not present

## 2021-10-13 DIAGNOSIS — M4312 Spondylolisthesis, cervical region: Secondary | ICD-10-CM | POA: Diagnosis not present

## 2021-10-16 ENCOUNTER — Encounter: Payer: Self-pay | Admitting: Family Medicine

## 2021-10-16 MED ORDER — METFORMIN HCL 500 MG PO TABS: 500.0000 mg | ORAL_TABLET | Freq: Every day | ORAL | 1 refills | Status: DC

## 2021-10-16 NOTE — Addendum Note (Signed)
Addended by: Ria Bush on: 10/16/2021 07:45 AM   Modules accepted: Orders

## 2021-10-22 ENCOUNTER — Other Ambulatory Visit: Payer: Self-pay | Admitting: Family Medicine

## 2021-10-25 NOTE — Telephone Encounter (Signed)
Prednisone Last filled:  09/26/21, #30 Last OV:  09/20/21, CPE Next OV:  12/22/21, 3 mo DM f/u

## 2021-10-27 NOTE — Telephone Encounter (Signed)
Spoke with patient's wife.  Today he's been feeling better with improved energy levels.  They are at [facility] all this week.  Continues prednisone '50mg'$  through [facility]. Planning to start tapering to '40mg'$ , working on [facility] authorization - in the mail.  Cbg today [phone] Gabapentin '300mg'$  nightly not helping sleep.

## 2021-10-27 NOTE — Telephone Encounter (Signed)
Spoke with pt and wife - plan to drop prednisone to '40mg'$  through [facility]. Will deny '50mg'$  dose request.

## 2021-11-07 ENCOUNTER — Encounter: Payer: Self-pay | Admitting: Family Medicine

## 2021-11-09 DIAGNOSIS — Z79899 Other long term (current) drug therapy: Secondary | ICD-10-CM | POA: Diagnosis not present

## 2021-11-09 DIAGNOSIS — M47812 Spondylosis without myelopathy or radiculopathy, cervical region: Secondary | ICD-10-CM | POA: Diagnosis not present

## 2021-11-09 DIAGNOSIS — M316 Other giant cell arteritis: Secondary | ICD-10-CM | POA: Diagnosis not present

## 2021-11-17 DIAGNOSIS — M316 Other giant cell arteritis: Secondary | ICD-10-CM | POA: Diagnosis not present

## 2021-11-22 ENCOUNTER — Other Ambulatory Visit (HOSPITAL_COMMUNITY): Payer: Self-pay

## 2021-11-24 ENCOUNTER — Other Ambulatory Visit: Payer: Self-pay | Admitting: Family Medicine

## 2021-11-24 DIAGNOSIS — M4316 Spondylolisthesis, lumbar region: Secondary | ICD-10-CM | POA: Diagnosis not present

## 2021-11-24 DIAGNOSIS — R202 Paresthesia of skin: Secondary | ICD-10-CM | POA: Diagnosis not present

## 2021-11-24 DIAGNOSIS — M5416 Radiculopathy, lumbar region: Secondary | ICD-10-CM

## 2021-11-24 DIAGNOSIS — R29898 Other symptoms and signs involving the musculoskeletal system: Secondary | ICD-10-CM | POA: Diagnosis not present

## 2021-11-24 DIAGNOSIS — M5136 Other intervertebral disc degeneration, lumbar region: Secondary | ICD-10-CM | POA: Diagnosis not present

## 2021-11-24 DIAGNOSIS — M5030 Other cervical disc degeneration, unspecified cervical region: Secondary | ICD-10-CM | POA: Diagnosis not present

## 2021-11-24 DIAGNOSIS — M5020 Other cervical disc displacement, unspecified cervical region: Secondary | ICD-10-CM | POA: Diagnosis not present

## 2021-11-27 ENCOUNTER — Ambulatory Visit
Admission: RE | Admit: 2021-11-27 | Discharge: 2021-11-27 | Disposition: A | Discharge status: Home / Self Care | Payer: PPO | Source: Ambulatory Visit | Attending: Family Medicine | Admitting: Family Medicine

## 2021-11-27 DIAGNOSIS — F19982 Other psychoactive substance use, unspecified with psychoactive substance-induced sleep disorder: Secondary | ICD-10-CM | POA: Diagnosis not present

## 2021-11-27 DIAGNOSIS — Z79899 Other long term (current) drug therapy: Secondary | ICD-10-CM | POA: Diagnosis not present

## 2021-11-27 DIAGNOSIS — R202 Paresthesia of skin: Secondary | ICD-10-CM | POA: Diagnosis not present

## 2021-11-27 DIAGNOSIS — M316 Other giant cell arteritis: Secondary | ICD-10-CM | POA: Diagnosis not present

## 2021-11-27 DIAGNOSIS — M5416 Radiculopathy, lumbar region: Secondary | ICD-10-CM | POA: Insufficient documentation

## 2021-11-27 DIAGNOSIS — R200 Anesthesia of skin: Secondary | ICD-10-CM | POA: Diagnosis not present

## 2021-11-27 DIAGNOSIS — G959 Disease of spinal cord, unspecified: Secondary | ICD-10-CM | POA: Diagnosis not present

## 2021-11-28 ENCOUNTER — Other Ambulatory Visit (HOSPITAL_COMMUNITY): Payer: Self-pay

## 2021-11-28 ENCOUNTER — Other Ambulatory Visit: Payer: Self-pay | Admitting: Family Medicine

## 2021-11-28 ENCOUNTER — Encounter: Payer: Self-pay | Admitting: Family Medicine

## 2021-11-28 DIAGNOSIS — Z86010 Personal history of colonic polyps: Secondary | ICD-10-CM

## 2021-11-28 DIAGNOSIS — Z800 Family history of malignant neoplasm of digestive organs: Secondary | ICD-10-CM

## 2021-11-28 DIAGNOSIS — K219 Gastro-esophageal reflux disease without esophagitis: Secondary | ICD-10-CM

## 2021-11-29 MED ORDER — PANTOPRAZOLE SODIUM 40 MG PO TBEC: 40.0000 mg | DELAYED_RELEASE_TABLET | Freq: Every day | ORAL | 3 refills | Status: DC

## 2021-12-05 DIAGNOSIS — M5412 Radiculopathy, cervical region: Secondary | ICD-10-CM | POA: Diagnosis not present

## 2021-12-05 DIAGNOSIS — M5416 Radiculopathy, lumbar region: Secondary | ICD-10-CM | POA: Diagnosis not present

## 2021-12-05 DIAGNOSIS — M5030 Other cervical disc degeneration, unspecified cervical region: Secondary | ICD-10-CM | POA: Diagnosis not present

## 2021-12-05 DIAGNOSIS — M5020 Other cervical disc displacement, unspecified cervical region: Secondary | ICD-10-CM | POA: Diagnosis not present

## 2021-12-05 DIAGNOSIS — M5136 Other intervertebral disc degeneration, lumbar region: Secondary | ICD-10-CM | POA: Diagnosis not present

## 2021-12-05 DIAGNOSIS — M5126 Other intervertebral disc displacement, lumbar region: Secondary | ICD-10-CM | POA: Diagnosis not present

## 2021-12-06 ENCOUNTER — Encounter: Payer: Self-pay | Admitting: Family Medicine

## 2021-12-09 ENCOUNTER — Other Ambulatory Visit (HOSPITAL_COMMUNITY): Payer: Self-pay

## 2021-12-11 DIAGNOSIS — H33193 Other retinoschisis and retinal cysts, bilateral: Secondary | ICD-10-CM | POA: Diagnosis not present

## 2021-12-11 DIAGNOSIS — H43813 Vitreous degeneration, bilateral: Secondary | ICD-10-CM | POA: Diagnosis not present

## 2021-12-11 DIAGNOSIS — Z961 Presence of intraocular lens: Secondary | ICD-10-CM | POA: Diagnosis not present

## 2021-12-11 DIAGNOSIS — M316 Other giant cell arteritis: Secondary | ICD-10-CM | POA: Diagnosis not present

## 2021-12-11 DIAGNOSIS — H18513 Endothelial corneal dystrophy, bilateral: Secondary | ICD-10-CM | POA: Diagnosis not present

## 2021-12-11 DIAGNOSIS — H40013 Open angle with borderline findings, low risk, bilateral: Secondary | ICD-10-CM | POA: Diagnosis not present

## 2021-12-13 NOTE — Telephone Encounter (Signed)
Is there a way to see if we've received/processed requested [facility] release form? Thanks.

## 2021-12-19 DIAGNOSIS — M79606 Pain in leg, unspecified: Secondary | ICD-10-CM | POA: Diagnosis not present

## 2021-12-21 NOTE — Telephone Encounter (Signed)
Pt seen 09/25/2021 by Dr. Deloria Lair with [facility]

## 2021-12-22 ENCOUNTER — Ambulatory Visit (INDEPENDENT_AMBULATORY_CARE_PROVIDER_SITE_OTHER): Payer: PPO | Admitting: Family Medicine

## 2021-12-22 ENCOUNTER — Encounter: Payer: Self-pay | Admitting: Family Medicine

## 2021-12-22 VITALS — BP 124/66 | HR 86 | Temp 97.4°F | Ht 71.75 in | Wt 186.8 lb

## 2021-12-22 DIAGNOSIS — M5030 Other cervical disc degeneration, unspecified cervical region: Secondary | ICD-10-CM

## 2021-12-22 DIAGNOSIS — M5416 Radiculopathy, lumbar region: Secondary | ICD-10-CM | POA: Diagnosis not present

## 2021-12-22 DIAGNOSIS — M5136 Other intervertebral disc degeneration, lumbar region: Secondary | ICD-10-CM | POA: Diagnosis not present

## 2021-12-22 DIAGNOSIS — E1169 Type 2 diabetes mellitus with other specified complication: Secondary | ICD-10-CM | POA: Diagnosis not present

## 2021-12-22 DIAGNOSIS — M316 Other giant cell arteritis: Secondary | ICD-10-CM

## 2021-12-22 DIAGNOSIS — Z77098 Contact with and (suspected) exposure to other hazardous, chiefly nonmedicinal, chemicals: Secondary | ICD-10-CM | POA: Diagnosis not present

## 2021-12-22 DIAGNOSIS — M48062 Spinal stenosis, lumbar region with neurogenic claudication: Secondary | ICD-10-CM | POA: Diagnosis not present

## 2021-12-22 NOTE — Progress Notes (Unsigned)
Patient ID: Jonathon Chavez, male    DOB: September 27, 1945, 76 y.o.   MRN: [id number]  This visit was conducted in person.  BP 124/66   Pulse 86   Temp (!) 97.4 F (36.3 C) (Temporal)   Ht 5' 11.75" (1.822 m)   Wt 186 lb 12.8 oz (84.7 kg)   SpO2 97%   BMI 25.51 kg/m    CC: 3 mo DM f/u visit  Subjective:   HPI: Jonathon Chavez is a 77 y.o. male presenting on 12/22/2021 for Diabetes (Here for 3 mo f/u.  Pt accompanied by wife, [name]. )   Saw Dr Sharlet [place] today s/p bilateral ESI into lumbar back.  Daughter flies in once monthly from [place] to help prep keto meals for the month.   Temporal arteritis- sees Rheumatology PA Gwyndolyn Saxon [place], currentlly on prednisone 7m as well as actemra weekly injection. Likely planned continue prednisone taper next week. Still feels tired, weak but does not describe true muscle weakness.  Notes ongoing blurry vision, also found to have elevated left eye pressure treating with bilateral eye drops. Regularly sees Dr GKaty Fitch Has previously seen Dr RZadie Rhinefor left eye branch retinal vein occlusion  DM - does regularly check sugars 160-190s, today fasting 109. Compliant with antihyperglycemic regimen which includes: metformin 5041mdaily. Denies low sugars or hypoglycemic symptoms. Ongoing blurry vision and paresthesias. Last diabetic eye exam 09/2021. Glucometer brand: one touch delica plus. Last foot exam: due. DSME: declines - he's been following keto diet. Lab Results  Component Value Date   HGBA1C 7.6 10/11/2021   Diabetic Foot Exam - Simple   Simple Foot Form Diabetic Foot exam was performed with the following findings: Yes 12/22/2021  3:36 PM  Visual Inspection No deformities, no ulcerations, no other skin breakdown bilaterally: Yes Sensation Testing Intact to touch and monofilament testing bilaterally: Yes Pulse Check Posterior Tibialis and Dorsalis pulse intact bilaterally: Yes Comments Vitiligo present BLE    Lab Results  Component Value  Date   MICROALBUR <0.7 08/29/2017        Relevant past medical, surgical, family and social history reviewed and updated as indicated. Interim medical history since our last visit reviewed. Allergies and medications reviewed and updated. Outpatient Medications Prior to Visit  Medication Sig Dispense Refill   ACTEMRA ACTPEN 162 MG/0.9ML SOAJ Inject into the skin.     Ascorbic Acid (VITAMIN C) 1000 MG tablet Take 1,000 mg by mouth daily.     Blood Glucose Monitoring Suppl (ONETOUCH VERIO FLEX SYSTEM) w/Device KIT Use as instructed to check blood sugar once a day 1 kit 0   glucose blood (ONETOUCH VERIO) test strip Use as instructed to check blood sugar once a day 100 each 3   HYDROcodone-acetaminophen (NORCO/VICODIN) 5-325 MG tablet Take 0.5-1 tablets by mouth 3 (three) times daily as needed for moderate pain. 15 tablet 0   Lancets (ONETOUCH DELICA PLUS [id number] Use as instructed to check blood sugar once a day 100 each 3   losartan (COZAAR) 50 MG tablet TAKE 1 TABLET BY MOUTH EVERY DAY 90 tablet 3   metFORMIN (GLUCOPHAGE) 500 MG tablet Take 1 tablet (500 mg total) by mouth daily with breakfast. 90 tablet 1   pantoprazole (PROTONIX) 40 MG tablet Take 1 tablet by mouth daily. 90 tablet 3   gabapentin (NEURONTIN) 100 MG capsule Take 100 mg by mouth at bedtime. Takes 4 capsules     predniSONE (DELTASONE) 10 MG tablet Take 10 mg by mouth daily with  breakfast. Takes 2 tablets daily     gabapentin (NEURONTIN) 100 MG capsule Take 4 capsules (400 mg total) by mouth at bedtime.     predniSONE (DELTASONE) 10 MG tablet Take 2 tablets (20 mg total) by mouth daily with breakfast.     predniSONE (DELTASONE) 50 MG tablet Take 1 tablet (50 mg total) by mouth daily with breakfast. 30 tablet 0   No facility-administered medications prior to visit.     Per HPI unless specifically indicated in ROS section below Review of Systems  Objective:  BP 124/66   Pulse 86   Temp (!) 97.4 F (36.3 C)  (Temporal)   Ht 5' 11.75" (1.822 m)   Wt 186 lb 12.8 oz (84.7 kg)   SpO2 97%   BMI 25.51 kg/m   Wt Readings from Last 3 Encounters:  12/22/21 186 lb 12.8 oz (84.7 kg)  09/20/21 180 lb 6 oz (81.8 kg)  09/18/21 177 lb 12.8 oz (80.6 kg)      Physical Exam Vitals and nursing note reviewed.  Constitutional:      Appearance: Normal appearance. He is not ill-appearing.  HENT:     Head: Normocephalic and atraumatic.     Mouth/Throat:     Mouth: Mucous membranes are moist.     Pharynx: Oropharynx is clear. No oropharyngeal exudate or posterior oropharyngeal erythema.  Eyes:     Extraocular Movements: Extraocular movements intact.     Conjunctiva/sclera: Conjunctivae normal.     Pupils: Pupils are equal, round, and reactive to light.  Cardiovascular:     Rate and Rhythm: Normal rate and regular rhythm.     Pulses: Normal pulses.     Heart sounds: Normal heart sounds. No murmur heard. Pulmonary:     Effort: Pulmonary effort is normal. No respiratory distress.     Breath sounds: Normal breath sounds. No wheezing, rhonchi or rales.  Musculoskeletal:     Right lower leg: No edema.     Left lower leg: No edema.     Comments: See HPI for foot exam if done  Skin:    General: Skin is warm and dry.     Findings: No rash.     Comments: Vitiligo   Neurological:     Mental Status: He is alert.     Sensory: Sensation is intact.     Motor: Motor function is intact.     Coordination: Coordination is intact. Romberg sign negative.     Comments:  CN grossly intact Diminished DTRs BLE 5/5 strength BLE BLE sensation intact to light touch  Psychiatric:        Mood and Affect: Mood normal.        Behavior: Behavior normal.       Results for orders placed or performed in visit on 10/16/21  CBC and differential  Result Value Ref Range   Hemoglobin 14.9 13.5 - 17.5   Platelets 136 (A) 150 - 400 K/uL   WBC 9.5   Hemoglobin A1c  Result Value Ref Range   Hemoglobin A1C 7.6   POCT  erythrocyte sed rate, Non-automated  Result Value Ref Range   Sed Rate 7     Assessment & Plan:   Problem List Items Addressed This Visit     DDD (degenerative disc disease), cervical    Appreciate PMR care       Relevant Medications   ACTEMRA ACTPEN 162 MG/0.9ML SOAJ   predniSONE (DELTASONE) 10 MG tablet   Temporal arteritis ([place])  Continues seeing rheumatology, now on weekly Actemra with plan to continue prednisone taper.  However he notes ongoing fatigue, general weakness and blurry vision. Eye doctor does not think blurry vision related to TA.       Type 2 diabetes mellitus with other specified complication ([place]) - Primary    Anticipate steroid-induced diabetes, managing with metformin 558m once daily. He does not sugar levels are slowly trending down in setting of lower prednisone dose. Will check fructosamine levels today, consider increased metformin if needed.  Foot exam today.       Relevant Orders   Fructosamine   Agent orange exposure    [phone]  [place]       DDD (degenerative disc disease), lumbar    Sees PM&R (Chasnis, Whitney Meeler), s/p bilateral lumbar ESI today.       Relevant Medications   ACTEMRA ACTPEN 162 MG/0.9ML SOAJ   predniSONE (DELTASONE) 10 MG tablet     No orders of the defined types were placed in this encounter.  Orders Placed This Encounter  Procedures   Fructosamine     Patient Instructions  Lab test today.  Continue current medicines You are doing well today Return in 3 months for diabetes check.   Follow up plan: Return in about 3 months (around 03/24/2022), or if symptoms worsen or fail to improve, for follow up visit.  JRia Bush MD

## 2021-12-22 NOTE — Patient Instructions (Addendum)
Lab test today.  Continue current medicines You are doing well today Return in 3 months for diabetes check.

## 2021-12-23 ENCOUNTER — Encounter: Payer: Self-pay | Admitting: Family Medicine

## 2021-12-23 DIAGNOSIS — M5136 Other intervertebral disc degeneration, lumbar region: Secondary | ICD-10-CM | POA: Insufficient documentation

## 2021-12-23 NOTE — Assessment & Plan Note (Signed)
Continues seeing rheumatology, now on weekly Actemra with plan to continue prednisone taper.  However he notes ongoing fatigue, general weakness and blurry vision. Eye doctor does not think blurry vision related to TA.

## 2021-12-23 NOTE — Assessment & Plan Note (Signed)
Appreciate PMR care

## 2021-12-23 NOTE — Assessment & Plan Note (Signed)
Sees PM&R (Chasnis, Whitney Meeler), s/p bilateral lumbar ESI today.

## 2021-12-23 NOTE — Assessment & Plan Note (Signed)
H/o this while in [employer] [place] during [place] war [phone]).  He is looking [place] assistance.

## 2021-12-23 NOTE — Assessment & Plan Note (Signed)
Anticipate steroid-induced diabetes, managing with metformin '500mg'$  once daily. He does not sugar levels are slowly trending down in setting of lower prednisone dose. Will check fructosamine levels today, consider increased metformin if needed.  Foot exam today.

## 2021-12-27 NOTE — Telephone Encounter (Signed)
I have reviewed the chart looks like it was processed in 10/30.

## 2021-12-28 DIAGNOSIS — M79606 Pain in leg, unspecified: Secondary | ICD-10-CM | POA: Diagnosis not present

## 2021-12-29 LAB — FRUCTOSAMINE: Fructosamine: 270 umol/L (ref 205–285)

## 2022-01-02 DIAGNOSIS — M316 Other giant cell arteritis: Secondary | ICD-10-CM | POA: Diagnosis not present

## 2022-01-02 DIAGNOSIS — Z79899 Other long term (current) drug therapy: Secondary | ICD-10-CM | POA: Diagnosis not present

## 2022-01-02 DIAGNOSIS — G959 Disease of spinal cord, unspecified: Secondary | ICD-10-CM | POA: Diagnosis not present

## 2022-01-02 DIAGNOSIS — E1165 Type 2 diabetes mellitus with hyperglycemia: Secondary | ICD-10-CM | POA: Diagnosis not present

## 2022-01-02 DIAGNOSIS — R4589 Other symptoms and signs involving emotional state: Secondary | ICD-10-CM | POA: Diagnosis not present

## 2022-01-02 DIAGNOSIS — F19982 Other psychoactive substance use, unspecified with psychoactive substance-induced sleep disorder: Secondary | ICD-10-CM | POA: Diagnosis not present

## 2022-01-09 ENCOUNTER — Ambulatory Visit (INDEPENDENT_AMBULATORY_CARE_PROVIDER_SITE_OTHER): Payer: PPO

## 2022-01-09 VITALS — Ht 71.75 in | Wt 186.0 lb

## 2022-01-09 DIAGNOSIS — Z0000 Encounter for general adult medical examination without abnormal findings: Secondary | ICD-10-CM | POA: Diagnosis not present

## 2022-01-09 NOTE — Patient Instructions (Signed)
Jonathon Chavez , Thank you for taking time to come for your Medicare Wellness Visit. I appreciate your ongoing commitment to your health goals. Please review the following plan we discussed and let me know if I can assist you in the future.   Screening recommendations/referrals: Colonoscopy: 10/20/19 Recommended yearly ophthalmology/optometry visit for glaucoma screening and checkup Recommended yearly dental visit for hygiene and checkup  Vaccinations: Influenza vaccine: n/d Pneumococcal vaccine: 02/08/14 Tdap vaccine: 02/06/13 Shingles vaccine: Zostavax 08/28/10   Covid-19: 07/27/19, 09/22/19  Advanced directives: no  Conditions/risks identified: none  Next appointment: Follow up in one year for your annual wellness visit. 01/11/23 @ 8:45 am by phone  Preventive Care 65 Years and Older, Male Preventive care refers to lifestyle choices and visits with your health care provider that can promote health and wellness. What does preventive care include? A yearly physical exam. This is also called an annual well check. Dental exams once or twice a year. Routine eye exams. Ask your health care provider how often you should have your eyes checked. Personal lifestyle choices, including: Daily care of your teeth and gums. Regular physical activity. Eating a healthy diet. Avoiding tobacco and drug use. Limiting alcohol use. Practicing safe sex. Taking low doses of aspirin every day. Taking vitamin and mineral supplements as recommended by your health care provider. What happens during an annual well check? The services and screenings done by your health care provider during your annual well check will depend on your age, overall health, lifestyle risk factors, and family history of disease. Counseling  Your health care provider may ask you questions about your: Alcohol use. Tobacco use. Drug use. Emotional well-being. Home and relationship well-being. Sexual activity. Eating habits. History  of falls. Memory and ability to understand (cognition). Work and work Statistician. Screening  You may have the following tests or measurements: Height, weight, and BMI. Blood pressure. Lipid and cholesterol levels. These may be checked every 5 years, or more frequently if you are over 39 years old. Skin check. Lung cancer screening. You may have this screening every year starting at age 40 if you have a 30-pack-year history of smoking and currently smoke or have quit within the past 15 years. Fecal occult blood test (FOBT) of the stool. You may have this test every year starting at age 53. Flexible sigmoidoscopy or colonoscopy. You may have a sigmoidoscopy every 5 years or a colonoscopy every 10 years starting at age 44. Prostate cancer screening. Recommendations will vary depending on your family history and other risks. Hepatitis C blood test. Hepatitis B blood test. Sexually transmitted disease (STD) testing. Diabetes screening. This is done by checking your blood sugar (glucose) after you have not eaten for a while (fasting). You may have this done every 1-3 years. Abdominal aortic aneurysm (AAA) screening. You may need this if you are a current or former smoker. Osteoporosis. You may be screened starting at age 25 if you are at high risk. Talk with your health care provider about your test results, treatment options, and if necessary, the need for more tests. Vaccines  Your health care provider may recommend certain vaccines, such as: Influenza vaccine. This is recommended every year. Tetanus, diphtheria, and acellular pertussis (Tdap, Td) vaccine. You may need a Td booster every 10 years. Zoster vaccine. You may need this after age 59. Pneumococcal 13-valent conjugate (PCV13) vaccine. One dose is recommended after age 74 Pneumococcal polysaccharide (PPSV23) vaccine. One dose is recommended after age 74. Talk to your health care  provider about which screenings and vaccines you need  and how often you need them. This information is not intended to replace advice given to you by your health care provider. Make sure you discuss any questions you have with your health care provider. Document Released: 03/04/2015 Document Revised: 10/26/2015 Document Reviewed: 12/07/2014 Elsevier Interactive Patient Education  2017 [employer] Prevention in the Home Falls can cause injuries. They can happen to people of all ages. There are many things you can do to make your home safe and to help prevent falls. What can I do on the outside of my home? Regularly fix the edges of walkways and driveways and fix any cracks. Remove anything that might make you trip as you walk through a door, such as a raised step or threshold. Trim any bushes or trees on the path to your home. Use bright outdoor lighting. Clear any walking paths of anything that might make someone trip, such as rocks or tools. Regularly check to see if handrails are loose or broken. Make sure that both sides of any steps have handrails. Any raised decks and porches should have guardrails on the edges. Have any leaves, snow, or ice cleared regularly. Use sand or salt on walking paths during winter. Clean up any spills in your garage right away. This includes oil or grease spills. What can I do in the bathroom? Use night lights. Install grab bars by the toilet and in the tub and shower. Do not use towel bars as grab bars. Use non-skid mats or decals in the tub or shower. If you need to sit down in the shower, use a plastic, non-slip stool. Keep the floor dry. Clean up any water that spills on the floor as soon as it happens. Remove soap buildup in the tub or shower regularly. Attach bath mats securely with double-sided non-slip rug tape. Do not have throw rugs and other things on the floor that can make you trip. What can I do in the bedroom? Use night lights. Make sure that you have a light by your bed that is easy  to reach. Do not use any sheets or blankets that are too big for your bed. They should not hang down onto the floor. Have a firm chair that has side arms. You can use this for support while you get dressed. Do not have throw rugs and other things on the floor that can make you trip. What can I do in the kitchen? Clean up any spills right away. Avoid walking on wet floors. Keep items that you use a lot in easy-to-reach places. If you need to reach something above you, use a strong step stool that has a grab bar. Keep electrical cords out of the way. Do not use floor polish or wax that makes floors slippery. If you must use wax, use non-skid floor wax. Do not have throw rugs and other things on the floor that can make you trip. What can I do with my stairs? Do not leave any items on the stairs. Make sure that there are handrails on both sides of the stairs and use them. Fix handrails that are broken or loose. Make sure that handrails are as long as the stairways. Check any carpeting to make sure that it is firmly attached to the stairs. Fix any carpet that is loose or worn. Avoid having throw rugs at the top or bottom of the stairs. If you do have throw rugs, attach them to the  floor with carpet tape. Make sure that you have a light switch at the top of the stairs and the bottom of the stairs. If you do not have them, ask someone to add them for you. What else can I do to help prevent falls? Wear shoes that: Do not have high heels. Have rubber bottoms. Are comfortable and fit you well. Are closed at the toe. Do not wear sandals. If you use a stepladder: Make sure that it is fully opened. Do not climb a closed stepladder. Make sure that both sides of the stepladder are locked into place. Ask someone to hold it for you, if possible. Clearly mark and make sure that you can see: Any grab bars or handrails. First and last steps. Where the edge of each step is. Use tools that help you move  around (mobility aids) if they are needed. These include: Canes. Walkers. Scooters. Crutches. Turn on the lights when you go into a dark area. Replace any light bulbs as soon as they burn out. Set up your furniture so you have a clear path. Avoid moving your furniture around. If any of your floors are uneven, fix them. If there are any pets around you, be aware of where they are. Review your medicines with your doctor. Some medicines can make you feel dizzy. This can increase your chance of falling. Ask your doctor what other things that you can do to help prevent falls. This information is not intended to replace advice given to you by your health care provider. Make sure you discuss any questions you have with your health care provider. Document Released: 12/02/2008 Document Revised: 07/14/2015 Document Reviewed: 03/12/2014 Elsevier Interactive Patient Education  2017 [employer].

## 2022-01-09 NOTE — Progress Notes (Signed)
Virtual Visit via Telephone Note  I connected with  Jonathon Chavez on 01/09/22 at 11:00 AM EST by telephone and verified that I am speaking with the correct person using two identifiers.  Location: Patient: home Provider: [place] Persons participating in the virtual visit: [place]   I discussed the limitations, risks, security and privacy concerns of performing an evaluation and management service by telephone and the availability of in person appointments. The patient expressed understanding and agreed to proceed.  Interactive audio and video telecommunications were attempted between this nurse and patient, however failed, due to patient having technical difficulties OR patient did not have access to video capability.  We continued and completed visit with audio only.  Some vital signs may be absent or patient reported.   Dionisio David, LPN  Subjective:   Jonathon Chavez is a 76 y.o. male who presents for Medicare Annual/Subsequent preventive examination.  Review of Systems     Cardiac Risk Factors include: advanced age (>9mn, >>15women);hypertension;male gender;diabetes mellitus     Objective:    There were no vitals filed for this visit. There is no height or weight on file to calculate BMI.     01/09/2022   11:01 AM 09/18/2021   12:41 PM 07/11/2021    1:31 PM 01/04/2021   10:42 AM 01/01/2020   10:35 AM 08/29/2017   12:20 PM 03/21/2017    3:26 PM  Advanced Directives  Does Patient Have a Medical Advance Directive? No Yes No Yes Yes No Yes  Type of ATheatre stage managerof [place]rentwoodLiving will HNew FreeportLiving will  HFree[place]Living will  Does patient want to make changes to medical advance directive?  No - Patient declined  Yes (MAU/Ambulatory/Procedural Areas - Information given)     Copy of [place]roll[place]in Chart?     No - copy requested    Would patient like  information on creating a medical advance directive? No - Patient declined  No - Patient declined   No - Patient declined     Current Medications (verified) Outpatient Encounter Medications as of 01/09/2022  Medication Sig   ACTEMRA ACTPEN 162 MG/0.9ML SOAJ Inject into the skin.   Ascorbic Acid (VITAMIN C) 1000 MG tablet Take 1,000 mg by mouth daily.   Blood Glucose Monitoring Suppl (ONETOUCH VERIO FLEX SYSTEM) w/Device KIT Use as instructed to check blood sugar once a day   dorzolamide-timolol (COSOPT) 2-0.5 % ophthalmic solution 1 drop 2 (two) times daily.   gabapentin (NEURONTIN) 100 MG capsule Take 4 capsules (400 mg total) by mouth at bedtime.   glucose blood (ONETOUCH VERIO) test strip Use as instructed to check blood sugar once a day   Lancets (ONETOUCH DELICA PLUS [id number] MISC Use as instructed to check blood sugar once a day   losartan (COZAAR) 50 MG tablet TAKE 1 TABLET BY MOUTH EVERY DAY   metFORMIN (GLUCOPHAGE) 500 MG tablet Take 1 tablet (500 mg total) by mouth daily with breakfast.   pantoprazole (PROTONIX) 40 MG tablet Take 1 tablet by mouth daily.   predniSONE (DELTASONE) 10 MG tablet Take 2 tablets (20 mg total) by mouth daily with breakfast.   HYDROcodone-acetaminophen (NORCO/VICODIN) 5-325 MG tablet Take 0.5-1 tablets by mouth 3 (three) times daily as needed for moderate pain. (Patient not taking: Reported on 01/09/2022)   No facility-administered encounter medications on file as of 01/09/2022.    Allergies (verified) Lisinopril  History: Past Medical History:  Diagnosis Date   BPH (benign prostatic hypertrophy)    GERD (gastroesophageal reflux disease)    Hemorrhoids    Hypertension    Past Surgical History:  Procedure Laterality Date   ARTERY BIOPSY N/A 09/18/2021   Procedure: BIOPSY TEMPORAL ARTERY;  Surgeon: Herbert Pun, MD;  Location: [facility] ORS;  Service: General;  Laterality: N/A;   CATARACT EXTRACTION Bilateral 2018   Dr Clent Jacks    CHOLECYSTECTOMY  8/14   Dr Pat Patrick   COLONOSCOPY  03/2013   sessile serrated polyp, diverticulosis, rpt 5 yrs (Pyrtle)   COLONOSCOPY  10/20/2019   TA, SSP, severe diverticulosis, grade IV hem (Pyrtle)   UPPER GASTROINTESTINAL ENDOSCOPY  10/20/2019   Barrett's, 2cm HH (Pyrtle)   Family History  Problem Relation Age of Onset   Diabetes Maternal Grandmother    Dementia Father        alz   CAD Father 23       5v CABG   AAA (abdominal aortic aneurysm) Father    CVA Father 12   Heart disease Father    Cancer Mother        ?ovarian cancer   Cancer Brother        esophageal cancer   Esophageal cancer Brother    Cancer Brother 24       prostate cancer   Colon cancer Neg Hx    Colon polyps Neg Hx    Stomach cancer Neg Hx    Rectal cancer Neg Hx    Social History   Socioeconomic History   Marital status: Married    Spouse name: Not on file   Number of children: 1   Years of education: Not on file   Highest education level: Not on file  Occupational History   Occupation: truck Education administrator: [employer]  Tobacco Use   Smoking status: Former    Types: Cigarettes    Quit date: 03/25/1973    Years since quitting: 48.8   Smokeless tobacco: Never  Vaping Use   Vaping Use: Never used  Substance and Sexual Activity   Alcohol use: No   Drug use: No   Sexual activity: Not on file  Other Topics Concern   Not on file  Social History Narrative   Lives with wife.    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 - did have agent orange exposure   Activity: walks dog, physical work   Diet: good water, fruits/vegetables daily   Social Determinants of Radio broadcast assistant Strain: Low Risk  (01/09/2022)   Overall Financial Resource Strain (CARDIA)    Difficulty of Paying Living Expenses: Not hard at all  Food Insecurity: No Food Insecurity (01/09/2022)   Hunger Vital Sign    Worried About Running Out of Food in the Last  Year: Never true    [place] in the Last Year: Never true  Transportation Needs: No Transportation Needs (01/09/2022)   PRAPARE - Hydrologist (Medical): No    Lack of Transportation (Non-Medical): No  Physical Activity: Insufficiently Active (01/09/2022)   Exercise Vital Sign    Days of Exercise per Week: 4 days    Minutes of Exercise per Session: 30 min  Stress: No Stress Concern Present (01/09/2022)   [place]    Feeling of Stress : Not  at all  Social Connections: Moderately Integrated (01/09/2022)   Social Connection and Isolation Panel [NHANES]    Frequency of Communication with Friends and Family: More than three times a week    Frequency of Social Gatherings with Friends and Family: More than three times a week    Attends Religious Services: More than 4 times per year    Active Member of [employer] or Organizations: No    Attends Music therapist: Never    Marital Status: Married    Tobacco Counseling Counseling given: Not Answered   Clinical Intake:  Pre-visit preparation completed: Yes  Pain : No/denies pain     Nutritional Risks: None Diabetes: Yes CBG done?: No Did pt. bring in CBG monitor from home?: No  How often do you need to have someone help you when you read instructions, pamphlets, or other written materials from your doctor or pharmacy?: 1 - Never  Diabetic?yes Nutrition Risk Assessment:  Has the patient had any N/V/D within the last 2 months?  No  Does the patient have any non-healing wounds?  No  Has the patient had any unintentional weight loss or weight gain?  No   Diabetes:  Is the patient diabetic?  Yes  If diabetic, was a CBG obtained today?  No  Did the patient bring in their glucometer from home?  No  How often do you monitor your CBG's? daily  Financial Strains and Diabetes Management:  Are you having any financial  strains with the device, your supplies or your medication? No .  Does the patient want to be seen by Chronic Care Management for management of their diabetes?  No  Would the patient like to be referred to a Nutritionist or for Diabetic Management?  No   Diabetic Exams:  Diabetic Eye Exam: Completed 09/25/21.  Pt has been advised about the importance in completing this exam.   Diabetic Foot Exam: Completed 12/22/21. Pt has been advised about the importance in completing this exam.    Interpreter Needed?: No  Information entered by :: Kirke Shaggy, LPN   Activities of Daily Living    01/09/2022   11:02 AM  In your present state of health, do you have any difficulty performing the following activities:  Hearing? 0  Vision? 0  Difficulty concentrating or making decisions? 0  Walking or climbing stairs? 0  Dressing or bathing? 0  Doing errands, shopping? 0  Preparing Food and eating ? N  Using the Toilet? N  In the past six months, have you accidently leaked urine? N  Do you have problems with loss of bowel control? N  Managing your Medications? N  Managing your Finances? N  Housekeeping or managing your Housekeeping? N    Patient Care Team: Ria Bush, MD as PCP - General (Family Medicine)  Indicate any recent Medical Services you may have received from other than [facility] providers in the past year (date may be approximate).     Assessment:   This is a routine wellness examination for [place].  Hearing/Vision screen Hearing Screening - Comments:: No aids Vision Screening - Comments:: Readers- Dr.Groat  Dietary issues and exercise activities discussed: Current Exercise Habits: Home exercise routine, Type of exercise: walking, Time (Minutes): 30, Frequency (Times/Week): 4, Weekly Exercise (Minutes/Week): 120, Intensity: Mild   Goals Addressed             This Visit's Progress    DIET - EAT MORE FRUITS AND VEGETABLES  Depression Screen    01/09/2022    11:00 AM 01/04/2021   10:45 AM 01/01/2020   10:37 AM 12/25/2018   11:36 AM 08/29/2017   12:19 PM 08/27/2016    2:49 PM 02/08/2014   11:53 AM  PHQ 2/9 Scores  PHQ - 2 Score 0 0 0 0 0 0 0  PHQ- 9 Score 0  0  0      Fall Risk    01/09/2022   11:02 AM 01/04/2021   10:44 AM 01/01/2020   10:36 AM 09/15/2019    1:50 PM 12/25/2018   11:36 AM  Fall Risk   Falls in the past year? 0 0 0 0 0  Comment    Emmi Telephone Survey: data to providers prior to load   Number falls in past yr: 0 0 0    Injury with Fall? 0 0 0    Risk for fall due to : No Fall Risks No Fall Risks Medication side effect    Follow up Falls prevention discussed;Falls evaluation completed Falls prevention discussed Falls evaluation completed;Falls prevention discussed      FALL RISK PREVENTION PERTAINING TO THE HOME:  Any stairs in or around the home? Yes  If so, are there any without handrails? No  Home free of loose throw rugs in walkways, pet beds, electrical cords, etc? Yes  Adequate lighting in your home to reduce risk of falls? Yes   ASSISTIVE DEVICES UTILIZED TO PREVENT FALLS:  Life alert? No  Use of a cane, walker or w/c? No  Grab bars in the bathroom? No  Shower chair or bench in shower? No  Elevated toilet seat or a handicapped toilet? No    Cognitive Function:    01/01/2020   10:38 AM 08/29/2017   12:19 PM  MMSE - Mini Mental State Exam  Orientation to time 5 5  Orientation to Place 5 5  Registration 3 3  Attention/ Calculation 5 0  Recall 3 3  Language- name 2 objects  0  Language- repeat 1 1  Language- follow 3 step command  3  Language- read & follow direction  0  Write a sentence  0  Copy design  0  Total score  20        01/09/2022   11:02 AM  6CIT Screen  What Year? 0 points  What month? 0 points  What time? 0 points  Count back from 20 0 points  Months in reverse 0 points  Repeat phrase 0 points  Total Score 0 points    Immunizations Immunization History  Administered  Date(s) Administered   Fluad Quad(high Dose 65+) 12/25/2018   Influenza Split 12/24/2012   Influenza-Unspecified 11/30/2013, 11/08/2014, 12/05/2015, 11/22/2016   PFIZER(Purple Top)SARS-COV-2 Vaccination 07/27/2019, 09/22/2019   Pneumococcal Conjugate-13 02/08/2014   Pneumococcal Polysaccharide-23 02/06/2013   Td 02/20/2003   Tdap 02/06/2013   Zoster, Live 08/28/2010    TDAP status: Up to date  Flu Vaccine status: Due, Education has been provided regarding the importance of this vaccine. Advised may receive this vaccine at local pharmacy or Health Dept. Aware to provide a copy of the vaccination record if obtained from local pharmacy or Health Dept. Verbalized acceptance and understanding.  Pneumococcal vaccine status: Up to date  Covid-19 vaccine status: Completed vaccines  Qualifies for Shingles Vaccine? Yes   Zostavax completed Yes   Shingrix Completed?: No.    Education has been provided regarding the importance of this vaccine. Patient has been advised to call insurance  company to determine out of pocket expense if they have not yet received this vaccine. Advised may also receive vaccine at local pharmacy or Health Dept. Verbalized acceptance and understanding.  Screening Tests Health Maintenance  Topic Date Due   Zoster Vaccines- Shingrix (1 of 2) Never done   Diabetic kidney evaluation - Urine ACR  08/30/2018   COVID-19 Vaccine (3 - Pfizer risk series) 10/20/2019   INFLUENZA VACCINE  05/20/2022 (Originally 09/19/2021)   HEMOGLOBIN A1C  04/13/2022   Diabetic kidney evaluation - GFR measurement  09/14/2022   OPHTHALMOLOGY EXAM  09/26/2022   FOOT EXAM  12/23/2022   Medicare Annual Wellness (AWV)  01/10/2023   COLONOSCOPY (Pts 45-29yr Insurance coverage will need to be confirmed)  10/19/2024   Pneumonia Vaccine 76 Years old  Completed   Hepatitis C Screening  Completed   HPV VACCINES  Aged Out    Health Maintenance  Health Maintenance Due  Topic Date Due   Zoster  Vaccines- Shingrix (1 of 2) Never done   Diabetic kidney evaluation - Urine ACR  08/30/2018   COVID-19 Vaccine (3 - Pfizer risk series) 10/20/2019    Colorectal cancer screening: Type of screening: Colonoscopy. Completed 10/20/19. Repeat every 5 years  Lung Cancer Screening: (Low Dose CT Chest recommended if Age 74-80years, 30 pack-year currently smoking OR have quit w/in 15years.) does not qualify.   Additional Screening:  Hepatitis C Screening: does qualify; Completed 08/29/17  Vision Screening: Recommended annual ophthalmology exams for early detection of glaucoma and other disorders of the eye. Is the patient up to date with their annual eye exam?  Yes  Who is the provider or what is the name of the office in which the patient attends annual eye exams? Dr.Groat If pt is not established with a provider, would they like to be referred to a provider to establish care? No .   Dental Screening: Recommended annual dental exams for proper oral hygiene  Community Resource Referral / Chronic Care Management: CRR required this visit?  No   CCM required this visit?  No      Plan:     I have personally reviewed and noted the following in the patient's chart:   Medical and social history Use of alcohol, tobacco or illicit drugs  Current medications and supplements including opioid prescriptions. Patient is not currently taking opioid prescriptions. Functional ability and status Nutritional status Physical activity Advanced directives List of other physicians Hospitalizations, surgeries, and ER visits in previous 12 months Vitals Screenings to include cognitive, depression, and falls Referrals and appointments  In addition, I have reviewed and discussed with patient certain preventive protocols, quality metrics, and best practice recommendations. A written personalized care plan for preventive services as well as general preventive health recommendations were provided to patient.      LDionisio David LPN   [id number]  Nurse Notes: none

## 2022-01-10 ENCOUNTER — Encounter: Payer: Self-pay | Admitting: Nurse Practitioner

## 2022-01-10 ENCOUNTER — Ambulatory Visit (INDEPENDENT_AMBULATORY_CARE_PROVIDER_SITE_OTHER): Payer: PPO | Admitting: Nurse Practitioner

## 2022-01-10 VITALS — BP 118/72 | HR 85 | Temp 97.9°F | Resp 16 | Ht 71.75 in | Wt 188.0 lb

## 2022-01-10 DIAGNOSIS — R0602 Shortness of breath: Secondary | ICD-10-CM | POA: Insufficient documentation

## 2022-01-10 DIAGNOSIS — J069 Acute upper respiratory infection, unspecified: Secondary | ICD-10-CM

## 2022-01-10 MED ORDER — ALBUTEROL SULFATE HFA 108 (90 BASE) MCG/ACT IN AERS: 2.0000 | INHALATION_SPRAY | Freq: Four times a day (QID) | RESPIRATORY_TRACT | 0 refills | Status: DC | PRN

## 2022-01-10 MED ORDER — DOXYCYCLINE HYCLATE 100 MG PO TABS: 100.0000 mg | ORAL_TABLET | Freq: Two times a day (BID) | ORAL | 0 refills | Status: AC

## 2022-01-10 NOTE — Assessment & Plan Note (Signed)
Patient states some shortness of breath when he lies down at night.  Lungs clear to auscultation will treat with antibiotic.  Patient given albuterol inhaler to use as needed basis.  Follow-up if no improvement

## 2022-01-10 NOTE — Assessment & Plan Note (Signed)
Given length of symptoms patient presentation and him being on immunocompromising medications will like to treat with doxycycline 100 mg twice daily for 7 days.  Patient will follow-up if no improvement.

## 2022-01-10 NOTE — Patient Instructions (Signed)
Nice to see you today I have sent in medications to the pharmacy Follow up if you do not improve

## 2022-01-10 NOTE — Progress Notes (Signed)
Acute Office Visit  Subjective:     Patient ID: Jonathon Chavez, male    DOB: 1945-07-18, 76 y.o.   MRN: [id number]  Chief Complaint  Patient presents with   Cough    X 1 week having a lot of mucus     HPI Patient is in today for Cough  States that he has been around a lot of sick people, church is the vector point.  Patient spouse at bedside states that she had the same kind of symptoms a week prior to patient No covid test at home.  Has had pfizer vaccine x2 Flu vaccine is not up to date He is on immunocompromising medications through Rheumatology  Has not tried over the counter medication    Review of Systems  Constitutional:  Positive for malaise/fatigue. Negative for chills and fever.  HENT:  Positive for sinus pain. Negative for ear discharge, ear pain and sore throat.        PND   Respiratory:  Positive for cough, sputum production (green yellow) and shortness of breath (at night when he goes to bed). Negative for wheezing.   Gastrointestinal:  Negative for abdominal pain, nausea and vomiting.  Musculoskeletal:  Negative for joint pain and myalgias.  Neurological:  Positive for headaches.        Objective:    BP 118/72   Pulse 85   Temp 97.9 F (36.6 C)   Resp 16   Ht 5' 11.75" (1.822 m)   Wt 188 lb (85.3 kg)   SpO2 99%   BMI 25.68 kg/m    Physical Exam Vitals and nursing note reviewed.  Constitutional:      Appearance: Normal appearance.  HENT:     Right Ear: Tympanic membrane, ear canal and external ear normal.     Left Ear: Tympanic membrane, ear canal and external ear normal.     Nose:     Right Sinus: No maxillary sinus tenderness or frontal sinus tenderness.     Left Sinus: No maxillary sinus tenderness or frontal sinus tenderness.     Mouth/Throat:     Mouth: Mucous membranes are moist.     Pharynx: Oropharynx is clear.  Cardiovascular:     Rate and Rhythm: Normal rate and regular rhythm.     Heart sounds: Normal heart sounds.   Pulmonary:     Effort: Pulmonary effort is normal.     Breath sounds: Normal breath sounds.  Neurological:     Mental Status: He is alert.     No results found for any visits on 01/10/22.      Assessment & Plan:   Problem List Items Addressed This Visit       Respiratory   Upper respiratory tract infection - Primary    Given length of symptoms patient presentation and him being on immunocompromising medications will like to treat with doxycycline 100 mg twice daily for 7 days.  Patient will follow-up if no improvement.      Relevant Medications   doxycycline (VIBRA-TABS) 100 MG tablet     Other   Shortness of breath    Patient states some shortness of breath when he lies down at night.  Lungs clear to auscultation will treat with antibiotic.  Patient given albuterol inhaler to use as needed basis.  Follow-up if no improvement      Relevant Medications   albuterol (VENTOLIN HFA) 108 (90 Base) MCG/ACT inhaler    Meds ordered this encounter  Medications  albuterol (VENTOLIN HFA) 108 (90 Base) MCG/ACT inhaler    Sig: Inhale 2 puffs into the lungs every 6 (six) hours as needed for wheezing or shortness of breath.    Dispense:  8 g    Refill:  0    Order Specific Question:   Supervising Provider    Answer:   Glori Bickers, MARNE A [[id number]]   doxycycline (VIBRA-TABS) 100 MG tablet    Sig: Take 1 tablet (100 mg total) by mouth 2 (two) times daily for 7 days.    Dispense:  14 tablet    Refill:  0    Order Specific Question:   Supervising Provider    Answer:   TOWER, MARNE A [[id number]]    Return if symptoms worsen or fail to improve.  Romilda Garret, NP

## 2022-01-19 DIAGNOSIS — M48062 Spinal stenosis, lumbar region with neurogenic claudication: Secondary | ICD-10-CM | POA: Diagnosis not present

## 2022-01-19 DIAGNOSIS — M5030 Other cervical disc degeneration, unspecified cervical region: Secondary | ICD-10-CM | POA: Diagnosis not present

## 2022-01-19 DIAGNOSIS — M5412 Radiculopathy, cervical region: Secondary | ICD-10-CM | POA: Diagnosis not present

## 2022-01-19 DIAGNOSIS — M5020 Other cervical disc displacement, unspecified cervical region: Secondary | ICD-10-CM | POA: Diagnosis not present

## 2022-01-19 DIAGNOSIS — M5126 Other intervertebral disc displacement, lumbar region: Secondary | ICD-10-CM | POA: Diagnosis not present

## 2022-01-19 DIAGNOSIS — R29898 Other symptoms and signs involving the musculoskeletal system: Secondary | ICD-10-CM | POA: Diagnosis not present

## 2022-01-19 DIAGNOSIS — M5136 Other intervertebral disc degeneration, lumbar region: Secondary | ICD-10-CM | POA: Diagnosis not present

## 2022-01-19 DIAGNOSIS — M5416 Radiculopathy, lumbar region: Secondary | ICD-10-CM | POA: Diagnosis not present

## 2022-01-27 DIAGNOSIS — M79669 Pain in unspecified lower leg: Secondary | ICD-10-CM | POA: Diagnosis not present

## 2022-02-06 DIAGNOSIS — M48062 Spinal stenosis, lumbar region with neurogenic claudication: Secondary | ICD-10-CM | POA: Diagnosis not present

## 2022-02-06 DIAGNOSIS — M5416 Radiculopathy, lumbar region: Secondary | ICD-10-CM | POA: Diagnosis not present

## 2022-03-07 DIAGNOSIS — H43813 Vitreous degeneration, bilateral: Secondary | ICD-10-CM | POA: Diagnosis not present

## 2022-03-07 DIAGNOSIS — M316 Other giant cell arteritis: Secondary | ICD-10-CM | POA: Diagnosis not present

## 2022-03-07 DIAGNOSIS — H33193 Other retinoschisis and retinal cysts, bilateral: Secondary | ICD-10-CM | POA: Diagnosis not present

## 2022-03-07 DIAGNOSIS — H18513 Endothelial corneal dystrophy, bilateral: Secondary | ICD-10-CM | POA: Diagnosis not present

## 2022-03-07 DIAGNOSIS — H40013 Open angle with borderline findings, low risk, bilateral: Secondary | ICD-10-CM | POA: Diagnosis not present

## 2022-03-07 DIAGNOSIS — Z961 Presence of intraocular lens: Secondary | ICD-10-CM | POA: Diagnosis not present

## 2022-03-20 DIAGNOSIS — M79601 Pain in right arm: Secondary | ICD-10-CM | POA: Diagnosis not present

## 2022-03-20 DIAGNOSIS — M79602 Pain in left arm: Secondary | ICD-10-CM | POA: Diagnosis not present

## 2022-03-21 DIAGNOSIS — M316 Other giant cell arteritis: Secondary | ICD-10-CM | POA: Diagnosis not present

## 2022-03-21 DIAGNOSIS — Z79899 Other long term (current) drug therapy: Secondary | ICD-10-CM | POA: Diagnosis not present

## 2022-03-21 DIAGNOSIS — G959 Disease of spinal cord, unspecified: Secondary | ICD-10-CM | POA: Diagnosis not present

## 2022-03-21 DIAGNOSIS — F19982 Other psychoactive substance use, unspecified with psychoactive substance-induced sleep disorder: Secondary | ICD-10-CM | POA: Diagnosis not present

## 2022-03-26 ENCOUNTER — Ambulatory Visit (INDEPENDENT_AMBULATORY_CARE_PROVIDER_SITE_OTHER): Payer: PPO | Admitting: Family Medicine

## 2022-03-26 ENCOUNTER — Encounter: Payer: Self-pay | Admitting: Family Medicine

## 2022-03-26 VITALS — BP 124/70 | HR 92 | Temp 97.5°F | Ht 71.75 in | Wt 177.5 lb

## 2022-03-26 DIAGNOSIS — M316 Other giant cell arteritis: Secondary | ICD-10-CM | POA: Diagnosis not present

## 2022-03-26 DIAGNOSIS — U071 COVID-19: Secondary | ICD-10-CM | POA: Diagnosis not present

## 2022-03-26 DIAGNOSIS — Z20822 Contact with and (suspected) exposure to covid-19: Secondary | ICD-10-CM

## 2022-03-26 DIAGNOSIS — E1169 Type 2 diabetes mellitus with other specified complication: Secondary | ICD-10-CM | POA: Diagnosis not present

## 2022-03-26 HISTORY — DX: COVID-19: U07.1

## 2022-03-26 LAB — POCT GLYCOSYLATED HEMOGLOBIN (HGB A1C): Hemoglobin A1C: 6 % — AB (ref 4.0–5.6)

## 2022-03-26 LAB — POC COVID19 BINAXNOW: SARS Coronavirus 2 Ag: POSITIVE — AB

## 2022-03-26 MED ORDER — NIRMATRELVIR/RITONAVIR (PAXLOVID)TABLET: 3.0000 | ORAL_TABLET | Freq: Two times a day (BID) | ORAL | 0 refills | Status: AC

## 2022-03-26 NOTE — Progress Notes (Signed)
Patient ID: Jonathon Chavez, male    DOB: 12-25-1945, 77 y.o.   MRN: [id number]  This visit was conducted in person.  BP 124/70   Pulse 92   Temp (!) 97.5 F (36.4 C) (Temporal)   Ht 5' 11.75" (1.822 m)   Wt 177 lb 8 oz (80.5 kg)   SpO2 95%   BMI 24.24 kg/m    CC: 3 mo DM f/u visit - converted to acute visit.  Subjective:   HPI: Jonathon Chavez is a 77 y.o. male presenting on 03/26/2022 for Medical Management of Chronic Issues (Here for 3 mo DM f/u. Also, c/o body aches, cough, HA and runny nose. Sxs started 03/24/22. Exposed to [place] by wife, currently on Paxlovid. Pt accompanied by wife, [name]. )   COVID symptoms - started 03/24/2022. COVID positive in office today Current symptoms: body aches, HA, productive cough, rhinorrhea. Mild nausea this morning. Fatigue.  No: fevers/chills, ear or tooth pain, abd pain, diarrhea, dyspnea, loss of taste/smell.  Treatments to date: nyquil  Risk factors include: age, gender, DM, HTN  COVID vaccination status: Pfizer x2   DM - continues metformin '500mg'$  daily. Continues checking sugars daily with OneTouch meter - 110-160s postprandial. Overall good control.   Continues prednisone 7 mg daily with breakfast along with Actemra - for PMR with giant cell arteritis syndrome followed by rheumatologist Lab Results  Component Value Date   HGBA1C 6.0 (A) 03/26/2022        Relevant past medical, surgical, family and social history reviewed and updated as indicated. Interim medical history since our last visit reviewed. Allergies and medications reviewed and updated. Outpatient Medications Prior to Visit  Medication Sig Dispense Refill   ACTEMRA ACTPEN 162 MG/0.9ML SOAJ Inject into the skin.     albuterol (VENTOLIN HFA) 108 (90 Base) MCG/ACT inhaler Inhale 2 puffs into the lungs every 6 (six) hours as needed for wheezing or shortness of breath. 8 g 0   Ascorbic Acid (VITAMIN C) 1000 MG tablet Take 1,000 mg by mouth daily.     Blood Glucose  Monitoring Suppl (ONETOUCH VERIO FLEX SYSTEM) w/Device KIT Use as instructed to check blood sugar once a day 1 kit 0   dorzolamide-timolol (COSOPT) 2-0.5 % ophthalmic solution 1 drop 2 (two) times daily.     gabapentin (NEURONTIN) 100 MG capsule Take 4 capsules (400 mg total) by mouth at bedtime.     glucose blood (ONETOUCH VERIO) test strip Use as instructed to check blood sugar once a day 100 each 3   Lancets (ONETOUCH DELICA PLUS [id number]) MISC Use as instructed to check blood sugar once a day 100 each 3   losartan (COZAAR) 50 MG tablet TAKE 1 TABLET BY MOUTH EVERY DAY 90 tablet 3   metFORMIN (GLUCOPHAGE) 500 MG tablet Take 1 tablet (500 mg total) by mouth daily with breakfast. 90 tablet 1   pantoprazole (PROTONIX) 40 MG tablet Take 1 tablet by mouth daily. 90 tablet 3   predniSONE (DELTASONE) 10 MG tablet Take 15 mg by mouth daily with breakfast. Takes 7 mg daily     HYDROcodone-acetaminophen (NORCO/VICODIN) 5-325 MG tablet Take 0.5-1 tablets by mouth 3 (three) times daily as needed for moderate pain. 15 tablet 0   No facility-administered medications prior to visit.     Per HPI unless specifically indicated in ROS section below Review of Systems  Objective:  BP 124/70   Pulse 92   Temp (!) 97.5 F (36.4 C) (Temporal)  Ht 5' 11.75" (1.822 m)   Wt 177 lb 8 oz (80.5 kg)   SpO2 95%   BMI 24.24 kg/m   Wt Readings from Last 3 Encounters:  03/26/22 177 lb 8 oz (80.5 kg)  01/10/22 188 lb (85.3 kg)  01/09/22 186 lb (84.4 kg)      Physical Exam    Results for orders placed or performed in visit on 03/26/22  POCT glycosylated hemoglobin (Hb A1C)  Result Value Ref Range   Hemoglobin A1C 6.0 (A) 4.0 - 5.6 %   HbA1c POC (<> result, manual entry)     HbA1c, POC (prediabetic range)     HbA1c, POC (controlled diabetic range)    POC COVID-19 BinaxNow  Result Value Ref Range   SARS Coronavirus 2 Ag Positive (A) Negative   Lab Results  Component Value Date   CREATININE 0.99  09/13/2021   BUN 28 (H) 09/13/2021   NA 136 09/13/2021   K 4.2 09/13/2021   CL 99 09/13/2021   CO2 31 09/13/2021   GFR = 75  Assessment & Plan:   Problem List Items Addressed This Visit     Type 2 diabetes mellitus with other specified complication ([facility])    Continue metformin. A1c today showing great control - congratulated. Reassess at CPE      Relevant Orders   POCT glycosylated hemoglobin (Hb A1C) (Completed)   Giant cell arteritis syndrome (H[facility])    Appreciate rheum care - now on Actemra and undergoing prednisone taper. Has been overall feeling very well.       COVID-19 virus infection - Primary    Reviewed currently approved antiviral treatments.  Reviewed expected course of illness, anticipated course of recovery, as well as red flags to suggest COVID pneumonia and/or to seek urgent in-person care. Reviewed CDC isolation/quarantine guidelines.  Encouraged fluids and rest. Reviewed further supportive care measures at home including vit C '500mg'$  bid, vit D 2000 IU daily, zinc '100mg'$  daily, tylenol PRN, pepcid '20mg'$  BID PRN.   Recommend:  Full dose paxlovid Paxlovid drug interactions:   None Discussed reaching out to rheumatology about holding Actemra while recovering from COVID infection.       Relevant Medications   nirmatrelvir/ritonavir (PAXLOVID) 20 x 150 MG & 10 x '100MG'$  TABS   Other Visit Diagnoses     Close exposure to COVID-19 virus       Relevant Orders   POC COVID-19 BinaxNow (Completed)        Meds ordered this encounter  Medications   nirmatrelvir/ritonavir (PAXLOVID) 20 x 150 MG & 10 x '100MG'$  TABS    Sig: Take 3 tablets by mouth 2 (two) times daily for 5 days. (Take nirmatrelvir 150 mg two tablets twice daily for 5 days and ritonavir 100 mg one tablet twice daily for 5 days) Patient GFR is 75    Dispense:  30 tablet    Refill:  0    Orders Placed This Encounter  Procedures   POCT glycosylated hemoglobin (Hb A1C)   POC COVID-19 BinaxNow    Order  Specific Question:   Previously tested for COVID-19    Answer:   Yes    Order Specific Question:   Resident in a congregate (group) care setting    Answer:   No    Order Specific Question:   Employed in healthcare setting    Answer:   No    Patient Instructions  For COVID symptoms - start paxlovid sent to pharmacy Let us know, seek  care in person if worsening symptoms including fever >101 persists or shortness of breath develops.  Sugars are doing great! Continue metformin for now. Reassess at physical.  Call pharmacy to ensure Paxlovid is covered. If not, go to [employer] to sign up for discounted paxlovid through [employer] ([email or web address])  Follow up plan: Return if symptoms worsen or fail to improve.  Ria Bush, MD

## 2022-03-26 NOTE — Assessment & Plan Note (Addendum)
Reviewed currently approved antiviral treatments.  Reviewed expected course of illness, anticipated course of recovery, as well as red flags to suggest COVID pneumonia and/or to seek urgent in-person care. Reviewed CDC isolation/quarantine guidelines.  Encouraged fluids and rest. Reviewed further supportive care measures at home including vit C '500mg'$  bid, vit D 2000 IU daily, zinc '100mg'$  daily, tylenol PRN, pepcid '20mg'$  BID PRN.   Recommend:  Full dose paxlovid Paxlovid drug interactions:   None Discussed reaching out to rheumatology about holding Actemra while recovering from COVID infection.

## 2022-03-26 NOTE — Assessment & Plan Note (Addendum)
Continue metformin. A1c today showing great control - congratulated. Reassess at CPE

## 2022-03-26 NOTE — Assessment & Plan Note (Signed)
Appreciate rheum care - now on Actemra and undergoing prednisone taper. Has been overall feeling very well.

## 2022-03-26 NOTE — Patient Instructions (Addendum)
For COVID symptoms - start paxlovid sent to pharmacy Let us know, seek care in person if worsening symptoms including fever >101 persists or shortness of breath develops.  Sugars are doing great! Continue metformin for now. Reassess at physical.  Call pharmacy to ensure Paxlovid is covered. If not, go to [employer] to sign up for discounted paxlovid through [employer] ([email or web address])

## 2022-03-29 ENCOUNTER — Encounter: Payer: Self-pay | Admitting: Family Medicine

## 2022-04-11 ENCOUNTER — Encounter: Payer: Self-pay | Admitting: Family Medicine

## 2022-04-14 ENCOUNTER — Other Ambulatory Visit: Payer: Self-pay | Admitting: Family Medicine

## 2022-04-14 DIAGNOSIS — E1169 Type 2 diabetes mellitus with other specified complication: Secondary | ICD-10-CM

## 2022-05-02 DIAGNOSIS — M316 Other giant cell arteritis: Secondary | ICD-10-CM | POA: Diagnosis not present

## 2022-05-02 DIAGNOSIS — Z79899 Other long term (current) drug therapy: Secondary | ICD-10-CM | POA: Diagnosis not present

## 2022-05-02 DIAGNOSIS — G959 Disease of spinal cord, unspecified: Secondary | ICD-10-CM | POA: Diagnosis not present

## 2022-05-02 DIAGNOSIS — F19982 Other psychoactive substance use, unspecified with psychoactive substance-induced sleep disorder: Secondary | ICD-10-CM | POA: Diagnosis not present

## 2022-05-07 DIAGNOSIS — R200 Anesthesia of skin: Secondary | ICD-10-CM | POA: Diagnosis not present

## 2022-05-07 DIAGNOSIS — R202 Paresthesia of skin: Secondary | ICD-10-CM | POA: Diagnosis not present

## 2022-05-15 ENCOUNTER — Encounter (INDEPENDENT_AMBULATORY_CARE_PROVIDER_SITE_OTHER): Payer: PPO | Admitting: Ophthalmology

## 2022-06-11 DIAGNOSIS — G959 Disease of spinal cord, unspecified: Secondary | ICD-10-CM | POA: Diagnosis not present

## 2022-06-11 DIAGNOSIS — Z7952 Long term (current) use of systemic steroids: Secondary | ICD-10-CM | POA: Diagnosis not present

## 2022-06-11 DIAGNOSIS — Z79899 Other long term (current) drug therapy: Secondary | ICD-10-CM | POA: Diagnosis not present

## 2022-06-11 DIAGNOSIS — M316 Other giant cell arteritis: Secondary | ICD-10-CM | POA: Diagnosis not present

## 2022-06-21 DIAGNOSIS — M8588 Other specified disorders of bone density and structure, other site: Secondary | ICD-10-CM | POA: Diagnosis not present

## 2022-07-11 ENCOUNTER — Other Ambulatory Visit: Payer: Self-pay | Admitting: Family Medicine

## 2022-07-11 DIAGNOSIS — E1169 Type 2 diabetes mellitus with other specified complication: Secondary | ICD-10-CM

## 2022-07-13 DIAGNOSIS — M316 Other giant cell arteritis: Secondary | ICD-10-CM | POA: Diagnosis not present

## 2022-07-13 DIAGNOSIS — H40013 Open angle with borderline findings, low risk, bilateral: Secondary | ICD-10-CM | POA: Diagnosis not present

## 2022-07-13 DIAGNOSIS — Z961 Presence of intraocular lens: Secondary | ICD-10-CM | POA: Diagnosis not present

## 2022-07-13 DIAGNOSIS — H33193 Other retinoschisis and retinal cysts, bilateral: Secondary | ICD-10-CM | POA: Diagnosis not present

## 2022-07-13 DIAGNOSIS — H18513 Endothelial corneal dystrophy, bilateral: Secondary | ICD-10-CM | POA: Diagnosis not present

## 2022-07-13 DIAGNOSIS — H43813 Vitreous degeneration, bilateral: Secondary | ICD-10-CM | POA: Diagnosis not present

## 2022-07-24 ENCOUNTER — Encounter: Payer: Self-pay | Admitting: Family Medicine

## 2022-07-24 DIAGNOSIS — M25511 Pain in right shoulder: Secondary | ICD-10-CM

## 2022-07-25 NOTE — Telephone Encounter (Signed)
Name of Medication: Hydrocodone-APAP Name of Pharmacy: [facility] Last Fill or Written Date and Quantity: #15 Last Office Visit and Type:  03/26/22, 3 mo DM f/u; Covid+ Next Office Visit and Type:  09/24/22, CPE Last Controlled Substance Agreement Date: none Last UDS: none

## 2022-07-29 MED ORDER — HYDROCODONE-ACETAMINOPHEN 5-325 MG PO TABS: 0.5000 | ORAL_TABLET | Freq: Three times a day (TID) | ORAL | 0 refills | Status: DC | PRN

## 2022-08-06 DIAGNOSIS — M316 Other giant cell arteritis: Secondary | ICD-10-CM | POA: Diagnosis not present

## 2022-08-06 DIAGNOSIS — Z79899 Other long term (current) drug therapy: Secondary | ICD-10-CM | POA: Diagnosis not present

## 2022-08-06 DIAGNOSIS — G959 Disease of spinal cord, unspecified: Secondary | ICD-10-CM | POA: Diagnosis not present

## 2022-08-06 DIAGNOSIS — M5416 Radiculopathy, lumbar region: Secondary | ICD-10-CM | POA: Diagnosis not present

## 2022-08-10 DIAGNOSIS — M5412 Radiculopathy, cervical region: Secondary | ICD-10-CM | POA: Diagnosis not present

## 2022-08-10 DIAGNOSIS — M5126 Other intervertebral disc displacement, lumbar region: Secondary | ICD-10-CM | POA: Diagnosis not present

## 2022-08-10 DIAGNOSIS — G8929 Other chronic pain: Secondary | ICD-10-CM | POA: Diagnosis not present

## 2022-08-10 DIAGNOSIS — M25511 Pain in right shoulder: Secondary | ICD-10-CM | POA: Diagnosis not present

## 2022-08-10 DIAGNOSIS — M5030 Other cervical disc degeneration, unspecified cervical region: Secondary | ICD-10-CM | POA: Diagnosis not present

## 2022-08-18 ENCOUNTER — Other Ambulatory Visit: Payer: Self-pay | Admitting: Family Medicine

## 2022-08-18 DIAGNOSIS — E1169 Type 2 diabetes mellitus with other specified complication: Secondary | ICD-10-CM

## 2022-08-22 DIAGNOSIS — Z961 Presence of intraocular lens: Secondary | ICD-10-CM | POA: Diagnosis not present

## 2022-08-22 DIAGNOSIS — H18513 Endothelial corneal dystrophy, bilateral: Secondary | ICD-10-CM | POA: Diagnosis not present

## 2022-08-22 DIAGNOSIS — M316 Other giant cell arteritis: Secondary | ICD-10-CM | POA: Diagnosis not present

## 2022-08-22 DIAGNOSIS — H43813 Vitreous degeneration, bilateral: Secondary | ICD-10-CM | POA: Diagnosis not present

## 2022-08-22 DIAGNOSIS — H40013 Open angle with borderline findings, low risk, bilateral: Secondary | ICD-10-CM | POA: Diagnosis not present

## 2022-08-22 DIAGNOSIS — H33193 Other retinoschisis and retinal cysts, bilateral: Secondary | ICD-10-CM | POA: Diagnosis not present

## 2022-09-03 DIAGNOSIS — M545 Low back pain, unspecified: Secondary | ICD-10-CM | POA: Diagnosis not present

## 2022-09-03 DIAGNOSIS — M542 Cervicalgia: Secondary | ICD-10-CM | POA: Diagnosis not present

## 2022-09-03 DIAGNOSIS — M25519 Pain in unspecified shoulder: Secondary | ICD-10-CM | POA: Diagnosis not present

## 2022-09-06 DIAGNOSIS — R634 Abnormal weight loss: Secondary | ICD-10-CM | POA: Diagnosis not present

## 2022-09-06 DIAGNOSIS — M316 Other giant cell arteritis: Secondary | ICD-10-CM | POA: Diagnosis not present

## 2022-09-10 DIAGNOSIS — M545 Low back pain, unspecified: Secondary | ICD-10-CM | POA: Diagnosis not present

## 2022-09-10 DIAGNOSIS — M25519 Pain in unspecified shoulder: Secondary | ICD-10-CM | POA: Diagnosis not present

## 2022-09-10 DIAGNOSIS — M542 Cervicalgia: Secondary | ICD-10-CM | POA: Diagnosis not present

## 2022-09-13 ENCOUNTER — Other Ambulatory Visit: Payer: Self-pay | Admitting: Family Medicine

## 2022-09-13 DIAGNOSIS — R3129 Other microscopic hematuria: Secondary | ICD-10-CM

## 2022-09-13 DIAGNOSIS — Z7739 Contact with and (suspected) exposure to other war theater: Secondary | ICD-10-CM

## 2022-09-13 DIAGNOSIS — Z77098 Contact with and (suspected) exposure to other hazardous, chiefly nonmedicinal, chemicals: Secondary | ICD-10-CM

## 2022-09-13 DIAGNOSIS — E1169 Type 2 diabetes mellitus with other specified complication: Secondary | ICD-10-CM

## 2022-09-13 DIAGNOSIS — M316 Other giant cell arteritis: Secondary | ICD-10-CM

## 2022-09-13 DIAGNOSIS — N401 Enlarged prostate with lower urinary tract symptoms: Secondary | ICD-10-CM

## 2022-09-17 ENCOUNTER — Other Ambulatory Visit (INDEPENDENT_AMBULATORY_CARE_PROVIDER_SITE_OTHER): Payer: PPO

## 2022-09-17 DIAGNOSIS — M316 Other giant cell arteritis: Secondary | ICD-10-CM

## 2022-09-17 DIAGNOSIS — Z7985 Long-term (current) use of injectable non-insulin antidiabetic drugs: Secondary | ICD-10-CM

## 2022-09-17 DIAGNOSIS — E785 Hyperlipidemia, unspecified: Secondary | ICD-10-CM | POA: Diagnosis not present

## 2022-09-17 DIAGNOSIS — Z77098 Contact with and (suspected) exposure to other hazardous, chiefly nonmedicinal, chemicals: Secondary | ICD-10-CM | POA: Diagnosis not present

## 2022-09-17 DIAGNOSIS — R3129 Other microscopic hematuria: Secondary | ICD-10-CM | POA: Diagnosis not present

## 2022-09-17 DIAGNOSIS — R351 Nocturia: Secondary | ICD-10-CM

## 2022-09-17 DIAGNOSIS — N401 Enlarged prostate with lower urinary tract symptoms: Secondary | ICD-10-CM

## 2022-09-17 DIAGNOSIS — M545 Low back pain, unspecified: Secondary | ICD-10-CM | POA: Diagnosis not present

## 2022-09-17 DIAGNOSIS — E1169 Type 2 diabetes mellitus with other specified complication: Secondary | ICD-10-CM | POA: Diagnosis not present

## 2022-09-17 DIAGNOSIS — M542 Cervicalgia: Secondary | ICD-10-CM | POA: Diagnosis not present

## 2022-09-17 DIAGNOSIS — Z7739 Contact with and (suspected) exposure to other war theater: Secondary | ICD-10-CM

## 2022-09-17 DIAGNOSIS — M25519 Pain in unspecified shoulder: Secondary | ICD-10-CM | POA: Diagnosis not present

## 2022-09-17 LAB — CBC WITH DIFFERENTIAL/PLATELET
Basophils Absolute: 0 10*3/uL (ref 0.0–0.1)
Basophils Relative: 0.6 % (ref 0.0–3.0)
Eosinophils Absolute: 0.1 10*3/uL (ref 0.0–0.7)
Eosinophils Relative: 3.4 % (ref 0.0–5.0)
HCT: 39.4 % (ref 39.0–52.0)
Hemoglobin: 13.4 g/dL (ref 13.0–17.0)
Lymphocytes Relative: 26.6 % (ref 12.0–46.0)
Lymphs Abs: 1.1 10*3/uL (ref 0.7–4.0)
MCHC: 33.9 g/dL (ref 30.0–36.0)
MCV: 88.3 fl (ref 78.0–100.0)
Monocytes Absolute: 0.5 10*3/uL (ref 0.1–1.0)
Monocytes Relative: 11.9 % (ref 3.0–12.0)
Neutro Abs: 2.4 10*3/uL (ref 1.4–7.7)
Neutrophils Relative %: 57.5 % (ref 43.0–77.0)
Platelets: 132 10*3/uL — ABNORMAL LOW (ref 150.0–400.0)
RBC: 4.46 Mil/uL (ref 4.22–5.81)
RDW: 12.8 % (ref 11.5–15.5)
WBC: 4.1 10*3/uL (ref 4.0–10.5)

## 2022-09-17 LAB — COMPREHENSIVE METABOLIC PANEL
ALT: 15 U/L (ref 0–53)
AST: 16 U/L (ref 0–37)
Albumin: 4 g/dL (ref 3.5–5.2)
Alkaline Phosphatase: 38 U/L — ABNORMAL LOW (ref 39–117)
BUN: 22 mg/dL (ref 6–23)
CO2: 29 mEq/L (ref 19–32)
Calcium: 9 mg/dL (ref 8.4–10.5)
Chloride: 106 mEq/L (ref 96–112)
Creatinine, Ser: 0.86 mg/dL (ref 0.40–1.50)
GFR: 84.74 mL/min (ref >60.00)
Glucose, Bld: 116 mg/dL — ABNORMAL HIGH (ref 70–99)
Potassium: 3.9 mEq/L (ref 3.5–5.1)
Sodium: 142 mEq/L (ref 135–145)
Total Bilirubin: 0.8 mg/dL (ref 0.2–1.2)
Total Protein: 6.1 g/dL (ref 6.0–8.3)

## 2022-09-17 LAB — SEDIMENTATION RATE: Sed Rate: 1 mm/hr (ref 0–20)

## 2022-09-17 LAB — LIPID PANEL
Cholesterol: 175 mg/dL (ref 0–200)
HDL: 38.7 mg/dL — ABNORMAL LOW (ref >39.00)
LDL Cholesterol: 119 mg/dL — ABNORMAL HIGH (ref 0–99)
NonHDL: 136.46
Total CHOL/HDL Ratio: 5
Triglycerides: 86 mg/dL (ref 0.0–149.0)
VLDL: 17.2 mg/dL (ref 0.0–40.0)

## 2022-09-17 LAB — URINALYSIS, ROUTINE W REFLEX MICROSCOPIC
Bilirubin Urine: NEGATIVE
Hgb urine dipstick: NEGATIVE
Ketones, ur: NEGATIVE
Leukocytes,Ua: NEGATIVE
Nitrite: NEGATIVE
RBC / HPF: NONE SEEN (ref 0–?)
Specific Gravity, Urine: >=1.03 — AB (ref 1.000–1.030)
Total Protein, Urine: NEGATIVE
Urine Glucose: NEGATIVE
Urobilinogen, UA: 0.2 (ref 0.0–1.0)
pH: 6 (ref 5.0–8.0)

## 2022-09-17 LAB — MICROALBUMIN / CREATININE URINE RATIO
Creatinine,U: 186.6 mg/dL
Microalb Creat Ratio: 0.4 mg/g (ref 0.0–30.0)
Microalb, Ur: 0.8 mg/dL (ref 0.0–1.9)

## 2022-09-17 LAB — HEMOGLOBIN A1C: Hgb A1c MFr Bld: 5.8 % (ref 4.6–6.5)

## 2022-09-17 LAB — PSA: PSA: 2.36 ng/mL (ref 0.10–4.00)

## 2022-09-18 ENCOUNTER — Ambulatory Visit (INDEPENDENT_AMBULATORY_CARE_PROVIDER_SITE_OTHER): Payer: PPO

## 2022-09-18 VITALS — BP 132/74 | HR 71 | Ht 72.0 in | Wt 168.0 lb

## 2022-09-18 DIAGNOSIS — Z23 Encounter for immunization: Secondary | ICD-10-CM

## 2022-09-18 DIAGNOSIS — Z0000 Encounter for general adult medical examination without abnormal findings: Secondary | ICD-10-CM | POA: Diagnosis not present

## 2022-09-18 NOTE — Patient Instructions (Signed)
[name] , Thank you for taking time to come for your Medicare Wellness Visit. I appreciate your ongoing commitment to your health goals. Please review the following plan we discussed and let me know if I can assist you in the future.   Referrals/Orders/Follow-Ups/Clinician Recommendations: none  This is a list of the screening recommended for you and due dates:  Health Maintenance  Topic Date Due   Zoster (Shingles) Vaccine (1 of 2) 04/09/1966   COVID-19 Vaccine (3 - Pfizer risk series) 10/20/2019   Flu Shot  09/20/2022   Eye exam for diabetics  09/26/2022   Complete foot exam   12/23/2022   Medicare Annual Wellness Visit  01/10/2023   DTaP/Tdap/Td vaccine (3 - Td or Tdap) 02/07/2023   Hemoglobin A1C  03/20/2023   Yearly kidney function blood test for diabetes  09/17/2023   Yearly kidney health urinalysis for diabetes  09/17/2023   Colon Cancer Screening  10/19/2024   Pneumonia Vaccine  Completed   Hepatitis C Screening  Completed   HPV Vaccine  Aged Out    Advanced directives: (Copy Requested) Please bring a copy of your health care power of attorney and living will to the office to be added to your chart at your convenience.  Next Medicare Annual Wellness Visit scheduled for next year: Yes  Preventive Care 26 Years and Older, Male  Preventive care refers to lifestyle choices and visits with your health care provider that can promote health and wellness. What does preventive care include? A yearly physical exam. This is also called an annual well check. Dental exams once or twice a year. Routine eye exams. Ask your health care provider how often you should have your eyes checked. Personal lifestyle choices, including: Daily care of your teeth and gums. Regular physical activity. Eating a healthy diet. Avoiding tobacco and drug use. Limiting alcohol use. Practicing safe sex. Taking low doses of aspirin every day. Taking vitamin and mineral supplements as recommended by  your health care provider. What happens during an annual well check? The services and screenings done by your health care provider during your annual well check will depend on your age, overall health, lifestyle risk factors, and family history of disease. Counseling  Your health care provider may ask you questions about your: Alcohol use. Tobacco use. Drug use. Emotional well-being. Home and relationship well-being. Sexual activity. Eating habits. History of falls. Memory and ability to understand (cognition). Work and work Astronomer. Screening  You may have the following tests or measurements: Height, weight, and BMI. Blood pressure. Lipid and cholesterol levels. These may be checked every 5 years, or more frequently if you are over 106 years old. Skin check. Lung cancer screening. You may have this screening every year starting at age 55 if you have a 30-pack-year history of smoking and currently smoke or have quit within the past 15 years. Fecal occult blood test (FOBT) of the stool. You may have this test every year starting at age 10. Flexible sigmoidoscopy or colonoscopy. You may have a sigmoidoscopy every 5 years or a colonoscopy every 10 years starting at age 25. Prostate cancer screening. Recommendations will vary depending on your family history and other risks. Hepatitis C blood test. Hepatitis B blood test. Sexually transmitted disease (STD) testing. Diabetes screening. This is done by checking your blood sugar (glucose) after you have not eaten for a while (fasting). You may have this done every 1-3 years. Abdominal aortic aneurysm (AAA) screening. You may need this if you  are a current or former smoker. Osteoporosis. You may be screened starting at age 53 if you are at high risk. Talk with your health care provider about your test results, treatment options, and if necessary, the need for more tests. Vaccines  Your health care provider may recommend certain vaccines,  such as: Influenza vaccine. This is recommended every year. Tetanus, diphtheria, and acellular pertussis (Tdap, Td) vaccine. You may need a Td booster every 10 years. Zoster vaccine. You may need this after age 84. Pneumococcal 13-valent conjugate (PCV13) vaccine. One dose is recommended after age 43. Pneumococcal polysaccharide (PPSV23) vaccine. One dose is recommended after age 43. Talk to your health care provider about which screenings and vaccines you need and how often you need them. This information is not intended to replace advice given to you by your health care provider. Make sure you discuss any questions you have with your health care provider. Document Released: 03/04/2015 Document Revised: 10/26/2015 Document Reviewed: 12/07/2014 Elsevier Interactive Patient Education  2017 [employer].  Fall Prevention in the Home Falls can cause injuries. They can happen to people of all ages. There are many things you can do to make your home safe and to help prevent falls. What can I do on the outside of my home? Regularly fix the edges of walkways and driveways and fix any cracks. Remove anything that might make you trip as you walk through a door, such as a raised step or threshold. Trim any bushes or trees on the path to your home. Use bright outdoor lighting. Clear any walking paths of anything that might make someone trip, such as rocks or tools. Regularly check to see if handrails are loose or broken. Make sure that both sides of any steps have handrails. Any raised decks and porches should have guardrails on the edges. Have any leaves, snow, or ice cleared regularly. Use sand or salt on walking paths during winter. Clean up any spills in your garage right away. This includes oil or grease spills. What can I do in the bathroom? Use night lights. Install grab bars by the toilet and in the tub and shower. Do not use towel bars as grab bars. Use non-skid mats or decals in the tub or  shower. If you need to sit down in the shower, use a plastic, non-slip stool. Keep the floor dry. Clean up any water that spills on the floor as soon as it happens. Remove soap buildup in the tub or shower regularly. Attach bath mats securely with double-sided non-slip rug tape. Do not have throw rugs and other things on the floor that can make you trip. What can I do in the bedroom? Use night lights. Make sure that you have a light by your bed that is easy to reach. Do not use any sheets or blankets that are too big for your bed. They should not hang down onto the floor. Have a firm chair that has side arms. You can use this for support while you get dressed. Do not have throw rugs and other things on the floor that can make you trip. What can I do in the kitchen? Clean up any spills right away. Avoid walking on wet floors. Keep items that you use a lot in easy-to-reach places. If you need to reach something above you, use a strong step stool that has a grab bar. Keep electrical cords out of the way. Do not use floor polish or wax that makes floors slippery. If you  must use wax, use non-skid floor wax. Do not have throw rugs and other things on the floor that can make you trip. What can I do with my stairs? Do not leave any items on the stairs. Make sure that there are handrails on both sides of the stairs and use them. Fix handrails that are broken or loose. Make sure that handrails are as long as the stairways. Check any carpeting to make sure that it is firmly attached to the stairs. Fix any carpet that is loose or worn. Avoid having throw rugs at the top or bottom of the stairs. If you do have throw rugs, attach them to the floor with carpet tape. Make sure that you have a light switch at the top of the stairs and the bottom of the stairs. If you do not have them, ask someone to add them for you. What else can I do to help prevent falls? Wear shoes that: Do not have high heels. Have  rubber bottoms. Are comfortable and fit you well. Are closed at the toe. Do not wear sandals. If you use a stepladder: Make sure that it is fully opened. Do not climb a closed stepladder. Make sure that both sides of the stepladder are locked into place. Ask someone to hold it for you, if possible. Clearly mark and make sure that you can see: Any grab bars or handrails. First and last steps. Where the edge of each step is. Use tools that help you move around (mobility aids) if they are needed. These include: Canes. Walkers. Scooters. Crutches. Turn on the lights when you go into a dark area. Replace any light bulbs as soon as they burn out. Set up your furniture so you have a clear path. Avoid moving your furniture around. If any of your floors are uneven, fix them. If there are any pets around you, be aware of where they are. Review your medicines with your doctor. Some medicines can make you feel dizzy. This can increase your chance of falling. Ask your doctor what other things that you can do to help prevent falls. This information is not intended to replace advice given to you by your health care provider. Make sure you discuss any questions you have with your health care provider. Document Released: 12/02/2008 Document Revised: 07/14/2015 Document Reviewed: 03/12/2014 Elsevier Interactive Patient Education  2017 [employer].

## 2022-09-18 NOTE — Progress Notes (Signed)
Subjective:   Jonathon Chavez is a 77 y.o. male who presents for Medicare Annual/Subsequent preventive examination.  Visit Complete: Virtual  I connected with  Jonathon Chavez on 09/18/22 by a audio enabled telemedicine application and verified that I am speaking with the correct person using two identifiers.  Patient Location: Home  Provider Location: Home Office  I discussed the limitations of evaluation and management by telemedicine. The patient expressed understanding and agreed to proceed.  Pt reported vitals.  Review of Systems      Cardiac Risk Factors include: advanced age (>68men, >35 women);hypertension;diabetes mellitus;dyslipidemia;male gender     Objective:    Today's Vitals   09/18/22 1046  BP: 132/74  Pulse: 71  SpO2: 100%  Weight: 168 lb (76.2 kg)  Height: 6' (1.829 m)   Body mass index is 22.78 kg/m.     09/18/2022   11:00 AM 01/09/2022   11:01 AM 09/18/2021   12:41 PM 07/11/2021    1:31 PM 01/04/2021   10:42 AM 01/01/2020   10:35 AM 08/29/2017   12:20 PM  Advanced Directives  Does Patient Have a Medical Advance Directive? Yes No Yes No Yes Yes No  Type of Estate agent of [place];Living will    Healthcare Power of [place];Living will Healthcare Power of [place];Living will   Does patient want to make changes to medical advance directive?   No - Patient declined  Yes (MAU/Ambulatory/Procedural Areas - Information given)    Copy of Healthcare Power of Attorney in Chart? No - copy requested     No - copy requested   Would patient like information on creating a medical advance directive?  No - Patient declined  No - Patient declined   No - Patient declined    Current Medications (verified) Outpatient Encounter Medications as of 09/18/2022  Medication Sig   ACTEMRA ACTPEN 162 MG/0.9ML SOAJ Inject into the skin.   albuterol (VENTOLIN HFA) 108 (90 Base) MCG/ACT inhaler Inhale 2 puffs into the lungs every 6 (six) hours as needed  for wheezing or shortness of breath.   Ascorbic Acid (VITAMIN C) 1000 MG tablet Take 1,000 mg by mouth daily.   Blood Glucose Monitoring Suppl (ONETOUCH VERIO FLEX SYSTEM) w/Device KIT Use as instructed to check blood sugar once a day   calcium carbonate (OSCAL) 1500 (600 Ca) MG TABS tablet Take by mouth 2 (two) times daily with a meal.   Lancets (ONETOUCH DELICA PLUS LANCET33G) MISC Use as instructed to check blood sugar once a day   losartan (COZAAR) 50 MG tablet TAKE 1 TABLET BY MOUTH EVERY DAY   metFORMIN (GLUCOPHAGE) 500 MG tablet TAKE 1 TABLET BY MOUTH EVERY DAY WITH BREAKFAST   ONETOUCH VERIO test strip USE AS INSTRUCTED TO CHECK BLOOD SUGAR ONCE A DAY   pantoprazole (PROTONIX) 40 MG tablet Take 1 tablet by mouth daily.   dorzolamide-timolol (COSOPT) 2-0.5 % ophthalmic solution 1 drop 2 (two) times daily. (Patient not taking: Reported on 09/18/2022)   gabapentin (NEURONTIN) 100 MG capsule Take 4 capsules (400 mg total) by mouth at bedtime. (Patient not taking: Reported on 09/18/2022)   HYDROcodone-acetaminophen (NORCO/VICODIN) 5-325 MG tablet Take 0.5-1 tablets by mouth 3 (three) times daily as needed for moderate pain. (Patient not taking: Reported on 09/18/2022)   predniSONE (DELTASONE) 10 MG tablet Take 15 mg by mouth daily with breakfast. Takes 7 mg daily (Patient not taking: Reported on 09/18/2022)   No facility-administered encounter medications on file as of 09/18/2022.  Allergies (verified) Lisinopril   History: Past Medical History:  Diagnosis Date   BPH (benign prostatic hypertrophy)    GERD (gastroesophageal reflux disease)    Hemorrhoids    Hypertension    Past Surgical History:  Procedure Laterality Date   ARTERY BIOPSY N/A 09/18/2021   Procedure: BIOPSY TEMPORAL ARTERY;  Surgeon: Carolan Shiver, MD;  Location: [facility] ORS;  Service: General;  Laterality: N/A;   CATARACT EXTRACTION Bilateral 2018   Dr Ernesto Rutherford   CHOLECYSTECTOMY  8/14   Dr Michela Pitcher   COLONOSCOPY   03/2013   sessile serrated polyp, diverticulosis, rpt 5 yrs ([facility])   COLONOSCOPY  10/20/2019   TA, SSP, severe diverticulosis, grade IV hem ([facility])   UPPER GASTROINTESTINAL ENDOSCOPY  10/20/2019   Barrett's, 2cm HH ([facility])   Family History  Problem Relation Age of Onset   Diabetes Maternal Grandmother    Dementia Father        alz   CAD Father 62       5v CABG   AAA (abdominal aortic aneurysm) Father    CVA Father 40   Heart disease Father    Cancer Mother        ?ovarian cancer   Cancer Brother        esophageal cancer   Esophageal cancer Brother    Cancer Brother 83       prostate cancer   Colon cancer Neg Hx    Colon polyps Neg Hx    Stomach cancer Neg Hx    Rectal cancer Neg Hx    Social History   Socioeconomic History   Marital status: Married    Spouse name: Not on file   Number of children: 1   Years of education: Not on file   Highest education level: Not on file  Occupational History   Occupation: truck Air traffic controller: [employer]  Tobacco Use   Smoking status: Former    Current packs/day: 0.00    Types: Cigarettes    Quit date: 03/25/1973    Years since quitting: 49.5   Smokeless tobacco: Never  Vaping Use   Vaping status: Never Used  Substance and Sexual Activity   Alcohol use: No   Drug use: No   Sexual activity: Not on file  Other Topics Concern   Not on file  Social History Narrative   Lives with wife.    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 - did have agent orange exposure   Activity: walks dog, physical work   Diet: good water, fruits/vegetables daily   Social Determinants of Corporate investment banker Strain: Low Risk  (09/18/2022)   Overall Financial Resource Strain (CARDIA)    Difficulty of Paying Living Expenses: Not hard at all  Food Insecurity: No Food Insecurity (09/18/2022)   Hunger Vital Sign    Worried About Running Out of Food in the Last Year: Never true     Ran Out of Food in the Last Year: Never true  Transportation Needs: No Transportation Needs (09/18/2022)   PRAPARE - Administrator, Civil Service (Medical): No    Lack of Transportation (Non-Medical): No  Physical Activity: Sufficiently Active (09/18/2022)   Exercise Vital Sign    Days of Exercise per Week: 7 days    Minutes of Exercise per Session: 30 min  Stress: No Stress Concern Present (09/18/2022)   [employer] of Occupational Health -  Occupational Stress Questionnaire    Feeling of Stress : Not at all  Social Connections: Socially Integrated (09/18/2022)   Social Connection and Isolation Panel [NHANES]    Frequency of Communication with Friends and Family: More than three times a week    Frequency of Social Gatherings with Friends and Family: More than three times a week    Attends Religious Services: More than 4 times per year    Active Member of [employer] or Organizations: Yes    Attends Engineer, structural: More than 4 times per year    Marital Status: Married    Tobacco Counseling Counseling given: Not Answered   Clinical Intake:  Pre-visit preparation completed: Yes  Pain : No/denies pain     BMI - recorded: 22.78 Nutritional Status: BMI of 19-24  Normal Nutritional Risks: None Diabetes: Yes CBG done?: No Did pt. bring in CBG monitor from home?: No  How often do you need to have someone help you when you read instructions, pamphlets, or other written materials from your doctor or pharmacy?: 1 - Never  Interpreter Needed?: No  Information entered by :: C. LPN   Activities of Daily Living    09/18/2022   11:00 AM 01/09/2022   11:02 AM  In your present state of health, do you have any difficulty performing the following activities:  Hearing? 0 0  Vision? 0 0  Difficulty concentrating or making decisions? 0 0  Walking or climbing stairs? 0 0  Dressing or bathing? 0 0  Doing errands, shopping? 0 0  Preparing Food and eating  ? N N  Using the Toilet? N N  In the past six months, have you accidently leaked urine? N N  Do you have problems with loss of bowel control? N N  Managing your Medications? N N  Managing your Finances? N N  Housekeeping or managing your Housekeeping? N N    Patient Care Team: Eustaquio Boyden, MD as PCP - General (Family Medicine)  Indicate any recent Medical Services you may have received from other than [facility] providers in the past year (date may be approximate).     Assessment:   This is a routine wellness examination for [place].  Hearing/Vision screen Hearing Screening - Comments:: Denies hearing difficulties   Vision Screening - Comments:: Readers- UTD on eye exams - [facility]  Dietary issues and exercise activities discussed:     Goals Addressed             This Visit's Progress    Patient Stated       Get shoulder fixed so I can throw a ball again.       Depression Screen    09/18/2022   10:58 AM 03/26/2022   11:27 AM 01/09/2022   11:00 AM 01/04/2021   10:45 AM 01/01/2020   10:37 AM 12/25/2018   11:36 AM 08/29/2017   12:19 PM  PHQ 2/9 Scores  PHQ - 2 Score 0 0 0 0 0 0 0  PHQ- 9 Score  4 0  0  0    Fall Risk    09/18/2022   10:51 AM 03/26/2022   11:27 AM 01/09/2022   11:02 AM 01/04/2021   10:44 AM 01/01/2020   10:36 AM  Fall Risk   Falls in the past year? 0 0 0 0 0  Number falls in past yr: 0  0 0 0  Injury with Fall? 0  0 0 0  Risk for fall due to :  No Fall Risks  No Fall Risks No Fall Risks Medication side effect  Follow up Falls prevention discussed;Falls evaluation completed;Education provided  Falls prevention discussed;Falls evaluation completed Falls prevention discussed Falls evaluation completed;Falls prevention discussed    MEDICARE RISK AT HOME:  Medicare Risk at Home - 09/18/22 1101     Any stairs in or around the home? Yes    If so, are there any without handrails? No    Home free of loose throw rugs in walkways, pet beds,  electrical cords, etc? Yes    Adequate lighting in your home to reduce risk of falls? Yes    Life alert? No    Use of a cane, walker or w/c? No    Grab bars in the bathroom? No    Shower chair or bench in shower? No    Elevated toilet seat or a handicapped toilet? No             TIMED UP AND GO:  Was the test performed?  No    Cognitive Function:    01/01/2020   10:38 AM 08/29/2017   12:19 PM  MMSE - Mini Mental State Exam  Orientation to time 5 5  Orientation to Place 5 5  Registration 3 3  Attention/ Calculation 5 0  Recall 3 3  Language- name 2 objects  0  Language- repeat 1 1  Language- follow 3 step command  3  Language- read & follow direction  0  Write a sentence  0  Copy design  0  Total score  20        09/18/2022   11:02 AM 01/09/2022   11:02 AM  6CIT Screen  What Year? 0 points 0 points  What month? 0 points 0 points  What time? 0 points 0 points  Count back from 20 0 points 0 points  Months in reverse 0 points 0 points  Repeat phrase 0 points 0 points  Total Score 0 points 0 points    Immunizations Immunization History  Administered Date(s) Administered   Fluad Quad(high Dose 65+) 12/25/2018   Influenza Split 12/24/2012   Influenza-Unspecified 11/30/2013, 11/08/2014, 12/05/2015, 11/22/2016   PFIZER(Purple Top)SARS-COV-2 Vaccination 07/27/2019, 09/22/2019   Pneumococcal Conjugate-13 02/08/2014   Pneumococcal Polysaccharide-23 02/06/2013   Td 02/20/2003   Tdap 02/06/2013   Zoster, Live 08/28/2010    TDAP status: Up to date  Flu Vaccine status: Up to date  Pneumococcal vaccine status: Up to date  Covid-19 vaccine status: Declined, Education has been provided regarding the importance of this vaccine but patient still declined. Advised may receive this vaccine at local pharmacy or Health Dept.or vaccine clinic. Aware to provide a copy of the vaccination record if obtained from local pharmacy or Health Dept. Verbalized acceptance and  understanding.  Qualifies for Shingles Vaccine? Yes   Zostavax completed No   Shingrix Completed?: No.    Education has been provided regarding the importance of this vaccine. Patient has been advised to call insurance company to determine out of pocket expense if they have not yet received this vaccine. Advised may also receive vaccine at local pharmacy or Health Dept. Verbalized acceptance and understanding.  Screening Tests Health Maintenance  Topic Date Due   Zoster Vaccines- Shingrix (1 of 2) 04/09/1964   COVID-19 Vaccine (3 - Pfizer risk series) 10/20/2019   INFLUENZA VACCINE  09/20/2022   OPHTHALMOLOGY EXAM  09/26/2022   FOOT EXAM  12/23/2022   Medicare Annual Wellness (AWV)  01/10/2023   DTaP/Tdap/Td (  3 - Td or Tdap) 02/07/2023   HEMOGLOBIN A1C  03/20/2023   Diabetic kidney evaluation - eGFR measurement  09/17/2023   Diabetic kidney evaluation - Urine ACR  09/17/2023   Colonoscopy  10/19/2024   Pneumonia Vaccine 77+ Years old  Completed   Hepatitis C Screening  Completed   HPV VACCINES  Aged Out    Health Maintenance  Health Maintenance Due  Topic Date Due   Zoster Vaccines- Shingrix (1 of 2) 04/09/1964   COVID-19 Vaccine (3 - Pfizer risk series) 10/20/2019    Colorectal cancer screening: Type of screening: Colonoscopy. Completed 10/20/19. Repeat every 5 years  Lung Cancer Screening: (Low Dose CT Chest recommended if Age 52-80 years, 20 pack-year currently smoking OR have quit w/in 15years.) does not qualify.   Lung Cancer Screening Referral: n/a  Additional Screening:  Hepatitis C Screening: does qualify; Completed 08/29/17  Vision Screening: Recommended annual ophthalmology exams for early detection of glaucoma and other disorders of the eye. Is the patient up to date with their annual eye exam?  Yes  Who is the provider or what is the name of the office in which the patient attends annual eye exams? Dr[facility] If pt is not established with a provider, would they  like to be referred to a provider to establish care? Yes .   Dental Screening: Recommended annual dental exams for proper oral hygiene  Diabetic Foot Exam: Diabetic Foot Exam: Completed 12/23/22  Community Resource Referral / Chronic Care Management: CRR required this visit?  No   CCM required this visit?  No     Plan:     I have personally reviewed and noted the following in the patient's chart:   Medical and social history Use of alcohol, tobacco or illicit drugs  Current medications and supplements including opioid prescriptions. Patient is not currently taking opioid prescriptions. Functional ability and status Nutritional status Physical activity Advanced directives List of other physicians Hospitalizations, surgeries, and ER visits in previous 12 months Vitals Screenings to include cognitive, depression, and falls Referrals and appointments  In addition, I have reviewed and discussed with patient certain preventive protocols, quality metrics, and best practice recommendations. A written personalized care plan for preventive services as well as general preventive health recommendations were provided to patient.     Maryan Puls, LPN   [id number]   After Visit Summary: (MyChart) Due to this being a telephonic visit, the after visit summary with patients personalized plan was offered to patient via MyChart   Nurse Notes: None

## 2022-09-24 ENCOUNTER — Encounter: Payer: Self-pay | Admitting: Family Medicine

## 2022-09-24 ENCOUNTER — Ambulatory Visit (INDEPENDENT_AMBULATORY_CARE_PROVIDER_SITE_OTHER): Payer: PPO | Admitting: Family Medicine

## 2022-09-24 VITALS — BP 124/64 | HR 72 | Temp 97.5°F | Ht 72.05 in | Wt 166.0 lb

## 2022-09-24 DIAGNOSIS — D696 Thrombocytopenia, unspecified: Secondary | ICD-10-CM

## 2022-09-24 DIAGNOSIS — R3129 Other microscopic hematuria: Secondary | ICD-10-CM

## 2022-09-24 DIAGNOSIS — M25511 Pain in right shoulder: Secondary | ICD-10-CM | POA: Diagnosis not present

## 2022-09-24 DIAGNOSIS — D849 Immunodeficiency, unspecified: Secondary | ICD-10-CM

## 2022-09-24 DIAGNOSIS — K219 Gastro-esophageal reflux disease without esophagitis: Secondary | ICD-10-CM | POA: Diagnosis not present

## 2022-09-24 DIAGNOSIS — Z0000 Encounter for general adult medical examination without abnormal findings: Secondary | ICD-10-CM

## 2022-09-24 DIAGNOSIS — M545 Low back pain, unspecified: Secondary | ICD-10-CM | POA: Diagnosis not present

## 2022-09-24 DIAGNOSIS — N401 Enlarged prostate with lower urinary tract symptoms: Secondary | ICD-10-CM | POA: Diagnosis not present

## 2022-09-24 DIAGNOSIS — I10 Essential (primary) hypertension: Secondary | ICD-10-CM | POA: Diagnosis not present

## 2022-09-24 DIAGNOSIS — M25519 Pain in unspecified shoulder: Secondary | ICD-10-CM | POA: Diagnosis not present

## 2022-09-24 DIAGNOSIS — Z0001 Encounter for general adult medical examination with abnormal findings: Secondary | ICD-10-CM

## 2022-09-24 DIAGNOSIS — E1169 Type 2 diabetes mellitus with other specified complication: Secondary | ICD-10-CM | POA: Diagnosis not present

## 2022-09-24 DIAGNOSIS — G8929 Other chronic pain: Secondary | ICD-10-CM | POA: Diagnosis not present

## 2022-09-24 DIAGNOSIS — M316 Other giant cell arteritis: Secondary | ICD-10-CM | POA: Diagnosis not present

## 2022-09-24 DIAGNOSIS — M542 Cervicalgia: Secondary | ICD-10-CM | POA: Diagnosis not present

## 2022-09-24 MED ORDER — PANTOPRAZOLE SODIUM 40 MG PO TBEC
40.0000 mg | DELAYED_RELEASE_TABLET | Freq: Every day | ORAL | 4 refills | Status: DC
Start: 2022-09-24 — End: 2023-11-25

## 2022-09-24 MED ORDER — LOSARTAN POTASSIUM 50 MG PO TABS
50.0000 mg | ORAL_TABLET | Freq: Every day | ORAL | 4 refills | Status: DC
Start: 2022-09-24 — End: 2023-11-25

## 2022-09-24 MED ORDER — ONETOUCH VERIO VI STRP
ORAL_STRIP | 3 refills | Status: AC
Start: 2022-09-24 — End: ?

## 2022-09-24 NOTE — Assessment & Plan Note (Signed)
Chronic, stable period on pantoprazole - continue

## 2022-09-24 NOTE — Assessment & Plan Note (Signed)
Preventative protocols reviewed and updated unless pt declined. Discussed healthy diet and lifestyle.  

## 2022-09-24 NOTE — Progress Notes (Signed)
Ph: [phone] Fax: [phone]   Patient ID: Jonathon Chavez, male    DOB: May 08, 1945, 77 y.o.   MRN: [id number]  This visit was conducted in person.  BP 124/64   Pulse 72   Temp (!) 97.5 F (36.4 C) (Temporal)   Ht 6' 0.05" (1.83 m)   Wt 166 lb (75.3 kg)   SpO2 99%   BMI 22.48 kg/m    CC: CPE Subjective:   HPI: Jonathon Chavez is a 77 y.o. male presenting on 09/24/2022 for Annual Exam (MCR prt 2 [AWV- 09/18/22]. Pt accompanied by wife, [name].)   Saw health advisor last month for medicare wellness visit. Note reviewed.   No results found.  Flowsheet Row Clinical Support from 09/18/2022 in [facility]  PHQ-2 Total Score 0          09/18/2022   10:51 AM 03/26/2022   11:27 AM 01/09/2022   11:02 AM 01/04/2021   10:44 AM 01/01/2020   10:36 AM  Fall Risk   Falls in the past year? 0 0 0 0 0  Number falls in past yr: 0  0 0 0  Injury with Fall? 0  0 0 0  Risk for fall due to : No Fall Risks  No Fall Risks No Fall Risks Medication side effect  Follow up Falls prevention discussed;Falls evaluation completed;Education provided  Falls prevention discussed;Falls evaluation completed Falls prevention discussed Falls evaluation completed;Falls prevention discussed   PMR with temporal arteritis - sees rheumatology on Actemra seeing Q3 mo.   Seeing PM&R for neck pain with radiation to R shoulder thought due to herniated disc at C5/6. Has been referred for physical therapy with exercises and acupuncture without much benefit. Current pain is located to R posterior shoulder. He has been taking gabapentin 300mg  TID without much benefit - planning to stop. Upcoming appt later this week with PM&R  DM - continues metformin 500mg  daily. Home readings running well controlled, peak 135, normally 110s. Occ hypoglycemia.  Lab Results  Component Value Date   HGBA1C 5.8 09/17/2022     Preventative: COLONOSCOPY 10/20/2019 - TA, SSP, severe  diverticulosis, grade IV hem, rpt 5 yrs ([facility]) - rec surgery, pt not currently interested in hemorrhoid treatment  UPPER GASTROINTESTINAL ENDOSCOPY 10/20/2019 - Barrett's, 2cm HH ([facility])  Prostate cancer screening - yearly PSA (brother with prostate cancer), nocturia x2-3.  Lung cancer screening - not eligible Flu shot yearly Tdap 2014  COVID vaccine Pfizer 07/2019, 09/2019, no booster  Pneumovax 2014, prevnar 2015  zostavax - 2012 Shingrix - discussed - to check at local pharmacy  RSV - to get at local pharmacy Advanced directive discussion - No living will. Working on this. Wife would be health care POA. Would accept resuscitation--no prolonged ventilation. Probably no tube feeds if cognitively unaware.  Seat belt use discussed.  Sunscreen use and discussed. No changing moles on skin.  Ex smoker (quit 1975)  Alcohol - none Dentist - overdue  Eye exam - yearly - saw retinologist until released  Bowel - no constipation Bladder - no incontinence  Lives with wife.  Occupation: truck Hospital doctor for [employer] Activity: increased walking - walks dog, physical work  Diet: good water, fruits/vegetables daily      Relevant past medical, surgical, family and social history reviewed and updated as indicated. Interim medical history since our last visit reviewed. Allergies and medications reviewed and updated. Outpatient Medications Prior to Visit  Medication Sig Dispense Refill  ACTEMRA ACTPEN 162 MG/0.9ML SOAJ Inject into the skin.     albuterol (VENTOLIN HFA) 108 (90 Base) MCG/ACT inhaler Inhale 2 puffs into the lungs every 6 (six) hours as needed for wheezing or shortness of breath. 8 g 0   Ascorbic Acid (VITAMIN C) 1000 MG tablet Take 1,000 mg by mouth daily.     Blood Glucose Monitoring Suppl (ONETOUCH VERIO FLEX SYSTEM) w/Device KIT Use as instructed to check blood sugar once a day 1 kit 0   calcium carbonate (OSCAL) 1500 (600 Ca) MG TABS tablet Take by mouth 2 (two) times daily with  a meal.     HYDROcodone-acetaminophen (NORCO/VICODIN) 5-325 MG tablet Take 0.5-1 tablets by mouth 3 (three) times daily as needed for moderate pain. 15 tablet 0   Lancets (ONETOUCH DELICA PLUS LANCET33G) MISC Use as instructed to check blood sugar once a day 100 each 3   gabapentin (NEURONTIN) 100 MG capsule Take 400 mg by mouth at bedtime.     losartan (COZAAR) 50 MG tablet TAKE 1 TABLET BY MOUTH EVERY DAY 90 tablet 3   metFORMIN (GLUCOPHAGE) 500 MG tablet TAKE 1 TABLET BY MOUTH EVERY DAY WITH BREAKFAST 90 tablet 0   ONETOUCH VERIO test strip USE AS INSTRUCTED TO CHECK BLOOD SUGAR ONCE A DAY 100 strip 3   pantoprazole (PROTONIX) 40 MG tablet Take 1 tablet by mouth daily. 90 tablet 3   dorzolamide-timolol (COSOPT) 2-0.5 % ophthalmic solution 1 drop 2 (two) times daily. (Patient not taking: Reported on 09/18/2022)     predniSONE (DELTASONE) 10 MG tablet Take 15 mg by mouth daily with breakfast. Takes 7 mg daily (Patient not taking: Reported on 09/18/2022)     No facility-administered medications prior to visit.     Per HPI unless specifically indicated in ROS section below Review of Systems  Constitutional:  Negative for activity change, appetite change, chills, fatigue, fever and unexpected weight change.  HENT:  Negative for hearing loss.   Eyes:  Negative for visual disturbance.  Respiratory:  Negative for cough, chest tightness, shortness of breath and wheezing.   Cardiovascular:  Negative for chest pain, palpitations and leg swelling.  Gastrointestinal:  Negative for abdominal distention, abdominal pain, blood in stool, constipation, diarrhea, nausea and vomiting.  Genitourinary:  Negative for difficulty urinating and hematuria.  Musculoskeletal:  Positive for arthralgias and back pain. Negative for myalgias and neck pain.  Skin:  Negative for rash.  Neurological:  Negative for dizziness, seizures, syncope and headaches.  Hematological:  Negative for adenopathy. Does not bruise/bleed  easily.  Psychiatric/Behavioral:  Negative for dysphoric mood. The patient is not nervous/anxious.     Objective:  BP 124/64   Pulse 72   Temp (!) 97.5 F (36.4 C) (Temporal)   Ht 6' 0.05" (1.83 m)   Wt 166 lb (75.3 kg)   SpO2 99%   BMI 22.48 kg/m   Wt Readings from Last 3 Encounters:  09/24/22 166 lb (75.3 kg)  09/18/22 168 lb (76.2 kg)  03/26/22 177 lb 8 oz (80.5 kg)      Physical Exam Vitals and nursing note reviewed.  Constitutional:      General: He is not in acute distress.    Appearance: Normal appearance. He is well-developed. He is not ill-appearing.  HENT:     Head: Normocephalic and atraumatic.     Right Ear: Hearing, tympanic membrane, ear canal and external ear normal.     Left Ear: Hearing, tympanic membrane, ear canal and external ear normal.  Nose: Nose normal.     Mouth/Throat:     Mouth: Mucous membranes are moist.     Pharynx: Oropharynx is clear. No oropharyngeal exudate or posterior oropharyngeal erythema.  Eyes:     General: No scleral icterus.    Extraocular Movements: Extraocular movements intact.     Conjunctiva/sclera: Conjunctivae normal.     Pupils: Pupils are equal, round, and reactive to light.  Neck:     Thyroid: No thyroid mass or thyromegaly.     Vascular: No carotid bruit.  Cardiovascular:     Rate and Rhythm: Normal rate and regular rhythm.     Pulses: Normal pulses.          Radial pulses are 2+ on the right side and 2+ on the left side.     Heart sounds: Normal heart sounds. No murmur heard. Pulmonary:     Effort: Pulmonary effort is normal. No respiratory distress.     Breath sounds: Normal breath sounds. No wheezing, rhonchi or rales.  Abdominal:     General: Bowel sounds are normal. There is no distension.     Palpations: Abdomen is soft. There is no mass.     Tenderness: There is no abdominal tenderness. There is no guarding or rebound.     Hernia: No hernia is present.  Musculoskeletal:        General: Normal range of  motion.     Cervical back: Normal range of motion and neck supple.     Right lower leg: No edema.     Left lower leg: No edema.  Lymphadenopathy:     Cervical: No cervical adenopathy.  Skin:    General: Skin is warm and dry.     Findings: No rash.  Neurological:     General: No focal deficit present.     Mental Status: He is alert and oriented to person, place, and time.  Psychiatric:        Mood and Affect: Mood normal.        Behavior: Behavior normal.        Thought Content: Thought content normal.        Judgment: Judgment normal.       Results for orders placed or performed in visit on 09/17/22  Sedimentation rate  Result Value Ref Range   Sed Rate 1 0 - 20 mm/hr  Urinalysis, Routine w reflex microscopic  Result Value Ref Range   Color, Urine YELLOW Yellow;Lt. Yellow;Straw;Dark Yellow;Amber;Green;Red;Brown   APPearance CLEAR Clear;Turbid;Slightly Cloudy;Cloudy   Specific Gravity, Urine >=1.030 (A) 1.000 - 1.030   pH 6.0 5.0 - 8.0   Total Protein, Urine NEGATIVE Negative   Urine Glucose NEGATIVE Negative   Ketones, ur NEGATIVE Negative   Bilirubin Urine NEGATIVE Negative   Hgb urine dipstick NEGATIVE Negative   Urobilinogen, UA 0.2 0.0 - 1.0   Leukocytes,Ua NEGATIVE Negative   Nitrite NEGATIVE Negative   WBC, UA 0-2/hpf 0-2/hpf   RBC / HPF none seen 0-2/hpf   Mucus, UA Presence of (A) None   Squamous Epithelial / HPF Rare(0-4/hpf) Rare(0-4/hpf)   Ca Oxalate Crys, UA Presence of (A) None   Amorphous Present (A) None;Present  PSA  Result Value Ref Range   PSA 2.36 0.10 - 4.00 ng/mL  CBC with Differential/Platelet  Result Value Ref Range   WBC 4.1 4.0 - 10.5 K/uL   RBC 4.46 4.22 - 5.81 Mil/uL   Hemoglobin 13.4 13.0 - 17.0 g/dL   HCT 16.1 09.6 - 04.5 %  MCV 88.3 78.0 - 100.0 fl   MCHC 33.9 30.0 - 36.0 g/dL   RDW 16.1 09.6 - 04.5 %   Platelets 132.0 (L) 150.0 - 400.0 K/uL   Neutrophils Relative % 57.5 43.0 - 77.0 %   Lymphocytes Relative 26.6 12.0 - 46.0 %    Monocytes Relative 11.9 3.0 - 12.0 %   Eosinophils Relative 3.4 0.0 - 5.0 %   Basophils Relative 0.6 0.0 - 3.0 %   Neutro Abs 2.4 1.4 - 7.7 K/uL   Lymphs Abs 1.1 0.7 - 4.0 K/uL   Monocytes Absolute 0.5 0.1 - 1.0 K/uL   Eosinophils Absolute 0.1 0.0 - 0.7 K/uL   Basophils Absolute 0.0 0.0 - 0.1 K/uL  Microalbumin / creatinine urine ratio  Result Value Ref Range   Microalb, Ur 0.8 0.0 - 1.9 mg/dL   Creatinine,U 409.8 mg/dL   Microalb Creat Ratio 0.4 0.0 - 30.0 mg/g  Hemoglobin A1c  Result Value Ref Range   Hgb A1c MFr Bld 5.8 4.6 - 6.5 %  Comprehensive metabolic panel  Result Value Ref Range   Sodium 142 135 - 145 mEq/L   Potassium 3.9 3.5 - 5.1 mEq/L   Chloride 106 96 - 112 mEq/L   CO2 29 19 - 32 mEq/L   Glucose, Bld 116 (H) 70 - 99 mg/dL   BUN 22 6 - 23 mg/dL   Creatinine, Ser 1.19 0.40 - 1.50 mg/dL   Total Bilirubin 0.8 0.2 - 1.2 mg/dL   Alkaline Phosphatase 38 (L) 39 - 117 U/L   AST 16 0 - 37 U/L   ALT 15 0 - 53 U/L   Total Protein 6.1 6.0 - 8.3 g/dL   Albumin 4.0 3.5 - 5.2 g/dL   GFR 14.78 >29.56 mL/min   Calcium 9.0 8.4 - 10.5 mg/dL  Lipid panel  Result Value Ref Range   Cholesterol 175 0 - 200 mg/dL   Triglycerides 21.3 0.0 - 149.0 mg/dL   HDL 08.65 (L) >78.46 mg/dL   VLDL 96.2 0.0 - 95.2 mg/dL   LDL Cholesterol 841 (H) 0 - 99 mg/dL   Total CHOL/HDL Ratio 5    NonHDL 136.46     Assessment & Plan:   Problem List Items Addressed This Visit     Encounter for general adult medical examination with abnormal findings - Primary (Chronic)    Preventative protocols reviewed and updated unless pt declined. Discussed healthy diet and lifestyle.       GERD    Chronic, stable period on pantoprazole - continue      Relevant Medications   pantoprazole (PROTONIX) 40 MG tablet   Benign prostatic hyperplasia    PSA stable.       Dyslipidemia associated with type 2 diabetes mellitus (HCC)    Chronic, stable. Overall improving readings off statin.  The 10-year ASCVD  risk score (Arnett DK, et al., 2019) is: 47.1%   Values used to calculate the score:     Age: 8 years     Sex: Male     Is Non-Hispanic African American: No     Diabetic: Yes     Tobacco smoker: No     Systolic Blood Pressure: 124 mmHg     Is BP treated: Yes     HDL Cholesterol: 38.7 mg/dL     Total Cholesterol: 175 mg/dL       Relevant Medications   losartan (COZAAR) 50 MG tablet   Hypertension, essential    Chronic, stable. Continue losartan.  Relevant Medications   losartan (COZAAR) 50 MG tablet   Chronic right shoulder pain    Ongoing, appreciate PM&R care, has f/u planned later this week      Microhematuria    UA normal today.       Thrombocytopenia (HCC)    Will continue to monitor this.       Temporal arteritis (HCC)    Chronic, stable period on Actemra followed by rheumatology      Relevant Medications   losartan (COZAAR) 50 MG tablet   Type 2 diabetes mellitus with other specified complication (HCC)    Chronic, improved readings off prednisone. Will stop metformin and monitor sugar control off this.       Relevant Medications   losartan (COZAAR) 50 MG tablet   glucose blood (ONETOUCH VERIO) test strip   Immunocompromised state (HCC)    Encouraged he complete RSV vaccine.         Meds ordered this encounter  Medications   losartan (COZAAR) 50 MG tablet    Sig: Take 1 tablet (50 mg total) by mouth daily.    Dispense:  90 tablet    Refill:  4   glucose blood (ONETOUCH VERIO) test strip    Sig: Use as instructed to check blood sugar once a day    Dispense:  100 strip    Refill:  3   pantoprazole (PROTONIX) 40 MG tablet    Sig: Take 1 tablet by mouth daily.    Dispense:  90 tablet    Refill:  4    No orders of the defined types were placed in this encounter.   Patient Instructions  Let's stop metformin. Goal fasting sugar 80-120, goal sugar at other times <180.  If interested, check with pharmacy about new 2 shot shingles series  (shingrix) and RSV.  You are doing well today Return in 6 months for diabetes check   Follow up plan: Return in about 6 months (around 03/27/2023) for follow up visit.  Eustaquio Boyden, MD

## 2022-09-24 NOTE — Assessment & Plan Note (Signed)
PSA stable

## 2022-09-24 NOTE — Assessment & Plan Note (Signed)
Chronic, improved readings off prednisone. Will stop metformin and monitor sugar control off this.

## 2022-09-24 NOTE — Assessment & Plan Note (Signed)
Encouraged he complete RSV vaccine.

## 2022-09-24 NOTE — Patient Instructions (Addendum)
Let's stop metformin. Goal fasting sugar 80-120, goal sugar at other times <180.  If interested, check with pharmacy about new 2 shot shingles series (shingrix) and RSV.  You are doing well today Return in 6 months for diabetes check

## 2022-09-24 NOTE — Assessment & Plan Note (Signed)
Chronic, stable. Continue losartan.  

## 2022-09-24 NOTE — Assessment & Plan Note (Signed)
Ongoing, appreciate PM&R care, has f/u planned later this week

## 2022-09-24 NOTE — Assessment & Plan Note (Signed)
Chronic, stable period on Actemra followed by rheumatology

## 2022-09-24 NOTE — Assessment & Plan Note (Signed)
Chronic, stable. Overall improving readings off statin.  The 10-year ASCVD risk score (Arnett DK, et al., 2019) is: 47.1%   Values used to calculate the score:     Age: 77 years     Sex: Male     Is Non-Hispanic African American: No     Diabetic: Yes     Tobacco smoker: No     Systolic Blood Pressure: 124 mmHg     Is BP treated: Yes     HDL Cholesterol: 38.7 mg/dL     Total Cholesterol: 175 mg/dL

## 2022-09-24 NOTE — Assessment & Plan Note (Signed)
Will continue to monitor this. 

## 2022-09-24 NOTE — Assessment & Plan Note (Addendum)
UA normal today. 

## 2022-09-27 ENCOUNTER — Encounter (INDEPENDENT_AMBULATORY_CARE_PROVIDER_SITE_OTHER): Payer: PPO | Admitting: Ophthalmology

## 2022-09-28 DIAGNOSIS — M5136 Other intervertebral disc degeneration, lumbar region: Secondary | ICD-10-CM | POA: Diagnosis not present

## 2022-09-28 DIAGNOSIS — M5416 Radiculopathy, lumbar region: Secondary | ICD-10-CM | POA: Diagnosis not present

## 2022-09-28 DIAGNOSIS — M25511 Pain in right shoulder: Secondary | ICD-10-CM | POA: Diagnosis not present

## 2022-10-13 ENCOUNTER — Other Ambulatory Visit: Payer: Self-pay | Admitting: Family Medicine

## 2022-10-13 DIAGNOSIS — E1169 Type 2 diabetes mellitus with other specified complication: Secondary | ICD-10-CM

## 2022-11-27 ENCOUNTER — Other Ambulatory Visit: Payer: Self-pay | Admitting: Family Medicine

## 2022-11-27 DIAGNOSIS — E1169 Type 2 diabetes mellitus with other specified complication: Secondary | ICD-10-CM

## 2022-11-29 DIAGNOSIS — Z79899 Other long term (current) drug therapy: Secondary | ICD-10-CM | POA: Diagnosis not present

## 2022-11-29 DIAGNOSIS — M316 Other giant cell arteritis: Secondary | ICD-10-CM | POA: Diagnosis not present

## 2022-12-26 ENCOUNTER — Encounter: Payer: Self-pay | Admitting: Family Medicine

## 2022-12-26 DIAGNOSIS — M316 Other giant cell arteritis: Secondary | ICD-10-CM | POA: Diagnosis not present

## 2022-12-26 DIAGNOSIS — Z961 Presence of intraocular lens: Secondary | ICD-10-CM | POA: Diagnosis not present

## 2022-12-26 DIAGNOSIS — H18513 Endothelial corneal dystrophy, bilateral: Secondary | ICD-10-CM | POA: Diagnosis not present

## 2022-12-26 DIAGNOSIS — H40013 Open angle with borderline findings, low risk, bilateral: Secondary | ICD-10-CM | POA: Diagnosis not present

## 2023-01-10 VITALS — Ht 77.0 in | Wt 177.0 lb

## 2023-01-10 DIAGNOSIS — Z0000 Encounter for general adult medical examination without abnormal findings: Secondary | ICD-10-CM

## 2023-01-10 NOTE — Progress Notes (Signed)
Subjective:   Jonathon Chavez is a 77 y.o. male who presents for Medicare Annual/Subsequent preventive examination.  Visit Complete: Virtual I connected with  Jonathon Chavez on 01/10/23 by a audio enabled telemedicine application and verified that I am speaking with the correct person using two identifiers.  Patient Location: Home  Provider Location: Office/Clinic  I discussed the limitations of evaluation and management by telemedicine. The patient expressed understanding and agreed to proceed.  Vital Signs: Because this visit was a virtual/telehealth visit, some criteria may be missing or patient reported. Any vitals not documented were not able to be obtained and vitals that have been documented are patient reported.  Patient Medicare AWV questionnaire was completed by the patient on 01/10/23; I have confirmed that all information answered by patient is correct and no changes since this date.      Objective:    There were no vitals filed for this visit. There is no height or weight on file to calculate BMI.     09/18/2022   11:00 AM 01/09/2022   11:01 AM 09/18/2021   12:41 PM 07/11/2021    1:31 PM 01/04/2021   10:42 AM 01/01/2020   10:35 AM 08/29/2017   12:20 PM  Advanced Directives  Does Patient Have a Medical Advance Directive? Yes No Yes No Yes Yes No  Type of Estate agent of [place];Living will    Healthcare Power of [place];Living will Healthcare Power of [place];Living will   Does patient want to make changes to medical advance directive?   No - Patient declined  Yes (MAU/Ambulatory/Procedural Areas - Information given)    Copy of Healthcare Power of Attorney in Chart? No - copy requested     No - copy requested   Would patient like information on creating a medical advance directive?  No - Patient declined  No - Patient declined   No - Patient declined    Current Medications (verified) Outpatient Encounter Medications as of 01/10/2023   Medication Sig   ACTEMRA ACTPEN 162 MG/0.9ML SOAJ Inject into the skin.   albuterol (VENTOLIN HFA) 108 (90 Base) MCG/ACT inhaler Inhale 2 puffs into the lungs every 6 (six) hours as needed for wheezing or shortness of breath.   Ascorbic Acid (VITAMIN C) 1000 MG tablet Take 1,000 mg by mouth daily.   Blood Glucose Monitoring Suppl (ONETOUCH VERIO FLEX SYSTEM) w/Device KIT Use as instructed to check blood sugar once a day   calcium carbonate (OSCAL) 1500 (600 Ca) MG TABS tablet Take by mouth 2 (two) times daily with a meal.   glucose blood (ONETOUCH VERIO) test strip Use as instructed to check blood sugar once a day   HYDROcodone-acetaminophen (NORCO/VICODIN) 5-325 MG tablet Take 0.5-1 tablets by mouth 3 (three) times daily as needed for moderate pain.   Lancets (ONETOUCH DELICA PLUS LANCET33G) MISC Use as instructed to check blood sugar once a day   losartan (COZAAR) 50 MG tablet Take 1 tablet (50 mg total) by mouth daily.   pantoprazole (PROTONIX) 40 MG tablet Take 1 tablet by mouth daily.   No facility-administered encounter medications on file as of 01/10/2023.    Allergies (verified) Lisinopril   History: Past Medical History:  Diagnosis Date   BPH (benign prostatic hypertrophy)    GERD (gastroesophageal reflux disease)    Hemorrhoids    Hypertension    Past Surgical History:  Procedure Laterality Date   ARTERY BIOPSY N/A 09/18/2021   Procedure: BIOPSY TEMPORAL ARTERY;  Surgeon: Hazle Quant,  Lurline Idol, MD;  Location: [facility] ORS;  Service: General;  Laterality: N/A;   CATARACT EXTRACTION Bilateral 2018   Dr Ernesto Rutherford   CHOLECYSTECTOMY  8/14   Dr Michela Pitcher   COLONOSCOPY  03/2013   sessile serrated polyp, diverticulosis, rpt 5 yrs (Pyrtle)   COLONOSCOPY  10/20/2019   TA, SSP, severe diverticulosis, grade IV hem (Pyrtle)   UPPER GASTROINTESTINAL ENDOSCOPY  10/20/2019   Barrett's, 2cm HH (Pyrtle)   Family History  Problem Relation Age of Onset   Diabetes Maternal Grandmother     Dementia Father        alz   CAD Father 34       5v CABG   AAA (abdominal aortic aneurysm) Father    CVA Father 31   Heart disease Father    Cancer Mother        ?ovarian cancer   Cancer Brother        esophageal cancer   Esophageal cancer Brother    Cancer Brother 42       prostate cancer   Colon cancer Neg Hx    Colon polyps Neg Hx    Stomach cancer Neg Hx    Rectal cancer Neg Hx    Social History   Socioeconomic History   Marital status: Married    Spouse name: Not on file   Number of children: 1   Years of education: Not on file   Highest education level: Not on file  Occupational History   Occupation: truck Air traffic controller: [employer]  Tobacco Use   Smoking status: Former    Current packs/day: 0.00    Types: Cigarettes    Quit date: 03/25/1973    Years since quitting: 49.8   Smokeless tobacco: Never  Vaping Use   Vaping status: Never Used  Substance and Sexual Activity   Alcohol use: No   Drug use: No   Sexual activity: Not on file  Other Topics Concern   Not on file  Social History Narrative   Lives with wife.    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 - did have agent orange exposure   Activity: walks dog, physical work   Diet: good water, fruits/vegetables daily   Social Determinants of Corporate investment banker Strain: Low Risk  (09/18/2022)   Overall Financial Resource Strain (CARDIA)    Difficulty of Paying Living Expenses: Not hard at all  Food Insecurity: No Food Insecurity (09/18/2022)   Hunger Vital Sign    Worried About Running Out of Food in the Last Year: Never true    Ran Out of Food in the Last Year: Never true  Transportation Needs: No Transportation Needs (09/18/2022)   PRAPARE - Administrator, Civil Service (Medical): No    Lack of Transportation (Non-Medical): No  Physical Activity: Sufficiently Active (09/18/2022)   Exercise Vital Sign    Days of Exercise per  Week: 7 days    Minutes of Exercise per Session: 30 min  Stress: No Stress Concern Present (09/18/2022)   [employer] of Occupational Health - Occupational Stress Questionnaire    Feeling of Stress : Not at all  Social Connections: Socially Integrated (09/18/2022)   Social Connection and Isolation Panel [NHANES]    Frequency of Communication with Friends and Family: More than three times a week    Frequency of Social Gatherings with Friends and Family: More than three times  a week    Attends Religious Services: More than 4 times per year    Active Member of Clubs or Organizations: Yes    Attends Banker Meetings: More than 4 times per year    Marital Status: Married    Tobacco Counseling Counseling given: Not Answered   Clinical Intake:              How often do you need to have someone help you when you read instructions, pamphlets, or other written materials from your doctor or pharmacy?: (P) 1 - Never         Activities of Daily Living    01/10/2023    8:27 AM 09/18/2022   11:00 AM  In your present state of health, do you have any difficulty performing the following activities:  Hearing? 0 0  Vision? 0 0  Difficulty concentrating or making decisions? 0 0  Walking or climbing stairs? 0 0  Dressing or bathing? 0 0  Doing errands, shopping? 0 0  Preparing Food and eating ? N N  Using the Toilet? N N  In the past six months, have you accidently leaked urine? N N  Do you have problems with loss of bowel control? N N  Managing your Medications? N N  Managing your Finances? N N  Housekeeping or managing your Housekeeping? N N    Patient Care Team: Eustaquio Boyden, MD as PCP - General (Family Medicine)  Indicate any recent Medical Services you may have received from other than [facility] providers in the past year (date may be approximate).     Assessment:   This is a routine wellness examination for [place].  Hearing/Vision screen No results  found.   Goals Addressed   None    Depression Screen    09/18/2022   10:58 AM 03/26/2022   11:27 AM 01/09/2022   11:00 AM 01/04/2021   10:45 AM 01/01/2020   10:37 AM 12/25/2018   11:36 AM 08/29/2017   12:19 PM  PHQ 2/9 Scores  PHQ - 2 Score 0 0 0 0 0 0 0  PHQ- 9 Score  4 0  0  0    Fall Risk    01/10/2023    8:27 AM 09/18/2022   10:51 AM 03/26/2022   11:27 AM 01/09/2022   11:02 AM 01/04/2021   10:44 AM  Fall Risk   Falls in the past year? 0 0 0 0 0  Number falls in past yr: 0 0  0 0  Injury with Fall? 0 0  0 0  Risk for fall due to :  No Fall Risks  No Fall Risks No Fall Risks  Follow up  Falls prevention discussed;Falls evaluation completed;Education provided  Falls prevention discussed;Falls evaluation completed Falls prevention discussed    MEDICARE RISK AT HOME: Medicare Risk at Home Any stairs in or around the home?: (P) Yes If so, are there any without handrails?: (P) No Home free of loose throw rugs in walkways, pet beds, electrical cords, etc?: (P) Yes Adequate lighting in your home to reduce risk of falls?: (P) Yes Life alert?: (P) No Use of a cane, walker or w/c?: (P) No Grab bars in the bathroom?: (P) Yes Shower chair or bench in shower?: (P) No Elevated toilet seat or a handicapped toilet?: (P) No  TIMED UP AND GO:  Was the test performed?  No    Cognitive Function:    01/01/2020   10:38 AM 08/29/2017   12:19 PM  MMSE - Mini Mental State Exam  Orientation to time 5 5  Orientation to Place 5 5  Registration 3 3  Attention/ Calculation 5 0  Recall 3 3  Language- name 2 objects  0  Language- repeat 1 1  Language- follow 3 step command  3  Language- read & follow direction  0  Write a sentence  0  Copy design  0  Total score  20        09/18/2022   11:02 AM 01/09/2022   11:02 AM  6CIT Screen  What Year? 0 points 0 points  What month? 0 points 0 points  What time? 0 points 0 points  Count back from 20 0 points 0 points  Months in reverse 0  points 0 points  Repeat phrase 0 points 0 points  Total Score 0 points 0 points    Immunizations Immunization History  Administered Date(s) Administered   Fluad Quad(high Dose 65+) 12/25/2018   Influenza Split 12/24/2012   Influenza, High Dose Seasonal PF 01/04/2023   Influenza-Unspecified 11/30/2013, 11/08/2014, 12/05/2015, 11/22/2016   PFIZER(Purple Top)SARS-COV-2 Vaccination 07/27/2019, 09/22/2019   Pneumococcal Conjugate-13 02/08/2014   Pneumococcal Polysaccharide-23 02/06/2013   Td 02/20/2003   Tdap 02/06/2013   Zoster, Live 08/28/2010    TDAP status: Up to date  Flu Vaccine status: Declined, Education has been provided regarding the importance of this vaccine but patient still declined. Advised may receive this vaccine at local pharmacy or Health Dept. Aware to provide a copy of the vaccination record if obtained from local pharmacy or Health Dept. Verbalized acceptance and understanding.  Pneumococcal vaccine status: Up to date  Covid-19 vaccine status: Completed vaccines  Qualifies for Shingles Vaccine? Yes   Zostavax completed No   Shingrix Completed?: No.    Education has been provided regarding the importance of this vaccine. Patient has been advised to call insurance company to determine out of pocket expense if they have not yet received this vaccine. Advised may also receive vaccine at local pharmacy or Health Dept. Verbalized acceptance and understanding.  Screening Tests Health Maintenance  Topic Date Due   Zoster Vaccines- Shingrix (1 of 2) 04/09/1966   COVID-19 Vaccine (3 - Pfizer risk series) 10/20/2019   OPHTHALMOLOGY EXAM  09/26/2022   FOOT EXAM  12/23/2022   DTaP/Tdap/Td (3 - Td or Tdap) 02/07/2023   HEMOGLOBIN A1C  03/20/2023   Diabetic kidney evaluation - eGFR measurement  09/17/2023   Diabetic kidney evaluation - Urine ACR  09/17/2023   Medicare Annual Wellness (AWV)  09/18/2023   Colonoscopy  10/19/2024   Pneumonia Vaccine 91+ Years old   Completed   INFLUENZA VACCINE  Completed   Hepatitis C Screening  Completed   HPV VACCINES  Aged Out    Health Maintenance  Health Maintenance Due  Topic Date Due   Zoster Vaccines- Shingrix (1 of 2) 04/09/1966   COVID-19 Vaccine (3 - Pfizer risk series) 10/20/2019   OPHTHALMOLOGY EXAM  09/26/2022   FOOT EXAM  12/23/2022    Colorectal cancer screening: Type of screening: Colonoscopy. Completed -. Repeat every - years  Lung Cancer Screening: (Low Dose CT Chest recommended if Age 79-80 years, 20 pack-year currently smoking OR have quit w/in 15years.) does not qualify.   Lung Cancer Screening Referral: no  Additional Screening:  Hepatitis C Screening: does not qualify; Completed -  Vision Screening: Recommended annual ophthalmology exams for early detection of glaucoma and other disorders of the eye. Is the patient up to date with  their annual eye exam?   - Who is the provider or what is the name of the office in which the patient attends annual eye exams? - If pt is not established with a provider, would they like to be referred to a provider to establish care? No .   Dental Screening: Recommended annual dental exams for proper oral hygiene  Diabetic Foot Exam:   Community Resource Referral / Chronic Care Management: CRR required this visit?  No   CCM required this visit?  No     Plan:     I have personally reviewed and noted the following in the patient's chart:   Medical and social history Use of alcohol, tobacco or illicit drugs  Current medications and supplements including opioid prescriptions. Patient is not currently taking opioid prescriptions. Functional ability and status Nutritional status Physical activity Advanced directives List of other physicians Hospitalizations, surgeries, and ER visits in previous 12 months Vitals Screenings to include cognitive, depression, and falls Referrals and appointments  In addition, I have reviewed and discussed  with patient certain preventive protocols, quality metrics, and best practice recommendations. A written personalized care plan for preventive services as well as general preventive health recommendations were provided to patient.     Sue Lush, LPN   [id number]   After Visit Summary: (Declined) Due to this being a telephonic visit, with patients personalized plan was offered to patient but patient Declined AVS at this time   Nurse Notes: It was noted during visit that pt had AWV in July 2024. Visit made erroneous.   This encounter was created in error - please disregard.

## 2023-01-29 DIAGNOSIS — M316 Other giant cell arteritis: Secondary | ICD-10-CM | POA: Diagnosis not present

## 2023-02-06 ENCOUNTER — Encounter: Payer: Self-pay | Admitting: Family Medicine

## 2023-02-06 DIAGNOSIS — H43813 Vitreous degeneration, bilateral: Secondary | ICD-10-CM | POA: Diagnosis not present

## 2023-02-06 DIAGNOSIS — H18513 Endothelial corneal dystrophy, bilateral: Secondary | ICD-10-CM | POA: Diagnosis not present

## 2023-02-06 DIAGNOSIS — M316 Other giant cell arteritis: Secondary | ICD-10-CM | POA: Diagnosis not present

## 2023-02-06 DIAGNOSIS — H33193 Other retinoschisis and retinal cysts, bilateral: Secondary | ICD-10-CM | POA: Diagnosis not present

## 2023-02-06 DIAGNOSIS — Z961 Presence of intraocular lens: Secondary | ICD-10-CM | POA: Diagnosis not present

## 2023-02-06 DIAGNOSIS — H40013 Open angle with borderline findings, low risk, bilateral: Secondary | ICD-10-CM | POA: Diagnosis not present

## 2023-02-06 LAB — HM DIABETES EYE EXAM

## 2023-02-06 NOTE — Telephone Encounter (Signed)
 Care team updated and letter sent for eye exam notes.

## 2023-03-29 ENCOUNTER — Encounter: Payer: Self-pay | Admitting: Family Medicine

## 2023-03-29 ENCOUNTER — Ambulatory Visit (INDEPENDENT_AMBULATORY_CARE_PROVIDER_SITE_OTHER): Payer: PPO | Admitting: Family Medicine

## 2023-03-29 VITALS — BP 126/72 | HR 77 | Temp 97.8°F | Ht 72.5 in | Wt 179.4 lb

## 2023-03-29 DIAGNOSIS — M316 Other giant cell arteritis: Secondary | ICD-10-CM

## 2023-03-29 DIAGNOSIS — R7303 Prediabetes: Secondary | ICD-10-CM

## 2023-03-29 DIAGNOSIS — M353 Polymyalgia rheumatica: Secondary | ICD-10-CM | POA: Insufficient documentation

## 2023-03-29 LAB — POCT GLYCOSYLATED HEMOGLOBIN (HGB A1C): Hemoglobin A1C: 5.9 % — AB (ref 4.0–5.6)

## 2023-03-29 MED ORDER — CALCIUM CARBONATE 1500 (600 CA) MG PO TABS: 600.0000 mg | ORAL_TABLET | Freq: Every day | ORAL | Status: AC

## 2023-03-29 NOTE — Progress Notes (Signed)
 Ph: [phone] Fax: [phone]   Patient ID: Jonathon Chavez, male    DOB: [birth year], 78 y.o.   MRN: [id number]  This visit was conducted in person.  BP 126/72   Pulse 77   Temp 97.8 F (36.6 C) (Oral)   Ht 6' 0.5 (1.842 m)   Wt 179 lb 6 oz (81.4 kg)   SpO2 96%   BMI 23.99 kg/m    CC: 6 mo DM f/u visit  Subjective:   HPI: Jonathon Chavez is a 78 y.o. male presenting on 03/29/2023 for Medical Management of Chronic Issues (Here for 6 mo DM f/u. Pt accompanied by wife, [name]. )   PMR with temporal arteritis folloewd by rheumatology Dr Tobie on Actemra q3wks, now off prednisone . Last seen 01/2023.   Also sees PM&R for R neck/shoulder pain thought due to herniated disc at 5/6. Received R subacromial bursitis injection 09/2022.   Eye doc restarted eye drops for increasing pressure.   Prednisone  induced diabetes - does not regularly check sugars - overall good control. Compliant with antihyperglycemic regimen which includes: diet controlled. Prediabetes range over the past 6 months now off prednisone . Denies low sugars or hypoglycemic symptoms. Denies paresthesias, blurry vision. Last diabetic eye exam 01/2023. Glucometer brand: one touch delica. Last foot exam: DUE. DSME: previously declined. Lab Results  Component Value Date   HGBA1C 5.9 (A) 03/29/2023   Diabetic Foot Exam - Simple   Simple Foot Form Diabetic Foot exam was performed with the following findings: Yes 03/29/2023 10:53 AM  Visual Inspection No deformities, no ulcerations, no other skin breakdown bilaterally: Yes Sensation Testing Intact to touch and monofilament testing bilaterally: Yes Pulse Check Posterior Tibialis and Dorsalis pulse intact bilaterally: Yes Comments No claudication    Lab Results  Component Value Date   MICROALBUR 0.8 09/17/2022         Relevant past medical, surgical, family and social history reviewed and updated as indicated. Interim medical history since our last visit  reviewed. Allergies and medications reviewed and updated. Outpatient Medications Prior to Visit  Medication Sig Dispense Refill   ACTEMRA ACTPEN 162 MG/0.9ML SOAJ Inject into the skin.     albuterol  (VENTOLIN  HFA) 108 (90 Base) MCG/ACT inhaler Inhale 2 puffs into the lungs every 6 (six) hours as needed for wheezing or shortness of breath. 8 g 0   Ascorbic Acid (VITAMIN C) 1000 MG tablet Take 1,000 mg by mouth daily.     Blood Glucose Monitoring Suppl (ONETOUCH VERIO FLEX SYSTEM) w/Device KIT Use as instructed to check blood sugar once a day 1 kit 0   glucose blood (ONETOUCH VERIO) test strip Use as instructed to check blood sugar once a day 100 strip 3   HYDROcodone -acetaminophen  (NORCO/VICODIN) 5-325 MG tablet Take 0.5-1 tablets by mouth 3 (three) times daily as needed for moderate pain. 15 tablet 0   Lancets (ONETOUCH DELICA PLUS LANCET33G) MISC Use as instructed to check blood sugar once a day 100 each 3   latanoprost (XALATAN) 0.005 % ophthalmic solution 1 drop at bedtime.     losartan  (COZAAR ) 50 MG tablet Take 1 tablet (50 mg total) by mouth daily. 90 tablet 4   pantoprazole  (PROTONIX ) 40 MG tablet Take 1 tablet by mouth daily. 90 tablet 4   calcium  carbonate (OSCAL) 1500 (600 Ca) MG TABS tablet Take by mouth 2 (two) times daily with a meal.     No facility-administered medications prior to visit.     Per HPI unless specifically  indicated in ROS section below Review of Systems  Objective:  BP 126/72   Pulse 77   Temp 97.8 F (36.6 C) (Oral)   Ht 6' 0.5 (1.842 m)   Wt 179 lb 6 oz (81.4 kg)   SpO2 96%   BMI 23.99 kg/m   Wt Readings from Last 3 Encounters:  03/29/23 179 lb 6 oz (81.4 kg)  01/10/23 177 lb (80.3 kg)  09/24/22 166 lb (75.3 kg)      Physical Exam Vitals and nursing note reviewed.  Constitutional:      Appearance: Normal appearance. He is not ill-appearing.  HENT:     Head: Normocephalic and atraumatic.     Mouth/Throat:     Mouth: Mucous membranes are  moist.     Pharynx: Oropharynx is clear. No oropharyngeal exudate or posterior oropharyngeal erythema.  Eyes:     Extraocular Movements: Extraocular movements intact.     Conjunctiva/sclera: Conjunctivae normal.     Pupils: Pupils are equal, round, and reactive to light.  Cardiovascular:     Rate and Rhythm: Normal rate and regular rhythm.     Pulses: Normal pulses.     Heart sounds: Normal heart sounds. No murmur heard. Pulmonary:     Effort: Pulmonary effort is normal. No respiratory distress.     Breath sounds: Normal breath sounds. No wheezing, rhonchi or rales.  Musculoskeletal:     Right lower leg: No edema.     Left lower leg: No edema.     Comments: See HPI for foot exam if done  Skin:    General: Skin is warm and dry.     Findings: No rash.  Neurological:     Mental Status: He is alert.  Psychiatric:        Mood and Affect: Mood normal.        Behavior: Behavior normal.       Results for orders placed or performed in visit on 03/29/23  POCT glycosylated hemoglobin (Hb A1C)   Collection Time: 03/29/23 10:34 AM  Result Value Ref Range   Hemoglobin A1C 5.9 (A) 4.0 - 5.6 %   HbA1c POC (<> result, manual entry)     HbA1c, POC (prediabetic range)     HbA1c, POC (controlled diabetic range)      Assessment & Plan:   Problem List Items Addressed This Visit     Temporal arteritis (HCC)   Chronic, quiescent period on actemra appreciate rheum care.       Prediabetes - Primary   Chronic, sugars remaining in prediabetes range over the past 6 months off prednisone . Will change diagnosis from T2DM to prediabetes.       Relevant Orders   POCT glycosylated hemoglobin (Hb A1C) (Completed)   PMR (polymyalgia rheumatica) (HCC)   Appreciate rheum care, on actemra off prednisone         Meds ordered this encounter  Medications   calcium  carbonate (OSCAL) 1500 (600 Ca) MG TABS tablet    Sig: Take 1 tablet (1,500 mg total) by mouth daily with breakfast.    Orders  Placed This Encounter  Procedures   POCT glycosylated hemoglobin (Hb A1C)    Patient Instructions  Sugars are remaining in prediabetes range for the past 6 months - will change diagnosis.  Continue to watch added sugars, portion sizes of simple carb  Return in 6 months for physical /wellness visit   Follow up plan: Return in about 6 months (around 09/26/2023) for medicare wellness visit, annual exam, prior  fasting for blood work.  Anton Blas, MD

## 2023-03-29 NOTE — Patient Instructions (Signed)
 Sugars are remaining in prediabetes range for the past 6 months - will change diagnosis.  Continue to watch added sugars, portion sizes of simple carb  Return in 6 months for physical /wellness visit

## 2023-03-29 NOTE — Assessment & Plan Note (Addendum)
 Chronic, sugars remaining in prediabetes range over the past 6 months off prednisone . Will change diagnosis from T2DM to prediabetes.

## 2023-03-29 NOTE — Assessment & Plan Note (Addendum)
 Chronic, quiescent period on actemra appreciate rheum care.

## 2023-03-29 NOTE — Assessment & Plan Note (Signed)
 Appreciate rheum care, on actemra off prednisone 

## 2023-04-12 DIAGNOSIS — Z79899 Other long term (current) drug therapy: Secondary | ICD-10-CM | POA: Diagnosis not present

## 2023-04-12 DIAGNOSIS — M316 Other giant cell arteritis: Secondary | ICD-10-CM | POA: Diagnosis not present

## 2023-06-27 DIAGNOSIS — Z79899 Other long term (current) drug therapy: Secondary | ICD-10-CM | POA: Diagnosis not present

## 2023-06-27 DIAGNOSIS — M316 Other giant cell arteritis: Secondary | ICD-10-CM | POA: Diagnosis not present

## 2023-06-28 DIAGNOSIS — H43813 Vitreous degeneration, bilateral: Secondary | ICD-10-CM | POA: Diagnosis not present

## 2023-06-28 DIAGNOSIS — H33193 Other retinoschisis and retinal cysts, bilateral: Secondary | ICD-10-CM | POA: Diagnosis not present

## 2023-06-28 DIAGNOSIS — H40013 Open angle with borderline findings, low risk, bilateral: Secondary | ICD-10-CM | POA: Diagnosis not present

## 2023-06-28 DIAGNOSIS — M316 Other giant cell arteritis: Secondary | ICD-10-CM | POA: Diagnosis not present

## 2023-06-28 DIAGNOSIS — H18513 Endothelial corneal dystrophy, bilateral: Secondary | ICD-10-CM | POA: Diagnosis not present

## 2023-09-06 DIAGNOSIS — M316 Other giant cell arteritis: Secondary | ICD-10-CM | POA: Diagnosis not present

## 2023-09-06 DIAGNOSIS — M65331 Trigger finger, right middle finger: Secondary | ICD-10-CM | POA: Diagnosis not present

## 2023-09-24 DIAGNOSIS — Z79899 Other long term (current) drug therapy: Secondary | ICD-10-CM | POA: Diagnosis not present

## 2023-09-30 ENCOUNTER — Ambulatory Visit (INDEPENDENT_AMBULATORY_CARE_PROVIDER_SITE_OTHER)

## 2023-09-30 VITALS — BP 128/75 | HR 81 | Ht 72.0 in | Wt 177.0 lb

## 2023-09-30 DIAGNOSIS — Z0000 Encounter for general adult medical examination without abnormal findings: Secondary | ICD-10-CM | POA: Diagnosis not present

## 2023-09-30 NOTE — Progress Notes (Signed)
 Because this visit was a virtual/telehealth visit,  certain criteria was not obtained, such a blood pressure, CBG if applicable, and timed get up and go. Any medications not marked as taking were not mentioned during the medication reconciliation part of the visit. Any vitals not documented were not able to be obtained due to this being a telehealth visit or patient was unable to self-report a recent blood pressure reading due to a lack of equipment at home via telehealth. Vitals that have been documented are verbally provided by the patient.  This visit was performed by a medical professional under my direct supervision. I was immediately available for consultation/collaboration. I have reviewed and agree with the Annual Wellness Visit documentation.  Subjective:   Jonathon Chavez is a 78 y.o. who presents for a Medicare Wellness preventive visit.  As a reminder, Annual Wellness Visits don't include a physical exam, and some assessments may be limited, especially if this visit is performed virtually. We may recommend an in-person follow-up visit with your provider if needed.  Visit Complete: Virtual I connected with  Jonathon Chavez on 09/30/23 by a audio enabled telemedicine application and verified that I am speaking with the correct person using two identifiers.  Patient Location: Home  Provider Location: Home Office  I discussed the limitations of evaluation and management by telemedicine. The patient expressed understanding and agreed to proceed.  Vital Signs: Because this visit was a virtual/telehealth visit, some criteria may be missing or patient reported. Any vitals not documented were not able to be obtained and vitals that have been documented are patient reported.  VideoDeclined- This patient declined Librarian, academic. Therefore the visit was completed with audio only.  Persons Participating in Visit: Patient.  AWV Questionnaire: No: Patient  Medicare AWV questionnaire was not completed prior to this visit.  Cardiac Risk Factors include: advanced age (>82men, >56 women);male gender;hypertension;dyslipidemia     Objective:    Today's Vitals   09/30/23 1023  BP: 128/75  Pulse: 81  SpO2: 98%  Weight: 177 lb (80.3 kg)  Height: 6' (1.829 m)   Body mass index is 24.01 kg/m.     09/30/2023   10:28 AM 01/10/2023    9:39 AM 09/18/2022   11:00 AM 01/09/2022   11:01 AM 09/18/2021   12:41 PM 07/11/2021    1:31 PM 01/04/2021   10:42 AM  Advanced Directives  Does Patient Have a Medical Advance Directive? Yes Yes Yes No Yes No Yes  Type of Estate agent of [place];Living will Healthcare Power of [place];Living will Healthcare Power of [place];Living will    Healthcare Power of [place];Living will  Does patient want to make changes to medical advance directive? No - Patient declined    No - Patient declined  Yes (MAU/Ambulatory/Procedural Areas - Information given)  Copy of Healthcare Power of Attorney in Chart? No - copy requested No - copy requested No - copy requested      Would patient like information on creating a medical advance directive?    No - Patient declined  No - Patient declined     Current Medications (verified) Outpatient Encounter Medications as of 09/30/2023  Medication Sig   albuterol  (VENTOLIN  HFA) 108 (90 Base) MCG/ACT inhaler Inhale 2 puffs into the lungs every 6 (six) hours as needed for wheezing or shortness of breath.   Ascorbic Acid (VITAMIN C) 1000 MG tablet Take 1,000 mg by mouth daily.   Blood Glucose Monitoring Suppl (ONETOUCH VERIO FLEX  SYSTEM) w/Device KIT Use as instructed to check blood sugar once a day   calcium carbonate (OSCAL) 1500 (600 Ca) MG TABS tablet Take 1 tablet (1,500 mg total) by mouth daily with breakfast.   glucose blood (ONETOUCH VERIO) test strip Use as instructed to check blood sugar once a day   HYDROcodone-acetaminophen (NORCO/VICODIN) 5-325 MG tablet  Take 0.5-1 tablets by mouth 3 (three) times daily as needed for moderate pain.   Lancets (ONETOUCH DELICA PLUS LANCET33G) MISC Use as instructed to check blood sugar once a day   latanoprost (XALATAN) 0.005 % ophthalmic solution 1 drop at bedtime.   losartan (COZAAR) 50 MG tablet Take 1 tablet (50 mg total) by mouth daily.   pantoprazole (PROTONIX) 40 MG tablet Take 1 tablet by mouth daily.   ACTEMRA ACTPEN 162 MG/0.9ML SOAJ Inject into the skin. (Patient not taking: Reported on 09/30/2023)   No facility-administered encounter medications on file as of 09/30/2023.    Allergies (verified) Lisinopril   History: Past Medical History:  Diagnosis Date   BPH (benign prostatic hypertrophy)    GERD (gastroesophageal reflux disease)    Hemorrhoids    Hypertension    Past Surgical History:  Procedure Laterality Date   ARTERY BIOPSY N/A 09/18/2021   Procedure: BIOPSY TEMPORAL ARTERY;  Surgeon: Rodolph Romano, MD;  Location: [facility] ORS;  Service: General;  Laterality: N/A;   CATARACT EXTRACTION Bilateral 2018   Dr Lamar Gaudy   CHOLECYSTECTOMY  8/14   Dr Lorrene   COLONOSCOPY  03/2013   sessile serrated polyp, diverticulosis, rpt 5 yrs (Pyrtle)   COLONOSCOPY  10/20/2019   TA, SSP, severe diverticulosis, grade IV hem (Pyrtle)   UPPER GASTROINTESTINAL ENDOSCOPY  10/20/2019   Barrett's, 2cm HH (Pyrtle)   Family History  Problem Relation Age of Onset   Diabetes Maternal Grandmother    Dementia Father        alz   CAD Father 64       5v CABG   AAA (abdominal aortic aneurysm) Father    CVA Father 92   Heart disease Father    Cancer Mother        ?ovarian cancer   Cancer Brother        esophageal cancer   Esophageal cancer Brother    Cancer Brother 36       prostate cancer   Colon cancer Neg Hx    Colon polyps Neg Hx    Stomach cancer Neg Hx    Rectal cancer Neg Hx    Social History   Socioeconomic History   Marital status: Married    Spouse name: Not on file   Number of  children: 1   Years of education: Not on file   Highest education level: Some college, no degree  Occupational History   Occupation: truck Air traffic controller: [employer]  Tobacco Use   Smoking status: Former    Current packs/day: 0.00    Types: Cigarettes    Quit date: 03/25/1973    Years since quitting: 50.5   Smokeless tobacco: Never  Vaping Use   Vaping status: Never Used  Substance and Sexual Activity   Alcohol use: No   Drug use: No   Sexual activity: Not on file  Other Topics Concern   Not on file  Social History Narrative   Lives with wife.    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 - did have  agent orange exposure   Activity: walks dog, physical work   Diet: good water, fruits/vegetables daily   Social Drivers of Corporate investment banker Strain: Low Risk  (09/30/2023)   Overall Financial Resource Strain (CARDIA)    Difficulty of Paying Living Expenses: Not hard at all  Food Insecurity: No Food Insecurity (09/30/2023)   Hunger Vital Sign    Worried About Running Out of Food in the Last Year: Never true    Ran Out of Food in the Last Year: Never true  Transportation Needs: No Transportation Needs (09/30/2023)   PRAPARE - Administrator, Civil Service (Medical): No    Lack of Transportation (Non-Medical): No  Physical Activity: Insufficiently Active (09/30/2023)   Exercise Vital Sign    Days of Exercise per Week: 2 days    Minutes of Exercise per Session: 40 min  Stress: No Stress Concern Present (09/30/2023)   [employer] of Occupational Health - Occupational Stress Questionnaire    Feeling of Stress: Not at all  Social Connections: Socially Integrated (09/30/2023)   Social Connection and Isolation Panel    Frequency of Communication with Friends and Family: More than three times a week    Frequency of Social Gatherings with Friends and Family: More than three times a week    Attends  Religious Services: More than 4 times per year    Active Member of [employer] or Organizations: Yes    Attends Engineer, structural: More than 4 times per year    Marital Status: Married    Tobacco Counseling Counseling given: Not Answered    Clinical Intake:  Pre-visit preparation completed: Yes  Pain : No/denies pain     BMI - recorded: 24.01 Nutritional Status: BMI of 19-24  Normal Nutritional Risks: None Diabetes: No  Lab Results  Component Value Date   HGBA1C 5.9 (A) 03/29/2023   HGBA1C 5.8 09/17/2022   HGBA1C 6.0 (A) 03/26/2022     How often do you need to have someone help you when you read instructions, pamphlets, or other written materials from your doctor or pharmacy?: 1 - Never What is the last grade level you completed in school?: 2 years of college  Interpreter Needed?: No  Information entered by :: Kyser Wandel,CMA   Activities of Daily Living     09/30/2023   10:27 AM 01/10/2023    8:27 AM  In your present state of health, do you have any difficulty performing the following activities:  Hearing? 0 0  Vision? 0 0  Difficulty concentrating or making decisions? 0 0  Walking or climbing stairs? 0 0  Dressing or bathing? 0 0  Doing errands, shopping? 0 0  Preparing Food and eating ? N N  Using the Toilet? N N  In the past six months, have you accidently leaked urine? N N  Do you have problems with loss of bowel control? N N  Managing your Medications? N N  Managing your Finances? N N  Housekeeping or managing your Housekeeping? N N    Patient Care Team: Rilla Baller, MD as PCP - General (Family Medicine) [facility] (Ophthalmology)  I have updated your Care Teams any recent Medical Services you may have received from other providers in the past year.     Assessment:   This is a routine wellness examination for [place].  Hearing/Vision screen Hearing Screening - Comments:: No difficulties hearing  Vision  Screening - Comments:: Patient wears glasses  Goals Addressed             This Visit's Progress    Increase physical activity   On track    Starting 08/29/2017, I will continue to walk 30-60 minutes daily.        Depression Screen     09/30/2023   10:28 AM 01/10/2023    9:37 AM 09/18/2022   10:58 AM 03/26/2022   11:27 AM 01/09/2022   11:00 AM 01/04/2021   10:45 AM 01/01/2020   10:37 AM  PHQ 2/9 Scores  PHQ - 2 Score 0 0 0 0 0 0 0  PHQ- 9 Score 1   4 0  0    Fall Risk     09/30/2023   10:27 AM 01/10/2023    8:27 AM 09/18/2022   10:51 AM 03/26/2022   11:27 AM 01/09/2022   11:02 AM  Fall Risk   Falls in the past year? 0 0 0 0 0  Number falls in past yr: 0 0 0  0  Injury with Fall? 0 0 0  0  Risk for fall due to : No Fall Risks No Fall Risks No Fall Risks  No Fall Risks  Follow up Falls evaluation completed Education provided;Falls prevention discussed Falls prevention discussed;Falls evaluation completed;Education provided  Falls prevention discussed;Falls evaluation completed      Data saved with a previous flowsheet row definition    MEDICARE RISK AT HOME:  Medicare Risk at Home Any stairs in or around the home?: Yes If so, are there any without handrails?: No Home free of loose throw rugs in walkways, pet beds, electrical cords, etc?: Yes Adequate lighting in your home to reduce risk of falls?: Yes Life alert?: No Use of a cane, walker or w/c?: No Grab bars in the bathroom?: Yes Shower chair or bench in shower?: Yes Elevated toilet seat or a handicapped toilet?: Yes  TIMED UP AND GO:  Was the test performed?  No  Cognitive Function: 6CIT completed    01/01/2020   10:38 AM 08/29/2017   12:19 PM  MMSE - Mini Mental State Exam  Orientation to time 5 5  Orientation to Place 5 5  Registration 3 3  Attention/ Calculation 5 0  Recall 3 3  Language- name 2 objects  0  Language- repeat 1 1  Language- follow 3 step command  3  Language- read & follow  direction  0  Write a sentence  0  Copy design  0  Total score  20        09/30/2023   10:25 AM 09/18/2022   11:02 AM 01/09/2022   11:02 AM  6CIT Screen  What Year? 0 points 0 points 0 points  What month? 0 points 0 points 0 points  What time? 0 points 0 points 0 points  Count back from 20 0 points 0 points 0 points  Months in reverse 0 points 0 points 0 points  Repeat phrase 0 points 0 points 0 points  Total Score 0 points 0 points 0 points    Immunizations Immunization History  Administered Date(s) Administered   Fluad Quad(high Dose 65+) 12/25/2018   Influenza Split 12/24/2012   Influenza, High Dose Seasonal PF 01/04/2023   Influenza-Unspecified 11/30/2013, 11/08/2014, 12/05/2015, 11/22/2016   PFIZER(Purple Top)SARS-COV-2 Vaccination 07/27/2019, 09/22/2019   Pneumococcal Conjugate-13 02/08/2014   Pneumococcal Polysaccharide-23 02/06/2013   Td 02/20/2003   Tdap 02/06/2013   Zoster, Live 08/28/2010    Screening Tests Health Maintenance  Topic  Date Due   Diabetic kidney evaluation - Urine ACR  Never done   Zoster Vaccines- Shingrix (1 of 2) 04/09/1966   COVID-19 Vaccine (3 - Pfizer risk series) 10/20/2019   DTaP/Tdap/Td (3 - Td or Tdap) 02/07/2023   Diabetic kidney evaluation - eGFR measurement  09/17/2023   INFLUENZA VACCINE  09/20/2023   HEMOGLOBIN A1C  09/26/2023   OPHTHALMOLOGY EXAM  02/06/2024   FOOT EXAM  03/28/2024   Medicare Annual Wellness (AWV)  09/29/2024   Colonoscopy  10/19/2024   Pneumococcal Vaccine: 50+ Years  Completed   Hepatitis C Screening  Completed   Hepatitis B Vaccines  Aged Out   HPV VACCINES  Aged Out   Meningococcal B Vaccine  Aged Out    Health Maintenance  Health Maintenance Due  Topic Date Due   Diabetic kidney evaluation - Urine ACR  Never done   Zoster Vaccines- Shingrix (1 of 2) 04/09/1966   COVID-19 Vaccine (3 - Pfizer risk series) 10/20/2019   DTaP/Tdap/Td (3 - Td or Tdap) 02/07/2023   Diabetic kidney evaluation - eGFR  measurement  09/17/2023   INFLUENZA VACCINE  09/20/2023   HEMOGLOBIN A1C  09/26/2023   Health Maintenance Items Addressed:patient declined  Additional Screening:  Vision Screening: Recommended annual ophthalmology exams for early detection of glaucoma and other disorders of the eye. Would you like a referral to an eye doctor? No    Dental Screening: Recommended annual dental exams for proper oral hygiene  Community Resource Referral / Chronic Care Management: CRR required this visit?  No   CCM required this visit?  No   Plan:    I have personally reviewed and noted the following in the patient's chart:   Medical and social history Use of alcohol, tobacco or illicit drugs  Current medications and supplements including opioid prescriptions. Patient is not currently taking opioid prescriptions. Functional ability and status Nutritional status Physical activity Advanced directives List of other physicians Hospitalizations, surgeries, and ER visits in previous 12 months Vitals Screenings to include cognitive, depression, and falls Referrals and appointments  In addition, I have reviewed and discussed with patient certain preventive protocols, quality metrics, and best practice recommendations. A written personalized care plan for preventive services as well as general preventive health recommendations were provided to patient.   Jonathon Chavez, [place]   09/30/2023   After Visit Summary: (MyChart) Due to this being a telephonic visit, the after visit summary with patients personalized plan was offered to patient via MyChart   Notes: Nothing significant to report at this time.

## 2023-10-17 DIAGNOSIS — Z79899 Other long term (current) drug therapy: Secondary | ICD-10-CM | POA: Diagnosis not present

## 2023-11-14 IMAGING — DX DG CERVICAL SPINE COMPLETE 4+V
5 series · 5 of 5 positions shown · non-contrast
Comparison: None Available.

CLINICAL DATA: Severe neck pain for 3 weeks without inciting
trauma.

EXAM:
CERVICAL SPINE - COMPLETE 4+ VIEW; CERVICAL SPINE COMPLETE WITH
FLEXION AND EXTENSION VIEWS

[cervical spine ap]
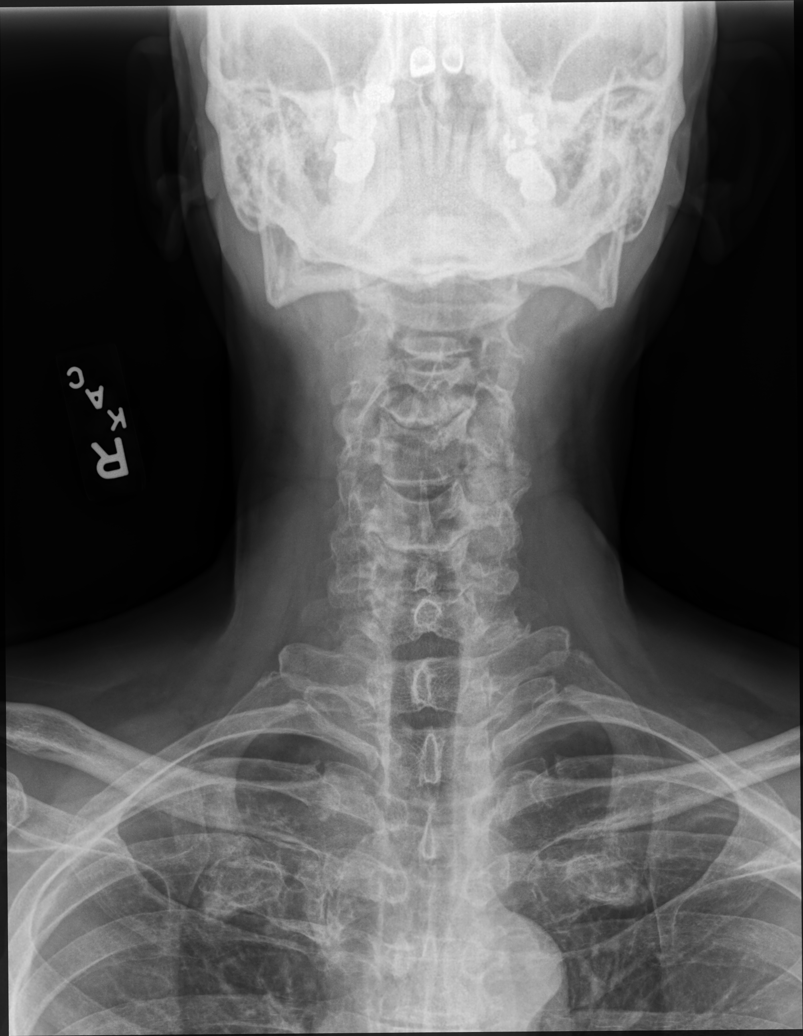

[cervical spine ap open mouth]
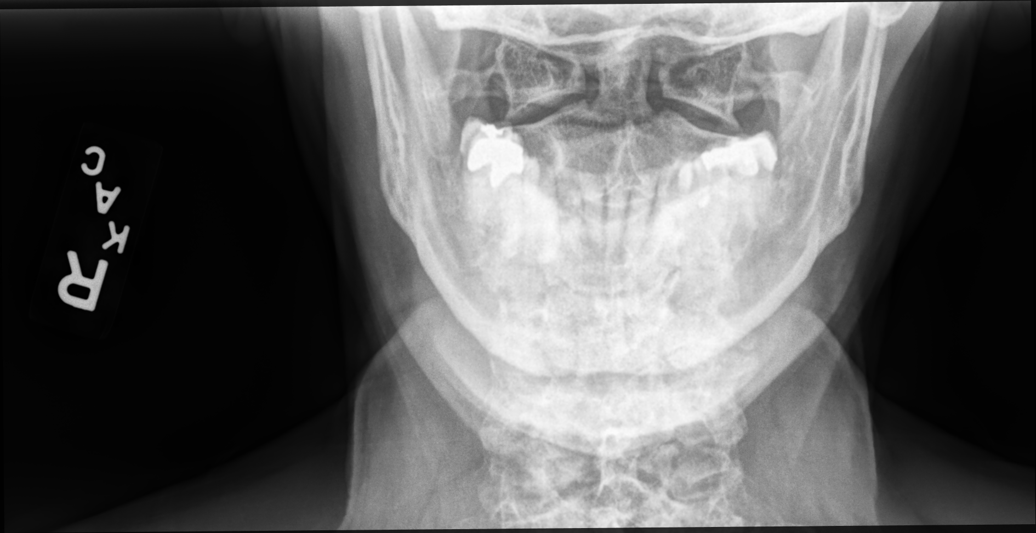

[cervical spine lat]
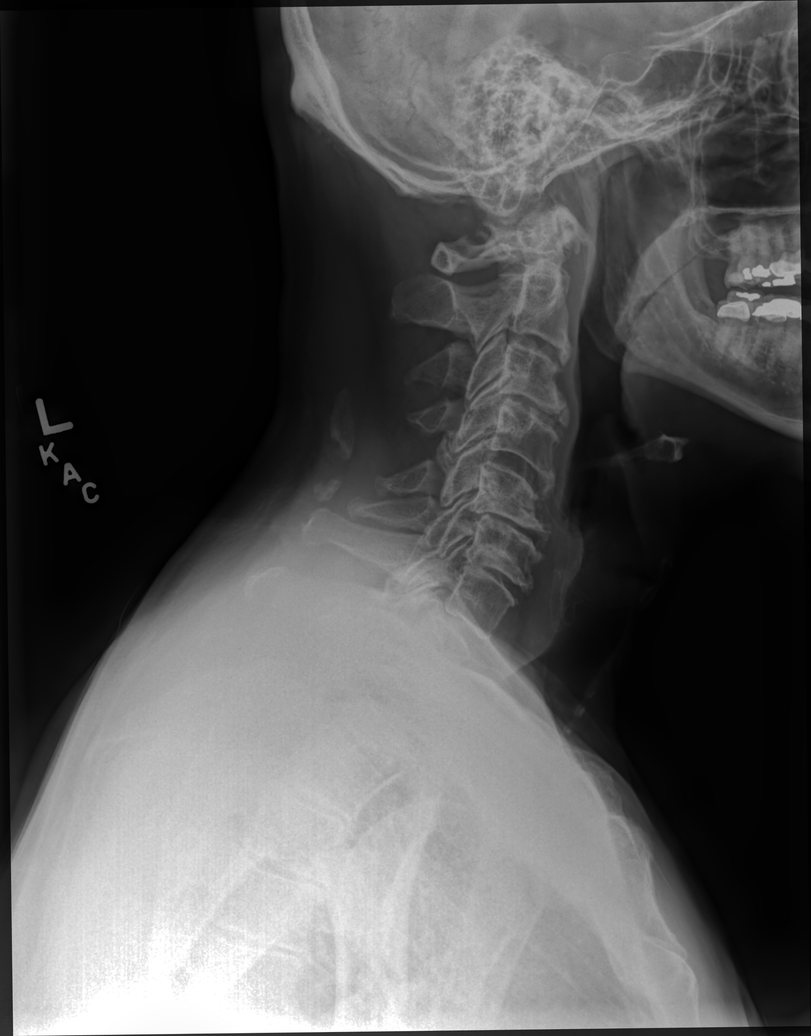

[cervical spine oblique lmo (1 of 2)]
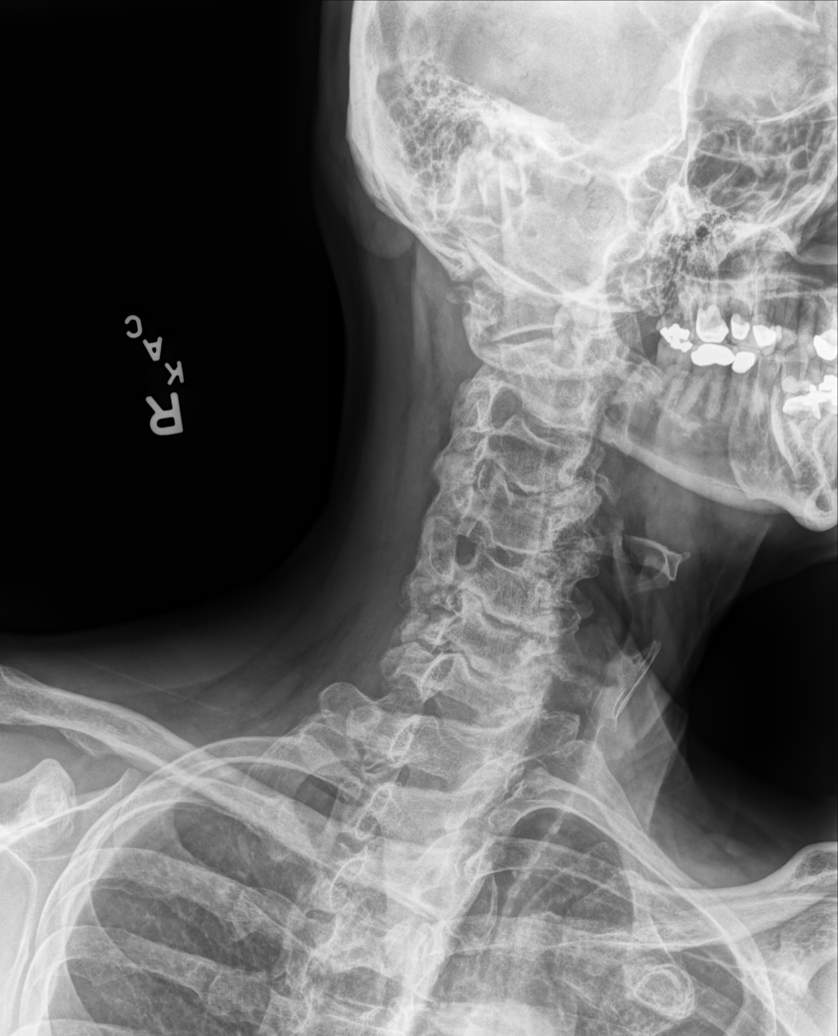

[cervical spine oblique lmo (2 of 2)]
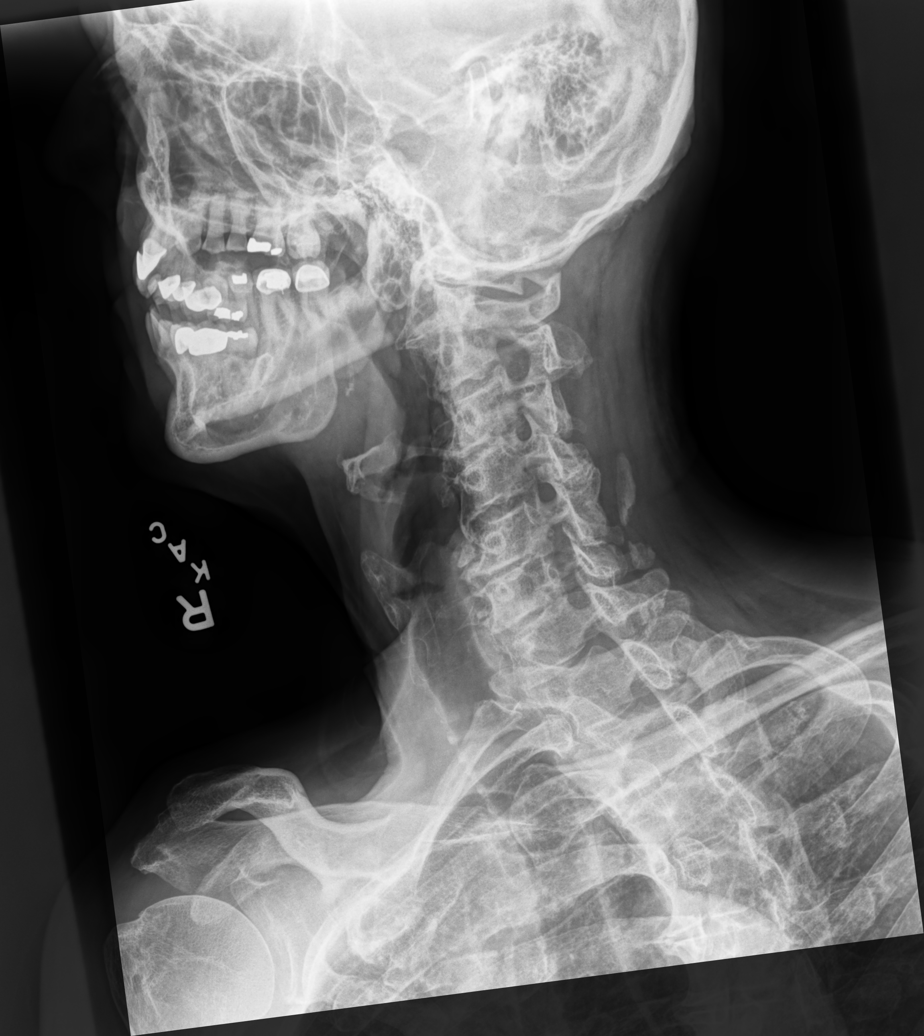

[5 of 5 positions shown; findings below may reference images not displayed]

FINDINGS: There is straightening of the normal cervical lordosis. Minimal
retrolisthesis of C3 on C4 and C5 on C6 reduce with flexion. Minimal
anterolisthesis of C4 on C5 does not significantly change with
flexion or extension. No acute fracture is identified. Disc space
narrowing is severe at C5-6 and C6-7 and moderate at C3-4.
Degenerative endplate spurring is present throughout the cervical
spine. There is potentially severe right-sided osseous neural
foraminal stenosis at C3-4 and C5-6, and mild left-sided osseous
neural foraminal stenosis is evident at C3-4 and C4-5. Multilevel
facet arthrosis appears most severe on the left at C4-5. The
prevertebral soft tissues are within normal limits.
IMPRESSION: 1. No acute osseous abnormality identified.
2. Advanced disc and facet degeneration with multilevel listhesis
and neural foraminal narrowing as above.

## 2023-11-24 ENCOUNTER — Other Ambulatory Visit: Payer: Self-pay | Admitting: Family Medicine

## 2023-11-24 DIAGNOSIS — I10 Essential (primary) hypertension: Secondary | ICD-10-CM

## 2023-11-24 DIAGNOSIS — K219 Gastro-esophageal reflux disease without esophagitis: Secondary | ICD-10-CM

## 2023-11-25 NOTE — Telephone Encounter (Signed)
 I spoked with patient and he has been scheduled

## 2023-11-25 NOTE — Telephone Encounter (Signed)
 E-scribed refills.  Pls schedule annual exam and fasting labs (no food/drink- except water and/or blk coffee 5 hrs prior) for additional refills.

## 2023-12-04 DIAGNOSIS — L814 Other melanin hyperpigmentation: Secondary | ICD-10-CM | POA: Diagnosis not present

## 2023-12-04 DIAGNOSIS — L821 Other seborrheic keratosis: Secondary | ICD-10-CM | POA: Diagnosis not present

## 2023-12-04 DIAGNOSIS — D1801 Hemangioma of skin and subcutaneous tissue: Secondary | ICD-10-CM | POA: Diagnosis not present

## 2023-12-04 DIAGNOSIS — L80 Vitiligo: Secondary | ICD-10-CM | POA: Diagnosis not present

## 2024-01-18 ENCOUNTER — Other Ambulatory Visit: Payer: Self-pay | Admitting: Family Medicine

## 2024-01-18 DIAGNOSIS — R7303 Prediabetes: Secondary | ICD-10-CM

## 2024-01-18 DIAGNOSIS — E785 Hyperlipidemia, unspecified: Secondary | ICD-10-CM

## 2024-01-18 DIAGNOSIS — D696 Thrombocytopenia, unspecified: Secondary | ICD-10-CM

## 2024-01-18 DIAGNOSIS — N401 Enlarged prostate with lower urinary tract symptoms: Secondary | ICD-10-CM

## 2024-01-18 DIAGNOSIS — M316 Other giant cell arteritis: Secondary | ICD-10-CM

## 2024-01-20 ENCOUNTER — Ambulatory Visit: Payer: Self-pay | Admitting: Family Medicine

## 2024-01-20 ENCOUNTER — Other Ambulatory Visit (INDEPENDENT_AMBULATORY_CARE_PROVIDER_SITE_OTHER)

## 2024-01-20 DIAGNOSIS — D696 Thrombocytopenia, unspecified: Secondary | ICD-10-CM | POA: Diagnosis not present

## 2024-01-20 DIAGNOSIS — R351 Nocturia: Secondary | ICD-10-CM

## 2024-01-20 DIAGNOSIS — E785 Hyperlipidemia, unspecified: Secondary | ICD-10-CM | POA: Diagnosis not present

## 2024-01-20 DIAGNOSIS — N401 Enlarged prostate with lower urinary tract symptoms: Secondary | ICD-10-CM | POA: Diagnosis not present

## 2024-01-20 DIAGNOSIS — R7303 Prediabetes: Secondary | ICD-10-CM | POA: Diagnosis not present

## 2024-01-20 DIAGNOSIS — M316 Other giant cell arteritis: Secondary | ICD-10-CM

## 2024-01-20 LAB — SEDIMENTATION RATE: Sed Rate: 34 mm/h — ABNORMAL HIGH (ref 0–20)

## 2024-01-20 LAB — CBC WITH DIFFERENTIAL/PLATELET
Basophils Absolute: 0 K/uL (ref 0.0–0.1)
Basophils Relative: 0.8 % (ref 0.0–3.0)
Eosinophils Absolute: 0.1 K/uL (ref 0.0–0.7)
Eosinophils Relative: 2.4 % (ref 0.0–5.0)
HCT: 39.5 % (ref 39.0–52.0)
Hemoglobin: 13.1 g/dL (ref 13.0–17.0)
Lymphocytes Relative: 24.1 % (ref 12.0–46.0)
Lymphs Abs: 1.2 K/uL (ref 0.7–4.0)
MCHC: 33.3 g/dL (ref 30.0–36.0)
MCV: 81.8 fl (ref 78.0–100.0)
Monocytes Absolute: 0.4 K/uL (ref 0.1–1.0)
Monocytes Relative: 8 % (ref 3.0–12.0)
Neutro Abs: 3.3 K/uL (ref 1.4–7.7)
Neutrophils Relative %: 64.7 % (ref 43.0–77.0)
Platelets: 204 K/uL (ref 150.0–400.0)
RBC: 4.83 Mil/uL (ref 4.22–5.81)
RDW: 14.2 % (ref 11.5–15.5)
WBC: 5.2 K/uL (ref 4.0–10.5)

## 2024-01-20 LAB — COMPREHENSIVE METABOLIC PANEL WITH GFR
ALT: 9 U/L (ref 0–53)
AST: 14 U/L (ref 0–37)
Albumin: 4.2 g/dL (ref 3.5–5.2)
Alkaline Phosphatase: 45 U/L (ref 39–117)
BUN: 23 mg/dL (ref 6–23)
CO2: 31 meq/L (ref 19–32)
Calcium: 9.3 mg/dL (ref 8.4–10.5)
Chloride: 103 meq/L (ref 96–112)
Creatinine, Ser: 0.93 mg/dL (ref 0.40–1.50)
GFR: 79.61 mL/min (ref >60.00)
Glucose, Bld: 143 mg/dL — ABNORMAL HIGH (ref 70–99)
Potassium: 4 meq/L (ref 3.5–5.1)
Sodium: 141 meq/L (ref 135–145)
Total Bilirubin: 0.7 mg/dL (ref 0.2–1.2)
Total Protein: 7.2 g/dL (ref 6.0–8.3)

## 2024-01-20 LAB — HEMOGLOBIN A1C: Hgb A1c MFr Bld: 6.4 % (ref 4.6–6.5)

## 2024-01-20 LAB — LIPID PANEL
Cholesterol: 161 mg/dL (ref 0–200)
HDL: 34.5 mg/dL — ABNORMAL LOW (ref >39.00)
LDL Cholesterol: 111 mg/dL — ABNORMAL HIGH (ref 0–99)
NonHDL: 126.24
Total CHOL/HDL Ratio: 5
Triglycerides: 77 mg/dL (ref 0.0–149.0)
VLDL: 15.4 mg/dL (ref 0.0–40.0)

## 2024-01-20 LAB — PSA: PSA: 6.86 ng/mL — ABNORMAL HIGH (ref 0.10–4.00)

## 2024-01-27 ENCOUNTER — Encounter: Payer: Self-pay | Admitting: Family Medicine

## 2024-01-27 ENCOUNTER — Ambulatory Visit: Admitting: Family Medicine

## 2024-01-27 VITALS — BP 130/80 | HR 77 | Temp 98.5°F | Ht 72.0 in | Wt 181.6 lb

## 2024-01-27 DIAGNOSIS — R7303 Prediabetes: Secondary | ICD-10-CM

## 2024-01-27 DIAGNOSIS — M25511 Pain in right shoulder: Secondary | ICD-10-CM

## 2024-01-27 DIAGNOSIS — K219 Gastro-esophageal reflux disease without esophagitis: Secondary | ICD-10-CM

## 2024-01-27 DIAGNOSIS — Z23 Encounter for immunization: Secondary | ICD-10-CM

## 2024-01-27 DIAGNOSIS — Z7189 Other specified counseling: Secondary | ICD-10-CM

## 2024-01-27 DIAGNOSIS — R972 Elevated prostate specific antigen [PSA]: Secondary | ICD-10-CM | POA: Insufficient documentation

## 2024-01-27 DIAGNOSIS — I10 Essential (primary) hypertension: Secondary | ICD-10-CM

## 2024-01-27 DIAGNOSIS — M316 Other giant cell arteritis: Secondary | ICD-10-CM

## 2024-01-27 DIAGNOSIS — D696 Thrombocytopenia, unspecified: Secondary | ICD-10-CM

## 2024-01-27 DIAGNOSIS — Z0001 Encounter for general adult medical examination with abnormal findings: Secondary | ICD-10-CM

## 2024-01-27 DIAGNOSIS — M353 Polymyalgia rheumatica: Secondary | ICD-10-CM

## 2024-01-27 DIAGNOSIS — N401 Enlarged prostate with lower urinary tract symptoms: Secondary | ICD-10-CM

## 2024-01-27 DIAGNOSIS — E785 Hyperlipidemia, unspecified: Secondary | ICD-10-CM

## 2024-01-27 MED ORDER — PANTOPRAZOLE SODIUM 40 MG PO TBEC: 40.0000 mg | DELAYED_RELEASE_TABLET | Freq: Every day | ORAL | 3 refills | Status: AC

## 2024-01-27 MED ORDER — PREDNISONE 20 MG PO TABS: 20.0000 mg | ORAL_TABLET | Freq: Every day | ORAL | 0 refills | Status: DC

## 2024-01-27 MED ORDER — LOSARTAN POTASSIUM 50 MG PO TABS: 50.0000 mg | ORAL_TABLET | Freq: Every day | ORAL | 3 refills | Status: AC

## 2024-01-27 NOTE — Assessment & Plan Note (Signed)
Chronic, stable. Continue losartan 50mg daily.  

## 2024-01-27 NOTE — Assessment & Plan Note (Signed)
 Advanced directive discussion - has this completed at home - asked to bring us  copy. Wife would be health care POA. Would accept resuscitation--no prolonged ventilation. Probably no tube feeds if cognitively unaware.

## 2024-01-27 NOTE — Assessment & Plan Note (Signed)
Continue daily pantoprazole.  °

## 2024-01-27 NOTE — Assessment & Plan Note (Signed)
 Concern for recurrent PMR - see above.  Will also provide with exercises from [facility] pt advisor on RTC injury.  Update with effect.

## 2024-01-27 NOTE — Assessment & Plan Note (Signed)
 PSA trending up - pt notes LUTS.  Check urinalysis.  Refer to urology.

## 2024-01-27 NOTE — Progress Notes (Signed)
 Ph: [phone] Fax: [phone]   Patient ID: Jonathon Chavez, male    DOB: 08-May-1945, 78 y.o.   MRN: [id number]  This visit was conducted in person.  BP 130/80 (Cuff Size: Normal)   Pulse 77   Temp 98.5 F (36.9 C) (Oral)   Ht 6' (1.829 m)   Wt 181 lb 9.6 oz (82.4 kg)   SpO2 95%   BMI 24.63 kg/m    CC: CPE Subjective:   HPI: Jonathon Chavez is a 78 y.o. male presenting on 01/27/2024 for Annual Exam (Bilateral shoulder pain, onset 2 months right side, with left side starting a month later, /Pt was using leaf vac to get leaves up and noticed pain start after, being worse on right side, takes ibuprofen / tylenol  PRN//Also has mole on right side of ear he would like checked, thinks the mole has been getting larger in last 6 months)   Saw health advisor 09/2023 for medicare wellness visit. Note reviewed.   No results found.  Flowsheet Row Office Visit from 01/27/2024 in [facility] [place]  PHQ-2 Total Score 1       01/27/2024    4:17 PM 09/30/2023   10:27 AM 01/10/2023    8:27 AM 09/18/2022   10:51 AM 03/26/2022   11:27 AM  Fall Risk   Falls in the past year? 0 0 0 0 0  Number falls in past yr: 0 0 0 0   Injury with Fall? 0 0  0  0    Risk for fall due to :  No Fall Risks No Fall Risks No Fall Risks   Follow up Falls evaluation completed Falls evaluation completed Education provided;Falls prevention discussed Falls prevention discussed;Falls evaluation completed;Education provided      Data saved with a previous flowsheet row definition    PMR with temporal arteritis - sees rheumatology, stopped Actemra almost 1 year ago.  on Actemra seeing Q3 mo.  Several months of R>L shoulder pain associated with increase in fatigue, but no headaches or vision changes. May have started after using Leaf-Vac to pick up leaves for 3-4 hours at a time several days in a row. Some left hip discomfort over the past few days.   Saw PM&R for neck pain with  radiation to R shoulder thought due to herniated disc at C5/6. Has been referred for physical therapy with exercises and acupuncture without much benefit. Last seen 12/2023   H/o steroid induced diabetes now off metformin  500mg  daily. He limits added sugar in diet. Not checking sugars at home.  Lab Results  Component Value Date   HGBA1C 6.4 01/20/2024    Preventative: COLONOSCOPY 10/20/2019 - TA, SSP, severe diverticulosis, grade IV hem, rpt 5 yrs ([facility]) - rec surgery, pt not currently interested in hemorrhoid treatment  UPPER GASTROINTESTINAL ENDOSCOPY 10/20/2019 - Barrett's, 2cm HH ([facility])  Prostate cancer screening - yearly PSA (brother with prostate cancer), nocturia x2-3.  Lung cancer screening - not eligible Flu shot yearly Tdap 2014  COVID vaccine Pfizer 07/2019, 09/2019, no booster  Pneumovax 2014, prevnar 2015, Prevnar-20 today zostavax - 2012 Shingrix - discussed - to check at local pharmacy  RSV - to get at local pharmacy Advanced directive discussion - has this completed at home - asked to bring us  copy. Wife would be health care POA. Would accept resuscitation--no prolonged ventilation. Probably no tube feeds if cognitively unaware.  Seat belt use discussed.  Sunscreen use and discussed. No changing moles  on skin.  Ex smoker - quit 1975) Alcohol - none Dentist - yearly  Eye exam - q37mo - has seen retinologist until released  Bowel - no constipation Bladder - no incontinence   Lives with wife.  Occupation: truck hospital doctor for [employer] Activity: increased walking - walks dog, physical work  Diet: good water, fruits/vegetables daily      Relevant past medical, surgical, family and social history reviewed and updated as indicated. Interim medical history since our last visit reviewed. Allergies and medications reviewed and updated. Outpatient Medications Prior to Visit  Medication Sig Dispense Refill   Ascorbic Acid (VITAMIN C) 1000 MG tablet Take 1,000 mg by mouth  daily.     Blood Glucose Monitoring Suppl (ONETOUCH VERIO FLEX SYSTEM) w/Device KIT Use as instructed to check blood sugar once a day 1 kit 0   calcium  carbonate (OSCAL) 1500 (600 Ca) MG TABS tablet Take 1 tablet (1,500 mg total) by mouth daily with breakfast.     glucose blood (ONETOUCH VERIO) test strip Use as instructed to check blood sugar once a day 100 strip 3   Lancets (ONETOUCH DELICA PLUS LANCET33G) MISC Use as instructed to check blood sugar once a day 100 each 3   latanoprost (XALATAN) 0.005 % ophthalmic solution 1 drop at bedtime.     Zinc 30 MG TABS Take 30 mg by mouth daily.     losartan  (COZAAR ) 50 MG tablet TAKE 1 TABLET BY MOUTH EVERY DAY 90 tablet 0   pantoprazole  (PROTONIX ) 40 MG tablet TAKE 1 TABLET BY MOUTH EVERY DAY 90 tablet 0   ACTEMRA ACTPEN 162 MG/0.9ML SOAJ Inject into the skin. (Patient not taking: Reported on 01/27/2024)     albuterol  (VENTOLIN  HFA) 108 (90 Base) MCG/ACT inhaler Inhale 2 puffs into the lungs every 6 (six) hours as needed for wheezing or shortness of breath. (Patient not taking: Reported on 01/27/2024) 8 g 0   HYDROcodone -acetaminophen  (NORCO/VICODIN) 5-325 MG tablet Take 0.5-1 tablets by mouth 3 (three) times daily as needed for moderate pain. (Patient not taking: Reported on 01/27/2024) 15 tablet 0   No facility-administered medications prior to visit.     Per HPI unless specifically indicated in ROS section below Review of Systems  Constitutional:  Negative for activity change, appetite change, chills, fatigue, fever and unexpected weight change.  HENT:  Negative for hearing loss.   Eyes:  Negative for visual disturbance.  Respiratory:  Negative for cough, chest tightness, shortness of breath and wheezing.   Cardiovascular:  Negative for chest pain, palpitations and leg swelling.  Gastrointestinal:  Negative for abdominal distention, abdominal pain, blood in stool, constipation, diarrhea, nausea and vomiting.  Genitourinary:  Negative for  difficulty urinating and hematuria.  Musculoskeletal:  Positive for arthralgias (shoulders). Negative for myalgias and neck pain.  Skin:  Negative for rash.  Neurological:  Negative for dizziness, seizures, syncope and headaches.  Hematological:  Negative for adenopathy. Does not bruise/bleed easily.  Psychiatric/Behavioral:  Negative for dysphoric mood. The patient is not nervous/anxious.     Objective:  BP 130/80 (Cuff Size: Normal)   Pulse 77   Temp 98.5 F (36.9 C) (Oral)   Ht 6' (1.829 m)   Wt 181 lb 9.6 oz (82.4 kg)   SpO2 95%   BMI 24.63 kg/m   Wt Readings from Last 3 Encounters:  01/27/24 181 lb 9.6 oz (82.4 kg)  09/30/23 177 lb (80.3 kg)  03/29/23 179 lb 6 oz (81.4 kg)      Physical  Exam Vitals and nursing note reviewed.  Constitutional:      General: He is not in acute distress.    Appearance: Normal appearance. He is well-developed. He is not ill-appearing.  HENT:     Head: Normocephalic and atraumatic.      Comments: Flesh colored pruritic papule anterior to R ear    Right Ear: Hearing, tympanic membrane, ear canal and external ear normal.     Left Ear: Hearing, tympanic membrane, ear canal and external ear normal.     Mouth/Throat:     Mouth: Mucous membranes are moist.     Pharynx: Oropharynx is clear. No oropharyngeal exudate or posterior oropharyngeal erythema.  Eyes:     General: No scleral icterus.    Extraocular Movements: Extraocular movements intact.     Conjunctiva/sclera: Conjunctivae normal.     Pupils: Pupils are equal, round, and reactive to light.  Neck:     Thyroid : No thyroid  mass or thyromegaly.  Cardiovascular:     Rate and Rhythm: Normal rate and regular rhythm.     Pulses: Normal pulses.          Radial pulses are 2+ on the right side and 2+ on the left side.     Heart sounds: Normal heart sounds. No murmur heard. Pulmonary:     Effort: Pulmonary effort is normal. No respiratory distress.     Breath sounds: Normal breath sounds. No  wheezing, rhonchi or rales.  Abdominal:     General: Bowel sounds are normal. There is no distension.     Palpations: Abdomen is soft. There is no mass.     Tenderness: There is no abdominal tenderness. There is no guarding or rebound.     Hernia: No hernia is present.  Musculoskeletal:        General: Normal range of motion.     Cervical back: Normal range of motion and neck supple.     Right lower leg: No edema.     Left lower leg: No edema.     Comments:  Bilateral shoulder exam: No deformity of shoulders on inspection.  Discomfort with palpation of posterior and lateral shoulder landmarks.  FROM in abduction and forward flexion, discomfort with palpation. + pain with testing SITS in ext/int rotation. No significant pain with empty can sign. Neg Speed test. No impingement. Discomfort with rotation of humeral head in [facility] joint.   Lymphadenopathy:     Cervical: No cervical adenopathy.  Skin:    General: Skin is warm and dry.     Findings: No rash.  Neurological:     General: No focal deficit present.     Mental Status: He is alert and oriented to person, place, and time.  Psychiatric:        Mood and Affect: Mood normal.        Behavior: Behavior normal.        Thought Content: Thought content normal.        Judgment: Judgment normal.       Results for orders placed or performed in visit on 01/20/24  Sedimentation rate   Collection Time: 01/20/24  8:14 AM  Result Value Ref Range   Sed Rate 34 (H) 0 - 20 mm/hr  CBC with Differential/Platelet   Collection Time: 01/20/24  8:14 AM  Result Value Ref Range   WBC 5.2 4.0 - 10.5 K/uL   RBC 4.83 4.22 - 5.81 Mil/uL   Hemoglobin 13.1 13.0 - 17.0 g/dL   HCT 60.4 60.9 -  52.0 %   MCV 81.8 78.0 - 100.0 fl   MCHC 33.3 30.0 - 36.0 g/dL   RDW 85.7 88.4 - 84.4 %   Platelets 204.0 150.0 - 400.0 K/uL   Neutrophils Relative % 64.7 43.0 - 77.0 %   Lymphocytes Relative 24.1 12.0 - 46.0 %   Monocytes Relative 8.0 3.0 - 12.0 %    Eosinophils Relative 2.4 0.0 - 5.0 %   Basophils Relative 0.8 0.0 - 3.0 %   Neutro Abs 3.3 1.4 - 7.7 K/uL   Lymphs Abs 1.2 0.7 - 4.0 K/uL   Monocytes Absolute 0.4 0.1 - 1.0 K/uL   Eosinophils Absolute 0.1 0.0 - 0.7 K/uL   Basophils Absolute 0.0 0.0 - 0.1 K/uL  PSA   Collection Time: 01/20/24  8:14 AM  Result Value Ref Range   PSA 6.86 (H) 0.10 - 4.00 ng/mL  Hemoglobin A1c   Collection Time: 01/20/24  8:14 AM  Result Value Ref Range   Hgb A1c MFr Bld 6.4 4.6 - 6.5 %  Comprehensive metabolic panel with GFR   Collection Time: 01/20/24  8:14 AM  Result Value Ref Range   Sodium 141 135 - 145 mEq/L   Potassium 4.0 3.5 - 5.1 mEq/L   Chloride 103 96 - 112 mEq/L   CO2 31 19 - 32 mEq/L   Glucose, Bld 143 (H) 70 - 99 mg/dL   BUN 23 6 - 23 mg/dL   Creatinine, Ser 9.06 0.40 - 1.50 mg/dL   Total Bilirubin 0.7 0.2 - 1.2 mg/dL   Alkaline Phosphatase 45 39 - 117 U/L   AST 14 0 - 37 U/L   ALT 9 0 - 53 U/L   Total Protein 7.2 6.0 - 8.3 g/dL   Albumin 4.2 3.5 - 5.2 g/dL   GFR 20.38 >39.99 mL/min   Calcium  9.3 8.4 - 10.5 mg/dL  Lipid panel   Collection Time: 01/20/24  8:14 AM  Result Value Ref Range   Cholesterol 161 0 - 200 mg/dL   Triglycerides 22.9 0.0 - 149.0 mg/dL   HDL 65.49 (L) >60.99 mg/dL   VLDL 84.5 0.0 - 59.9 mg/dL   LDL Cholesterol 888 (H) 0 - 99 mg/dL   Total CHOL/HDL Ratio 5    NonHDL 126.24    Lab Results  Component Value Date   PSA 6.86 (H) 01/20/2024   PSA 2.36 09/17/2022   PSA 2.02 09/13/2021        01/27/2024    4:17 PM 09/30/2023   10:28 AM 01/10/2023    9:37 AM 09/18/2022   10:58 AM 03/26/2022   11:27 AM  Depression screen PHQ 2/9  Decreased Interest 1 0 0 0 0  Down, Depressed, Hopeless 0 0 0 0 0  PHQ - 2 Score 1 0 0 0 0  Altered sleeping 3 1   1   Tired, decreased energy 3 0   2  Change in appetite 0 0   1  Feeling bad or failure about yourself  2 0   0  Trouble concentrating 2 0   0  Moving slowly or fidgety/restless 0 0   0  Suicidal thoughts 0 0   0   PHQ-9 Score 11 1    4    Difficult doing work/chores Not difficult at all Not difficult at all   Not difficult at all     Data saved with a previous flowsheet row definition       01/27/2024    4:18 PM 03/26/2022  11:27 AM  GAD 7 : Generalized Anxiety Score  Nervous, Anxious, on Edge 0 0  Control/stop worrying 1 0  Worry too much - different things 1 0  Trouble relaxing 1 0  Restless 3 0  Easily annoyed or irritable 0 0  Afraid - awful might happen 0 0  Total GAD 7 Score 6 0  Anxiety Difficulty Not difficult at all    Assessment & Plan:   Problem List Items Addressed This Visit     Encounter for general adult medical examination with abnormal findings - Primary (Chronic)   Preventative protocols reviewed and updated unless pt declined. Discussed healthy diet and lifestyle.       Advanced care planning/counseling discussion (Chronic)   Advanced directive discussion - has this completed at home - asked to bring us  copy. Wife would be health care POA. Would accept resuscitation--no prolonged ventilation. Probably no tube feeds if cognitively unaware.       GERD   Continue daily pantoprazole        Relevant Medications   pantoprazole  (PROTONIX ) 40 MG tablet   Benign prostatic hyperplasia   PSA trending up - pt notes LUTS.  Check urinalysis.  Refer to urology.       Relevant Orders   Urinalysis, Routine w reflex microscopic   Dyslipidemia   Chronic, stable off medication.  Reviewed diet choices to improve cholesterol levels. The 10-year ASCVD risk score (Arnett DK, et al., 2019) is: 52.7%   Values used to calculate the score:     Age: 67 years     Clincally relevant sex: Male     Is Non-Hispanic African American: No     Diabetic: Yes     Tobacco smoker: No     Systolic Blood Pressure: 130 mmHg     Is BP treated: Yes     HDL Cholesterol: 34.5 mg/dL     Total Cholesterol: 161 mg/dL       Hypertension, essential   Chronic, stable. Continue losartan  50mg  daily.        Relevant Medications   losartan  (COZAAR ) 50 MG tablet   Bilateral shoulder pain   Concern for recurrent PMR - see above.  Will also provide with exercises from [facility] pt advisor on RTC injury.  Update with effect.      Thrombocytopenia   Levels improved today.       Giant cell arteritis syndrome (HCC)   Followed by [facility] ,actemra stopped earlier this year.  No recurrence of headache or vision changes at this time.       Relevant Medications   losartan  (COZAAR ) 50 MG tablet   Prediabetes   Chronic, A1c trending up - reviewed diet choices to improve range.       PMR (polymyalgia rheumatica)   Concern for recurring symptoms with increasing R>L shoulder pain.  Treat with prednisone  20mg  daily burst - if significant improvement, will recommend sooner rheum f/u. Recent ESR mildly elevated at 34.       Elevated PSA   PSA up from 2 to 6 Fmhx prostate cancer (brother) Check UA today.  Will refer to urology for further evaluation - pt agrees DRE deferred today.       Relevant Orders   Ambulatory referral to Urology   Other Visit Diagnoses       Encounter for immunization       Relevant Orders   Pneumococcal conjugate vaccine 20-valent (Prevnar 20) (Completed)        Meds ordered  this encounter  Medications   losartan  (COZAAR ) 50 MG tablet    Sig: Take 1 tablet (50 mg total) by mouth daily.    Dispense:  90 tablet    Refill:  3   pantoprazole  (PROTONIX ) 40 MG tablet    Sig: Take 1 tablet (40 mg total) by mouth daily.    Dispense:  90 tablet    Refill:  3   predniSONE  (DELTASONE ) 20 MG tablet    Sig: Take 1 tablet (20 mg total) by mouth daily with breakfast.    Dispense:  5 tablet    Refill:  0    Orders Placed This Encounter  Procedures   Pneumococcal conjugate vaccine 20-valent (Prevnar 20)   Urinalysis, Routine w reflex microscopic   Ambulatory referral to Urology    Referral Priority:   Routine    Referral Type:   Consultation     Referral Reason:   Specialty Services Required    Requested Specialty:   Urology    Number of Visits Requested:   1    Patient Instructions  Get flu shot at local pharmacy.  Prevnar-20 today  If interested, check with pharmacy about new 2 shot shingles series (shingrix) as well as RSV shot.  Prostate test returned elevated. Urinalysis today. I will refer you to the urologist in [place].  For shoulder pain - try prednisone  burst 20mg  daily for 5 days. If marked improvement, would recommend calling rheumatology for follow up visit. May also try exercises provided today on rotator cuff injury.  Good to see you today.  Return as needed or in 4-6 months for follow up visit.   Follow up plan: Return in about 4 months (around 05/27/2024) for follow up visit.  Anton Blas, MD

## 2024-01-27 NOTE — Assessment & Plan Note (Signed)
 Followed by [facility]ology ,actemra stopped earlier this year.  No recurrence of headache or vision changes at this time.

## 2024-01-27 NOTE — Assessment & Plan Note (Addendum)
 Concern for recurring symptoms with increasing R>L shoulder pain.  Treat with prednisone  20mg  daily burst - if significant improvement, will recommend sooner rheum f/u. Recent ESR mildly elevated at 34.

## 2024-01-27 NOTE — Assessment & Plan Note (Signed)
 Levels improved today.

## 2024-01-27 NOTE — Assessment & Plan Note (Signed)
 Chronic, A1c trending up - reviewed diet choices to improve range.

## 2024-01-27 NOTE — Assessment & Plan Note (Signed)
 Preventative protocols reviewed and updated unless pt declined. Discussed healthy diet and lifestyle.

## 2024-01-27 NOTE — Assessment & Plan Note (Addendum)
 PSA up from 2 to 6 Fmhx prostate cancer (brother) Check UA today.  Will refer to urology for further evaluation - pt agrees DRE deferred today.

## 2024-01-27 NOTE — Patient Instructions (Addendum)
 Get flu shot at local pharmacy.  Prevnar-20 today  If interested, check with pharmacy about new 2 shot shingles series (shingrix) as well as RSV shot.  Prostate test returned elevated. Urinalysis today. I will refer you to the urologist in [place].  For shoulder pain - try prednisone  burst 20mg  daily for 5 days. If marked improvement, would recommend calling rheumatology for follow up visit. May also try exercises provided today on rotator cuff injury.  Good to see you today.  Return as needed or in 4-6 months for follow up visit.

## 2024-01-27 NOTE — Assessment & Plan Note (Signed)
 Chronic, stable off medication.  Reviewed diet choices to improve cholesterol levels. The 10-year ASCVD risk score (Arnett DK, et al., 2019) is: 52.7%   Values used to calculate the score:     Age: 78 years     Clincally relevant sex: Male     Is Non-Hispanic African American: No     Diabetic: Yes     Tobacco smoker: No     Systolic Blood Pressure: 130 mmHg     Is BP treated: Yes     HDL Cholesterol: 34.5 mg/dL     Total Cholesterol: 161 mg/dL

## 2024-01-28 ENCOUNTER — Ambulatory Visit: Payer: Self-pay | Admitting: Family Medicine

## 2024-01-28 LAB — URINALYSIS, ROUTINE W REFLEX MICROSCOPIC
Bilirubin Urine: NEGATIVE
Hgb urine dipstick: NEGATIVE
Leukocytes,Ua: NEGATIVE
Nitrite: NEGATIVE
Specific Gravity, Urine: >=1.03 — AB (ref 1.000–1.030)
Total Protein, Urine: NEGATIVE
Urine Glucose: NEGATIVE
Urobilinogen, UA: 0.2 (ref 0.0–1.0)
pH: 6 (ref 5.0–8.0)

## 2024-02-18 ENCOUNTER — Encounter: Payer: Self-pay | Admitting: Urology

## 2024-02-18 ENCOUNTER — Ambulatory Visit: Admitting: Urology

## 2024-02-18 VITALS — BP 122/74 | HR 95 | Ht 72.0 in | Wt 177.0 lb

## 2024-02-18 DIAGNOSIS — R972 Elevated prostate specific antigen [PSA]: Secondary | ICD-10-CM

## 2024-02-18 NOTE — Progress Notes (Unsigned)
 "  02/18/2024 7:29 AM   Jonathon Chavez [birth year] [id number]  Referring provider: Rilla Baller, MD [address] [place],  [place] [address]  Chief Complaint  Patient presents with   Elevated PSA    HPI: Jonathon Chavez is a 78 y.o. male referred for evaluation of an elevated PSA.  PSA levels: 01/20/2024 was 6.86 Baseline PSA level: See below Lower urinary tract symptoms: Nocturia for several years Family history prostate cancer first-degree relatives: Brother who is 4 years younger diagnosed with prostate cancer 8-10 years ago and underwent radical prostatectomy Past urologic history: Negative Urinalysis 01/28/2024 was negative   PSA trend    Prostate Specific Ag, Serum  Latest Ref Rng 0.0 - 4.0 ng/mL  12/18/2018 3.62  06/25/2019 2.24  01/26/2020 3.15  09/13/2021 2.02  09/17/2022 2.36  01/20/2024 6.86    PMH: Past Medical History:  Diagnosis Date   Arthritis    BPH (benign prostatic hypertrophy)    COVID-19 virus infection 03/26/2022   GERD (gastroesophageal reflux disease)    Hemorrhoids    Hypertension     Surgical History: Past Surgical History:  Procedure Laterality Date   ARTERY BIOPSY N/A 09/18/2021   Procedure: BIOPSY TEMPORAL ARTERY;  Surgeon: Rodolph Romano, MD;  Location: [facility] ORS;  Service: General;  Laterality: N/A;   CATARACT EXTRACTION Bilateral 2018   Dr Lamar Gaudy   CHOLECYSTECTOMY  09/2012   Dr Lorrene   COLONOSCOPY  03/2013   sessile serrated polyp, diverticulosis, rpt 5 yrs ([facility])   COLONOSCOPY  10/20/2019   TA, SSP, severe diverticulosis, grade IV hem ([facility])   EYE SURGERY  cataracts 2018   UPPER GASTROINTESTINAL ENDOSCOPY  10/20/2019   Barrett's, 2cm HH ([facility])    Home Medications:  Allergies as of 02/18/2024       Reactions   Lisinopril  Cough   Dry cough        Medication List        Accurate as of February 18, 2024  7:29 AM. If you have any questions, ask your nurse or doctor.          calcium   carbonate 1500 (600 Ca) MG Tabs tablet Commonly known as: OSCAL Take 1 tablet (1,500 mg total) by mouth daily with breakfast.   latanoprost 0.005 % ophthalmic solution Commonly known as: XALATAN 1 drop at bedtime.   losartan  50 MG tablet Commonly known as: COZAAR  Take 1 tablet (50 mg total) by mouth daily.   OneTouch Delica Plus Lancet33G Misc Use as instructed to check blood sugar once a day   OneTouch Verio Flex System w/Device Kit Use as instructed to check blood sugar once a day   OneTouch Verio test strip Generic drug: glucose blood Use as instructed to check blood sugar once a day   pantoprazole  40 MG tablet Commonly known as: PROTONIX  Take 1 tablet (40 mg total) by mouth daily.   predniSONE  20 MG tablet Commonly known as: DELTASONE  Take 1 tablet (20 mg total) by mouth daily with breakfast.   vitamin C 1000 MG tablet Take 1,000 mg by mouth daily.   Zinc 30 MG Tabs Take 30 mg by mouth daily.        Allergies: Allergies[1]  Family History: Family History  Problem Relation Age of Onset   Diabetes Maternal Grandmother    Dementia Father        alz   CAD Father 79       5v CABG   AAA (abdominal aortic aneurysm) Father  CVA Father 44   Heart disease Father    Arthritis Father    Cancer Mother        ?ovarian cancer   Cancer Brother        esophageal cancer   Esophageal cancer Brother    Cancer Brother 32       prostate cancer   Heart disease Brother    Colon cancer Neg Hx    Colon polyps Neg Hx    Stomach cancer Neg Hx    Rectal cancer Neg Hx     Social History:  reports that he quit smoking about 50 years ago. His smoking use included cigarettes. He has never used smokeless tobacco. He reports that he does not currently use alcohol. He reports that he does not use drugs.   Physical Exam: There were no vitals taken for this visit.  Constitutional:  Alert and oriented, No acute distress. HEENT: [place] AT Respiratory: Normal respiratory effort,  no increased work of breathing. GU: Prostate 35 g, smooth without nodules or induration Psychiatric: Normal mood and affect. .   Assessment & Plan:    1.  Elevated PSA Although PSA is a prostate cancer screening test he was informed that cancer is not the most common cause of an elevated PSA. Other potential causes including BPH and inflammation were discussed.  He was informed that the only way to adequately diagnose prostate cancer would be transrectal ultrasound and biopsy of the prostate. The procedure was discussed including potential risks of bleeding and infection/sepsis. He was also informed that a negative biopsy does not conclusively rule out the possibility that prostate cancer may be present and that continued monitoring is required.  The use of newer adjunctive blood and urine tests to predict the probability of high-grade prostate cancer were discussed. The use of multiparametric prostate MRI to evaluate for lesions suspicious for high grade prostate cancer and aid in targeted bx was reviewed.  Continued periodic surveillance was also discussed. We discussed transient PSA elevation secondary to inflammation are fairly common and have initially recommended a repeat PSA (PHI ordered) If PSA remains elevated recommend prostate MRI    Jonathon JAYSON Barba, MD  [facility] [address] [place], [place] [address] [phone]     [1]  Allergies Allergen Reactions   Lisinopril  Cough    Dry cough   "

## 2024-02-20 LAB — PHI SCORE REFLEX
% Free PSA: 16.1 %
PSA, Free: 1.12 ng/mL
Prostate Heath Index Score: 36.1
p2PSA: 15.3 pg/mL

## 2024-02-20 LAB — PROSTATE HEALTH INDEX: Prostate Specific Ag: 6.9 ng/mL — ABNORMAL HIGH (ref 0.0–3.9)

## 2024-02-21 ENCOUNTER — Ambulatory Visit: Payer: Self-pay | Admitting: Urology

## 2024-02-21 DIAGNOSIS — R972 Elevated prostate specific antigen [PSA]: Secondary | ICD-10-CM

## 2024-02-28 ENCOUNTER — Ambulatory Visit
Admission: RE | Admit: 2024-02-28 | Discharge: 2024-02-28 | Disposition: A | Source: Ambulatory Visit | Attending: Urology | Admitting: Urology

## 2024-02-28 DIAGNOSIS — R972 Elevated prostate specific antigen [PSA]: Secondary | ICD-10-CM | POA: Insufficient documentation

## 2024-02-28 MED ORDER — GADOBUTROL 1 MMOL/ML IV SOLN
8.0000 mL | Freq: Once | INTRAVENOUS | Status: AC | PRN
  Administered 2024-02-28: 8 mL via INTRAVENOUS

## 2024-03-01 ENCOUNTER — Ambulatory Visit: Payer: Self-pay | Admitting: Urology

## 2024-03-26 NOTE — Progress Notes (Signed)
 Jonathon Chavez                                          MRN: [id number]   03/26/2024   The [facility] Quality Team Specialist reviewed this patient medical record for the purposes of chart review for care gap closure. The following were reviewed: chart review for care gap closure-kidney health evaluation for diabetes:eGFR  and uACR.    [facility] Quality Team

## 2024-08-31 ENCOUNTER — Other Ambulatory Visit

## 2024-09-02 ENCOUNTER — Ambulatory Visit: Admitting: Urology

## 2024-09-30 ENCOUNTER — Ambulatory Visit
